# Patient Record
Sex: Male | Born: 1970
Health system: Southern US, Community
[De-identification: ages and names within clinical notes are randomized; demographics above are authoritative.]

## PROBLEM LIST (undated history)

## (undated) DIAGNOSIS — K5790 Diverticulosis of intestine, part unspecified, without perforation or abscess without bleeding: Secondary | ICD-10-CM

## (undated) DIAGNOSIS — G4733 Obstructive sleep apnea (adult) (pediatric): Secondary | ICD-10-CM

## (undated) DIAGNOSIS — I4891 Unspecified atrial fibrillation: Secondary | ICD-10-CM

## (undated) DIAGNOSIS — K449 Diaphragmatic hernia without obstruction or gangrene: Secondary | ICD-10-CM

## (undated) DIAGNOSIS — F419 Anxiety disorder, unspecified: Secondary | ICD-10-CM

## (undated) DIAGNOSIS — G8929 Other chronic pain: Secondary | ICD-10-CM

## (undated) DIAGNOSIS — D126 Benign neoplasm of colon, unspecified: Secondary | ICD-10-CM

## (undated) DIAGNOSIS — Z8719 Personal history of other diseases of the digestive system: Secondary | ICD-10-CM

## (undated) DIAGNOSIS — I2699 Other pulmonary embolism without acute cor pulmonale: Secondary | ICD-10-CM

## (undated) DIAGNOSIS — K219 Gastro-esophageal reflux disease without esophagitis: Secondary | ICD-10-CM

## (undated) DIAGNOSIS — G4730 Sleep apnea, unspecified: Secondary | ICD-10-CM

## (undated) DIAGNOSIS — K589 Irritable bowel syndrome without diarrhea: Secondary | ICD-10-CM

## (undated) DIAGNOSIS — M542 Cervicalgia: Secondary | ICD-10-CM

## (undated) DIAGNOSIS — T7840XA Allergy, unspecified, initial encounter: Secondary | ICD-10-CM

## (undated) DIAGNOSIS — M67912 Unspecified disorder of synovium and tendon, left shoulder: Secondary | ICD-10-CM

## (undated) DIAGNOSIS — M722 Plantar fascial fibromatosis: Secondary | ICD-10-CM

## (undated) DIAGNOSIS — M1990 Unspecified osteoarthritis, unspecified site: Secondary | ICD-10-CM

## (undated) DIAGNOSIS — M549 Dorsalgia, unspecified: Secondary | ICD-10-CM

## (undated) DIAGNOSIS — N200 Calculus of kidney: Secondary | ICD-10-CM

## (undated) HISTORY — DX: Calculus of kidney: N20.0

## (undated) HISTORY — DX: Diaphragmatic hernia without obstruction or gangrene: K44.9

## (undated) HISTORY — PX: SHOULDER ARTHROSCOPY: SHX128

## (undated) HISTORY — DX: Cervicalgia: M54.2

## (undated) HISTORY — DX: Plantar fascial fibromatosis: M72.2

## (undated) HISTORY — DX: Allergy, unspecified, initial encounter: T78.40XA

## (undated) HISTORY — DX: Gastro-esophageal reflux disease without esophagitis: K21.9

## (undated) HISTORY — DX: Benign neoplasm of colon, unspecified: D12.6

## (undated) HISTORY — DX: Other chronic pain: G89.29

## (undated) HISTORY — DX: Unspecified osteoarthritis, unspecified site: M19.90

## (undated) HISTORY — DX: Irritable bowel syndrome, unspecified: K58.9

## (undated) HISTORY — PX: UPPER GASTROINTESTINAL ENDOSCOPY: SHX188

## (undated) HISTORY — DX: Sleep apnea, unspecified: G47.30

## (undated) HISTORY — DX: Anxiety disorder, unspecified: F41.9

## (undated) HISTORY — DX: Personal history of other diseases of the digestive system: Z87.19

## (undated) HISTORY — PX: MANDIBLE SURGERY: SHX707

## (undated) HISTORY — PX: KIDNEY STONE SURGERY: SHX686

## (undated) HISTORY — DX: Diverticulosis of intestine, part unspecified, without perforation or abscess without bleeding: K57.90

## (undated) HISTORY — DX: Unspecified disorder of synovium and tendon, left shoulder: M67.912

## (undated) HISTORY — DX: Other pulmonary embolism without acute cor pulmonale: I26.99

## (undated) HISTORY — DX: Other chronic pain: M54.9

## (undated) HISTORY — DX: Obstructive sleep apnea (adult) (pediatric): G47.33

---

## 1997-12-08 ENCOUNTER — Emergency Department (HOSPITAL_COMMUNITY): Admission: EM | Admit: 1997-12-08 | Discharge: 1997-12-08 | Payer: Self-pay | Admitting: Emergency Medicine

## 1998-01-20 ENCOUNTER — Emergency Department (HOSPITAL_COMMUNITY): Admission: EM | Admit: 1998-01-20 | Discharge: 1998-01-20 | Payer: Self-pay | Admitting: Emergency Medicine

## 1998-03-18 ENCOUNTER — Encounter: Admission: RE | Admit: 1998-03-18 | Discharge: 1998-03-18 | Payer: Self-pay | Admitting: Family Medicine

## 1998-03-22 ENCOUNTER — Encounter: Admission: RE | Admit: 1998-03-22 | Discharge: 1998-03-22 | Payer: Self-pay | Admitting: Sports Medicine

## 1998-08-18 ENCOUNTER — Emergency Department (HOSPITAL_COMMUNITY): Admission: EM | Admit: 1998-08-18 | Discharge: 1998-08-18 | Payer: Self-pay | Admitting: Emergency Medicine

## 1998-09-26 ENCOUNTER — Encounter: Admission: RE | Admit: 1998-09-26 | Discharge: 1998-09-26 | Payer: Self-pay | Admitting: Family Medicine

## 1999-11-30 ENCOUNTER — Encounter: Payer: Self-pay | Admitting: Emergency Medicine

## 1999-11-30 ENCOUNTER — Emergency Department (HOSPITAL_COMMUNITY): Admission: EM | Admit: 1999-11-30 | Discharge: 1999-11-30 | Payer: Self-pay | Admitting: Emergency Medicine

## 2000-04-29 ENCOUNTER — Emergency Department (HOSPITAL_COMMUNITY): Admission: EM | Admit: 2000-04-29 | Discharge: 2000-04-29 | Payer: Self-pay | Admitting: Emergency Medicine

## 2000-04-29 ENCOUNTER — Encounter: Payer: Self-pay | Admitting: Emergency Medicine

## 2000-08-09 ENCOUNTER — Encounter: Admission: RE | Admit: 2000-08-09 | Discharge: 2000-08-09 | Payer: Self-pay | Admitting: Family Medicine

## 2000-12-09 ENCOUNTER — Encounter: Admission: RE | Admit: 2000-12-09 | Discharge: 2000-12-09 | Payer: Self-pay | Admitting: Family Medicine

## 2001-02-10 ENCOUNTER — Encounter: Admission: RE | Admit: 2001-02-10 | Discharge: 2001-02-10 | Payer: Self-pay | Admitting: Family Medicine

## 2001-02-25 ENCOUNTER — Encounter: Admission: RE | Admit: 2001-02-25 | Discharge: 2001-02-25 | Payer: Self-pay | Admitting: Family Medicine

## 2001-06-13 ENCOUNTER — Encounter: Admission: RE | Admit: 2001-06-13 | Discharge: 2001-06-13 | Payer: Self-pay | Admitting: Family Medicine

## 2001-06-19 ENCOUNTER — Emergency Department (HOSPITAL_COMMUNITY): Admission: EM | Admit: 2001-06-19 | Discharge: 2001-06-19 | Payer: Self-pay | Admitting: Emergency Medicine

## 2001-06-30 ENCOUNTER — Encounter: Admission: RE | Admit: 2001-06-30 | Discharge: 2001-06-30 | Payer: Self-pay | Admitting: Family Medicine

## 2001-07-03 ENCOUNTER — Encounter: Admission: RE | Admit: 2001-07-03 | Discharge: 2001-07-03 | Payer: Self-pay | Admitting: Family Medicine

## 2001-07-07 ENCOUNTER — Encounter: Admission: RE | Admit: 2001-07-07 | Discharge: 2001-07-07 | Payer: Self-pay | Admitting: Sports Medicine

## 2001-07-07 ENCOUNTER — Encounter: Payer: Self-pay | Admitting: Sports Medicine

## 2001-11-27 ENCOUNTER — Encounter: Admission: RE | Admit: 2001-11-27 | Discharge: 2001-11-27 | Payer: Self-pay | Admitting: Family Medicine

## 2001-12-25 ENCOUNTER — Encounter: Admission: RE | Admit: 2001-12-25 | Discharge: 2001-12-25 | Payer: Self-pay | Admitting: Family Medicine

## 2001-12-31 ENCOUNTER — Encounter: Admission: RE | Admit: 2001-12-31 | Discharge: 2001-12-31 | Payer: Self-pay | Admitting: Sports Medicine

## 2001-12-31 ENCOUNTER — Encounter: Payer: Self-pay | Admitting: Sports Medicine

## 2002-01-09 ENCOUNTER — Encounter: Admission: RE | Admit: 2002-01-09 | Discharge: 2002-01-09 | Payer: Self-pay | Admitting: Family Medicine

## 2002-03-16 ENCOUNTER — Encounter: Admission: RE | Admit: 2002-03-16 | Discharge: 2002-03-16 | Payer: Self-pay | Admitting: Podiatry

## 2002-08-21 ENCOUNTER — Encounter: Admission: RE | Admit: 2002-08-21 | Discharge: 2002-08-21 | Payer: Self-pay | Admitting: Family Medicine

## 2002-08-23 ENCOUNTER — Emergency Department (HOSPITAL_COMMUNITY): Admission: EM | Admit: 2002-08-23 | Discharge: 2002-08-23 | Payer: Self-pay | Admitting: Emergency Medicine

## 2002-10-08 ENCOUNTER — Emergency Department (HOSPITAL_COMMUNITY): Admission: EM | Admit: 2002-10-08 | Discharge: 2002-10-08 | Payer: Self-pay | Admitting: Emergency Medicine

## 2003-01-13 ENCOUNTER — Encounter: Admission: RE | Admit: 2003-01-13 | Discharge: 2003-01-13 | Payer: Self-pay | Admitting: Family Medicine

## 2003-05-09 ENCOUNTER — Emergency Department (HOSPITAL_COMMUNITY): Admission: EM | Admit: 2003-05-09 | Discharge: 2003-05-10 | Payer: Self-pay | Admitting: Emergency Medicine

## 2003-06-22 ENCOUNTER — Encounter: Admission: RE | Admit: 2003-06-22 | Discharge: 2003-06-22 | Payer: Self-pay | Admitting: Family Medicine

## 2003-07-30 ENCOUNTER — Encounter: Admission: RE | Admit: 2003-07-30 | Discharge: 2003-07-30 | Payer: Self-pay | Admitting: Family Medicine

## 2003-10-25 ENCOUNTER — Emergency Department (HOSPITAL_COMMUNITY): Admission: EM | Admit: 2003-10-25 | Discharge: 2003-10-26 | Payer: Self-pay | Admitting: Emergency Medicine

## 2003-10-27 ENCOUNTER — Emergency Department (HOSPITAL_COMMUNITY): Admission: EM | Admit: 2003-10-27 | Discharge: 2003-10-27 | Payer: Self-pay | Admitting: Emergency Medicine

## 2003-12-05 ENCOUNTER — Ambulatory Visit (HOSPITAL_COMMUNITY)
Admission: RE | Admit: 2003-12-05 | Discharge: 2003-12-05 | Payer: Self-pay | Admitting: Physical Medicine and Rehabilitation

## 2004-01-13 ENCOUNTER — Encounter: Admission: RE | Admit: 2004-01-13 | Discharge: 2004-01-13 | Payer: Self-pay | Admitting: Family Medicine

## 2004-12-04 ENCOUNTER — Ambulatory Visit: Payer: Self-pay | Admitting: Sports Medicine

## 2004-12-08 ENCOUNTER — Ambulatory Visit: Payer: Self-pay | Admitting: Family Medicine

## 2005-05-28 HISTORY — PX: COLONOSCOPY: SHX174

## 2005-05-29 ENCOUNTER — Emergency Department (HOSPITAL_COMMUNITY): Admission: EM | Admit: 2005-05-29 | Discharge: 2005-05-29 | Payer: Self-pay | Admitting: Emergency Medicine

## 2005-08-02 ENCOUNTER — Emergency Department (HOSPITAL_COMMUNITY): Admission: EM | Admit: 2005-08-02 | Discharge: 2005-08-02 | Payer: Self-pay | Admitting: Emergency Medicine

## 2005-08-23 ENCOUNTER — Ambulatory Visit: Payer: Self-pay | Admitting: Family Medicine

## 2005-09-13 ENCOUNTER — Emergency Department (HOSPITAL_COMMUNITY): Admission: EM | Admit: 2005-09-13 | Discharge: 2005-09-14 | Payer: Self-pay | Admitting: Emergency Medicine

## 2006-01-23 ENCOUNTER — Encounter: Admission: RE | Admit: 2006-01-23 | Discharge: 2006-01-23 | Payer: Self-pay | Admitting: Orthopaedic Surgery

## 2006-01-29 ENCOUNTER — Ambulatory Visit: Payer: Self-pay | Admitting: Gastroenterology

## 2006-02-10 ENCOUNTER — Emergency Department (HOSPITAL_COMMUNITY): Admission: EM | Admit: 2006-02-10 | Discharge: 2006-02-11 | Payer: Self-pay | Admitting: Emergency Medicine

## 2006-02-26 ENCOUNTER — Encounter (INDEPENDENT_AMBULATORY_CARE_PROVIDER_SITE_OTHER): Payer: Self-pay | Admitting: *Deleted

## 2006-02-26 ENCOUNTER — Ambulatory Visit: Payer: Self-pay | Admitting: Gastroenterology

## 2006-06-03 ENCOUNTER — Ambulatory Visit: Payer: Self-pay

## 2006-07-25 ENCOUNTER — Encounter: Admission: RE | Admit: 2006-07-25 | Discharge: 2006-07-25 | Payer: Self-pay | Admitting: Orthopaedic Surgery

## 2006-07-25 DIAGNOSIS — M1990 Unspecified osteoarthritis, unspecified site: Secondary | ICD-10-CM | POA: Insufficient documentation

## 2006-08-12 ENCOUNTER — Ambulatory Visit: Payer: Self-pay | Admitting: Sports Medicine

## 2006-08-12 ENCOUNTER — Telehealth: Payer: Self-pay | Admitting: *Deleted

## 2006-08-29 ENCOUNTER — Emergency Department (HOSPITAL_COMMUNITY): Admission: EM | Admit: 2006-08-29 | Discharge: 2006-08-29 | Payer: Self-pay | Admitting: Emergency Medicine

## 2006-08-30 ENCOUNTER — Telehealth: Payer: Self-pay | Admitting: *Deleted

## 2006-09-01 ENCOUNTER — Emergency Department (HOSPITAL_COMMUNITY): Admission: EM | Admit: 2006-09-01 | Discharge: 2006-09-02 | Payer: Self-pay | Admitting: Emergency Medicine

## 2006-09-03 ENCOUNTER — Encounter (INDEPENDENT_AMBULATORY_CARE_PROVIDER_SITE_OTHER): Payer: Self-pay | Admitting: Family Medicine

## 2006-09-03 ENCOUNTER — Ambulatory Visit: Payer: Self-pay | Admitting: Family Medicine

## 2006-09-04 ENCOUNTER — Emergency Department (HOSPITAL_COMMUNITY): Admission: EM | Admit: 2006-09-04 | Discharge: 2006-09-04 | Payer: Self-pay | Admitting: Emergency Medicine

## 2006-09-11 ENCOUNTER — Ambulatory Visit: Payer: Self-pay | Admitting: Vascular Surgery

## 2006-09-11 ENCOUNTER — Inpatient Hospital Stay (HOSPITAL_COMMUNITY): Admission: RE | Admit: 2006-09-11 | Discharge: 2006-09-11 | Payer: Self-pay | Admitting: Family Medicine

## 2006-09-11 ENCOUNTER — Encounter (INDEPENDENT_AMBULATORY_CARE_PROVIDER_SITE_OTHER): Payer: Self-pay | Admitting: Family Medicine

## 2006-09-11 ENCOUNTER — Telehealth (INDEPENDENT_AMBULATORY_CARE_PROVIDER_SITE_OTHER): Payer: Self-pay | Admitting: *Deleted

## 2006-09-18 ENCOUNTER — Telehealth: Payer: Self-pay | Admitting: *Deleted

## 2007-02-24 ENCOUNTER — Telehealth (INDEPENDENT_AMBULATORY_CARE_PROVIDER_SITE_OTHER): Payer: Self-pay | Admitting: *Deleted

## 2007-02-25 ENCOUNTER — Ambulatory Visit: Payer: Self-pay | Admitting: Internal Medicine

## 2007-02-25 LAB — CONVERTED CEMR LAB
ALT: 41 units/L (ref 0–53)
Albumin: 4.1 g/dL (ref 3.5–5.2)
Alkaline Phosphatase: 43 units/L (ref 39–117)
Basophils Absolute: 0 10*3/uL (ref 0.0–0.1)
Eosinophils Absolute: 0.1 10*3/uL (ref 0.0–0.6)
MCHC: 34 g/dL (ref 30.0–36.0)
MCV: 89 fL (ref 78.0–100.0)
Monocytes Relative: 11.3 % — ABNORMAL HIGH (ref 3.0–11.0)
Platelets: 326 10*3/uL (ref 150–400)
RBC: 4.84 M/uL (ref 4.22–5.81)
WBC: 5.7 10*3/uL (ref 4.5–10.5)

## 2007-02-27 ENCOUNTER — Ambulatory Visit (HOSPITAL_COMMUNITY): Admission: RE | Admit: 2007-02-27 | Discharge: 2007-02-27 | Payer: Self-pay | Admitting: Internal Medicine

## 2007-05-05 ENCOUNTER — Encounter: Payer: Self-pay | Admitting: *Deleted

## 2007-05-13 ENCOUNTER — Ambulatory Visit: Payer: Self-pay | Admitting: Sports Medicine

## 2007-06-16 ENCOUNTER — Ambulatory Visit: Payer: Self-pay | Admitting: Sports Medicine

## 2007-06-16 ENCOUNTER — Ambulatory Visit (HOSPITAL_COMMUNITY): Admission: RE | Admit: 2007-06-16 | Discharge: 2007-06-16 | Payer: Self-pay | Admitting: Sports Medicine

## 2007-06-16 ENCOUNTER — Telehealth (INDEPENDENT_AMBULATORY_CARE_PROVIDER_SITE_OTHER): Payer: Self-pay | Admitting: *Deleted

## 2007-06-25 ENCOUNTER — Encounter (INDEPENDENT_AMBULATORY_CARE_PROVIDER_SITE_OTHER): Payer: Self-pay | Admitting: Family Medicine

## 2007-07-11 ENCOUNTER — Encounter: Admission: RE | Admit: 2007-07-11 | Discharge: 2007-07-11 | Payer: Self-pay | Admitting: Orthopaedic Surgery

## 2007-07-13 ENCOUNTER — Encounter: Admission: RE | Admit: 2007-07-13 | Discharge: 2007-07-13 | Payer: Self-pay | Admitting: Orthopaedic Surgery

## 2007-08-05 DIAGNOSIS — K449 Diaphragmatic hernia without obstruction or gangrene: Secondary | ICD-10-CM | POA: Insufficient documentation

## 2007-08-05 DIAGNOSIS — K5730 Diverticulosis of large intestine without perforation or abscess without bleeding: Secondary | ICD-10-CM | POA: Insufficient documentation

## 2007-08-05 DIAGNOSIS — D126 Benign neoplasm of colon, unspecified: Secondary | ICD-10-CM

## 2007-08-05 HISTORY — DX: Benign neoplasm of colon, unspecified: D12.6

## 2007-08-26 ENCOUNTER — Ambulatory Visit: Payer: Self-pay | Admitting: Gastroenterology

## 2007-12-15 ENCOUNTER — Telehealth: Payer: Self-pay | Admitting: *Deleted

## 2007-12-15 ENCOUNTER — Ambulatory Visit: Payer: Self-pay | Admitting: Family Medicine

## 2007-12-15 DIAGNOSIS — Z8719 Personal history of other diseases of the digestive system: Secondary | ICD-10-CM

## 2007-12-15 HISTORY — DX: Personal history of other diseases of the digestive system: Z87.19

## 2007-12-18 ENCOUNTER — Telehealth: Payer: Self-pay | Admitting: Family Medicine

## 2007-12-19 ENCOUNTER — Ambulatory Visit: Payer: Self-pay | Admitting: Family Medicine

## 2008-03-03 ENCOUNTER — Encounter: Payer: Self-pay | Admitting: Family Medicine

## 2008-03-03 ENCOUNTER — Ambulatory Visit: Payer: Self-pay | Admitting: Family Medicine

## 2008-03-03 LAB — CONVERTED CEMR LAB
Bilirubin Urine: NEGATIVE
Glucose, Urine, Semiquant: NEGATIVE
Protein, U semiquant: NEGATIVE
Specific Gravity, Urine: 1.025
pH: 5.5

## 2008-03-10 LAB — CONVERTED CEMR LAB
AST: 22 units/L (ref 0–37)
BUN: 13 mg/dL (ref 6–23)
Calcium: 9.2 mg/dL (ref 8.4–10.5)
Chloride: 104 meq/L (ref 96–112)
Creatinine, Ser: 1.02 mg/dL (ref 0.40–1.50)
Glucose, Bld: 75 mg/dL (ref 70–99)

## 2008-05-27 ENCOUNTER — Telehealth (INDEPENDENT_AMBULATORY_CARE_PROVIDER_SITE_OTHER): Payer: Self-pay | Admitting: Family Medicine

## 2008-05-27 ENCOUNTER — Telehealth: Payer: Self-pay | Admitting: Gastroenterology

## 2008-05-27 ENCOUNTER — Emergency Department (HOSPITAL_COMMUNITY): Admission: EM | Admit: 2008-05-27 | Discharge: 2008-05-27 | Payer: Self-pay | Admitting: Emergency Medicine

## 2008-06-08 ENCOUNTER — Telehealth: Payer: Self-pay | Admitting: Gastroenterology

## 2008-06-08 ENCOUNTER — Ambulatory Visit: Payer: Self-pay | Admitting: Gastroenterology

## 2008-06-08 DIAGNOSIS — K219 Gastro-esophageal reflux disease without esophagitis: Secondary | ICD-10-CM

## 2008-06-09 ENCOUNTER — Ambulatory Visit: Payer: Self-pay | Admitting: Gastroenterology

## 2008-06-22 ENCOUNTER — Emergency Department (HOSPITAL_COMMUNITY): Admission: EM | Admit: 2008-06-22 | Discharge: 2008-06-22 | Payer: Self-pay | Admitting: Emergency Medicine

## 2008-06-22 ENCOUNTER — Telehealth: Payer: Self-pay | Admitting: Family Medicine

## 2008-07-13 ENCOUNTER — Ambulatory Visit: Payer: Self-pay | Admitting: Gastroenterology

## 2008-07-13 DIAGNOSIS — K589 Irritable bowel syndrome without diarrhea: Secondary | ICD-10-CM

## 2008-07-14 ENCOUNTER — Telehealth: Payer: Self-pay | Admitting: Gastroenterology

## 2008-07-19 ENCOUNTER — Telehealth: Payer: Self-pay | Admitting: Gastroenterology

## 2008-07-20 ENCOUNTER — Ambulatory Visit (HOSPITAL_COMMUNITY): Admission: RE | Admit: 2008-07-20 | Discharge: 2008-07-20 | Payer: Self-pay | Admitting: Gastroenterology

## 2008-09-14 ENCOUNTER — Telehealth: Payer: Self-pay | Admitting: Family Medicine

## 2008-09-17 ENCOUNTER — Telehealth (INDEPENDENT_AMBULATORY_CARE_PROVIDER_SITE_OTHER): Payer: Self-pay | Admitting: Family Medicine

## 2008-09-17 ENCOUNTER — Emergency Department (HOSPITAL_COMMUNITY): Admission: EM | Admit: 2008-09-17 | Discharge: 2008-09-18 | Payer: Self-pay | Admitting: Emergency Medicine

## 2008-09-27 ENCOUNTER — Telehealth: Payer: Self-pay | Admitting: Gastroenterology

## 2008-10-01 ENCOUNTER — Telehealth: Payer: Self-pay | Admitting: Gastroenterology

## 2008-10-06 ENCOUNTER — Encounter: Payer: Self-pay | Admitting: Family Medicine

## 2008-10-11 ENCOUNTER — Emergency Department (HOSPITAL_COMMUNITY): Admission: EM | Admit: 2008-10-11 | Discharge: 2008-10-11 | Payer: Self-pay | Admitting: Emergency Medicine

## 2008-10-26 ENCOUNTER — Encounter: Admission: RE | Admit: 2008-10-26 | Discharge: 2008-10-26 | Payer: Self-pay | Admitting: Orthopaedic Surgery

## 2008-10-29 ENCOUNTER — Encounter: Admission: RE | Admit: 2008-10-29 | Discharge: 2008-10-29 | Payer: Self-pay | Admitting: Orthopaedic Surgery

## 2008-11-01 ENCOUNTER — Ambulatory Visit: Payer: Self-pay | Admitting: Gastroenterology

## 2008-11-17 ENCOUNTER — Encounter: Admission: RE | Admit: 2008-11-17 | Discharge: 2009-01-03 | Payer: Self-pay | Admitting: Orthopaedic Surgery

## 2008-11-30 ENCOUNTER — Telehealth: Payer: Self-pay | Admitting: Family Medicine

## 2009-01-15 ENCOUNTER — Encounter: Payer: Self-pay | Admitting: Family Medicine

## 2009-01-15 ENCOUNTER — Inpatient Hospital Stay (HOSPITAL_COMMUNITY): Admission: EM | Admit: 2009-01-15 | Discharge: 2009-01-16 | Payer: Self-pay | Admitting: Emergency Medicine

## 2009-01-15 ENCOUNTER — Ambulatory Visit: Payer: Self-pay | Admitting: Family Medicine

## 2009-01-16 LAB — CONVERTED CEMR LAB
HCT: 41.7 %
Platelets: 261 10*3/uL

## 2009-01-19 ENCOUNTER — Ambulatory Visit: Payer: Self-pay | Admitting: Family Medicine

## 2009-01-19 DIAGNOSIS — M5412 Radiculopathy, cervical region: Secondary | ICD-10-CM | POA: Insufficient documentation

## 2009-01-19 LAB — CONVERTED CEMR LAB
Chloride: 109 meq/L
Creatinine, Ser: 1.09 mg/dL
Potassium: 3.9 meq/L
Sodium: 139 meq/L

## 2009-01-26 ENCOUNTER — Ambulatory Visit: Admission: RE | Admit: 2009-01-26 | Discharge: 2009-01-26 | Payer: Self-pay | Admitting: Sports Medicine

## 2009-01-26 ENCOUNTER — Ambulatory Visit: Payer: Self-pay | Admitting: Sports Medicine

## 2009-01-26 ENCOUNTER — Ambulatory Visit (HOSPITAL_COMMUNITY): Admission: RE | Admit: 2009-01-26 | Discharge: 2009-01-26 | Payer: Self-pay | Admitting: Sports Medicine

## 2009-02-03 ENCOUNTER — Ambulatory Visit: Payer: Self-pay | Admitting: Family Medicine

## 2009-02-03 DIAGNOSIS — F411 Generalized anxiety disorder: Secondary | ICD-10-CM | POA: Insufficient documentation

## 2009-02-08 ENCOUNTER — Ambulatory Visit: Payer: Self-pay | Admitting: Gastroenterology

## 2009-03-11 ENCOUNTER — Encounter: Payer: Self-pay | Admitting: Family Medicine

## 2009-03-11 ENCOUNTER — Ambulatory Visit: Payer: Self-pay | Admitting: Family Medicine

## 2009-03-11 LAB — CONVERTED CEMR LAB
Glucose, Urine, Semiquant: NEGATIVE
Nitrite: NEGATIVE
Protein, U semiquant: NEGATIVE
Specific Gravity, Urine: 1.025
Urobilinogen, UA: 0.2
WBC Urine, dipstick: NEGATIVE
pH: 5.5

## 2009-03-14 ENCOUNTER — Ambulatory Visit: Payer: Self-pay | Admitting: Family Medicine

## 2009-03-14 ENCOUNTER — Encounter: Admission: RE | Admit: 2009-03-14 | Discharge: 2009-03-14 | Payer: Self-pay | Admitting: Family Medicine

## 2009-03-14 ENCOUNTER — Encounter: Payer: Self-pay | Admitting: Family Medicine

## 2009-03-15 ENCOUNTER — Telehealth: Payer: Self-pay | Admitting: Family Medicine

## 2009-03-15 LAB — CONVERTED CEMR LAB
ALT: 40 units/L (ref 0–53)
AST: 20 units/L (ref 0–37)
BUN: 14 mg/dL (ref 6–23)
Calcium: 9.8 mg/dL (ref 8.4–10.5)
Chloride: 106 meq/L (ref 96–112)
Creatinine, Ser: 1.04 mg/dL (ref 0.40–1.50)
GGT: 30 units/L (ref 7–51)
HCT: 45.3 % (ref 39.0–52.0)
HDL: 35 mg/dL — ABNORMAL LOW (ref >39)
Hemoglobin: 14.6 g/dL (ref 13.0–17.0)
MCHC: 32.2 g/dL (ref 30.0–36.0)
MCV: 91.7 fL (ref 78.0–100.0)
Platelets: 316 10*3/uL (ref 150–400)
RDW: 13.2 % (ref 11.5–15.5)
Total Bilirubin: 0.5 mg/dL (ref 0.3–1.2)
Total CHOL/HDL Ratio: 4.9
VLDL: 25 mg/dL (ref 0–40)

## 2009-03-17 ENCOUNTER — Encounter: Payer: Self-pay | Admitting: Family Medicine

## 2009-04-05 ENCOUNTER — Encounter: Payer: Self-pay | Admitting: Family Medicine

## 2009-04-28 ENCOUNTER — Encounter: Payer: Self-pay | Admitting: Family Medicine

## 2009-04-29 ENCOUNTER — Encounter: Admission: RE | Admit: 2009-04-29 | Discharge: 2009-04-29 | Payer: Self-pay | Admitting: Family Medicine

## 2009-05-04 ENCOUNTER — Ambulatory Visit: Payer: Self-pay

## 2009-05-12 ENCOUNTER — Telehealth: Payer: Self-pay | Admitting: Family Medicine

## 2009-06-21 ENCOUNTER — Emergency Department (HOSPITAL_BASED_OUTPATIENT_CLINIC_OR_DEPARTMENT_OTHER): Admission: EM | Admit: 2009-06-21 | Discharge: 2009-06-21 | Payer: Self-pay | Admitting: Emergency Medicine

## 2009-06-21 ENCOUNTER — Encounter (INDEPENDENT_AMBULATORY_CARE_PROVIDER_SITE_OTHER): Payer: Self-pay | Admitting: *Deleted

## 2009-06-21 ENCOUNTER — Ambulatory Visit: Payer: Self-pay | Admitting: Family Medicine

## 2009-06-24 ENCOUNTER — Telehealth: Payer: Self-pay | Admitting: Family Medicine

## 2009-07-19 ENCOUNTER — Ambulatory Visit: Payer: Self-pay | Admitting: Gastroenterology

## 2009-08-11 ENCOUNTER — Encounter (INDEPENDENT_AMBULATORY_CARE_PROVIDER_SITE_OTHER): Payer: Self-pay | Admitting: *Deleted

## 2009-09-12 ENCOUNTER — Ambulatory Visit: Payer: Self-pay | Admitting: Gastroenterology

## 2009-09-13 ENCOUNTER — Telehealth: Payer: Self-pay | Admitting: Family Medicine

## 2009-09-15 ENCOUNTER — Ambulatory Visit: Payer: Self-pay | Admitting: Family Medicine

## 2009-09-15 DIAGNOSIS — R42 Dizziness and giddiness: Secondary | ICD-10-CM

## 2009-10-18 ENCOUNTER — Telehealth: Payer: Self-pay | Admitting: Family Medicine

## 2009-10-29 ENCOUNTER — Emergency Department (HOSPITAL_COMMUNITY): Admission: EM | Admit: 2009-10-29 | Discharge: 2009-10-29 | Payer: Self-pay | Admitting: Emergency Medicine

## 2009-11-13 ENCOUNTER — Encounter: Admission: RE | Admit: 2009-11-13 | Discharge: 2009-11-13 | Payer: Self-pay | Admitting: Orthopaedic Surgery

## 2009-12-14 ENCOUNTER — Telehealth: Payer: Self-pay | Admitting: Family Medicine

## 2010-01-02 ENCOUNTER — Encounter: Payer: Self-pay | Admitting: Sports Medicine

## 2010-01-02 ENCOUNTER — Telehealth: Payer: Self-pay | Admitting: Family Medicine

## 2010-01-02 ENCOUNTER — Ambulatory Visit: Payer: Self-pay | Admitting: Family Medicine

## 2010-01-02 LAB — CONVERTED CEMR LAB
ALT: 32 U/L (ref 0–53)
AST: 19 units/L (ref 0–37)
Albumin: 4.8 g/dL (ref 3.5–5.2)
Alkaline Phosphatase: 47 units/L (ref 39–117)
Bilirubin, Direct: 0.1 mg/dL (ref 0.0–0.3)
Hep B S Ab: NEGATIVE
Indirect Bilirubin: 0.3 mg/dL (ref 0.0–0.9)
Total Bilirubin: 0.4 mg/dL (ref 0.3–1.2)
Total Protein: 7.3 g/dL (ref 6.0–8.3)

## 2010-01-03 ENCOUNTER — Encounter: Payer: Self-pay | Admitting: Sports Medicine

## 2010-01-03 LAB — CONVERTED CEMR LAB
HCV Ab: NEGATIVE
Hep A IgM: NEGATIVE
Hep B C IgM: NEGATIVE
Hepatitis B Surface Ag: NEGATIVE

## 2010-01-04 ENCOUNTER — Telehealth: Payer: Self-pay | Admitting: Sports Medicine

## 2010-01-06 ENCOUNTER — Ambulatory Visit: Payer: Self-pay | Admitting: Family Medicine

## 2010-01-06 DIAGNOSIS — R079 Chest pain, unspecified: Secondary | ICD-10-CM

## 2010-01-06 DIAGNOSIS — B360 Pityriasis versicolor: Secondary | ICD-10-CM | POA: Insufficient documentation

## 2010-01-26 ENCOUNTER — Ambulatory Visit: Payer: Self-pay | Admitting: Family Medicine

## 2010-01-26 DIAGNOSIS — R109 Unspecified abdominal pain: Secondary | ICD-10-CM | POA: Insufficient documentation

## 2010-01-31 ENCOUNTER — Telehealth: Payer: Self-pay | Admitting: Family Medicine

## 2010-02-01 ENCOUNTER — Telehealth: Payer: Self-pay | Admitting: Family Medicine

## 2010-02-13 ENCOUNTER — Encounter: Payer: Self-pay | Admitting: Sports Medicine

## 2010-02-13 ENCOUNTER — Ambulatory Visit: Payer: Self-pay | Admitting: Family Medicine

## 2010-02-13 ENCOUNTER — Encounter: Payer: Self-pay | Admitting: Family Medicine

## 2010-02-15 LAB — CONVERTED CEMR LAB
HCV Ab: NEGATIVE
Hep A IgM: NEGATIVE
Hep B C IgM: NEGATIVE
Hep B S Ab: NEGATIVE
Hepatitis B Surface Ag: NEGATIVE

## 2010-03-06 ENCOUNTER — Ambulatory Visit: Payer: Self-pay | Admitting: Gastroenterology

## 2010-04-24 ENCOUNTER — Ambulatory Visit: Payer: Self-pay | Admitting: Gastroenterology

## 2010-05-09 ENCOUNTER — Encounter
Admission: RE | Admit: 2010-05-09 | Discharge: 2010-05-09 | Payer: Self-pay | Source: Home / Self Care | Attending: Orthopaedic Surgery | Admitting: Orthopaedic Surgery

## 2010-05-26 ENCOUNTER — Ambulatory Visit: Admission: RE | Admit: 2010-05-26 | Discharge: 2010-05-26 | Payer: Self-pay | Source: Home / Self Care

## 2010-05-26 LAB — CONVERTED CEMR LAB
Nitrite: NEGATIVE
Specific Gravity, Urine: 1.025
Urobilinogen, UA: 0.2
WBC Urine, dipstick: NEGATIVE

## 2010-06-18 ENCOUNTER — Encounter: Payer: Self-pay | Admitting: Family Medicine

## 2010-06-23 ENCOUNTER — Telehealth: Payer: Self-pay | Admitting: *Deleted

## 2010-06-26 ENCOUNTER — Encounter: Payer: Self-pay | Admitting: Family Medicine

## 2010-06-26 ENCOUNTER — Ambulatory Visit: Admission: RE | Admit: 2010-06-26 | Discharge: 2010-06-26 | Payer: Self-pay | Source: Home / Self Care

## 2010-06-26 LAB — CONVERTED CEMR LAB
BUN: 16 mg/dL (ref 6–23)
CO2: 25 meq/L (ref 19–32)
CO2: 25 meq/L (ref 19–32)
Calcium: 9.9 mg/dL (ref 8.4–10.5)
Chloride: 105 meq/L (ref 96–112)
Chloride: 105 meq/L (ref 96–112)
Creatinine, Ser: 1.11 mg/dL (ref 0.40–1.50)
Glucose, Bld: 98 mg/dL (ref 70–99)
Glucose, Bld: 98 mg/dL (ref 70–99)
Hemoglobin: 15.5 g/dL
Potassium: 4.1 meq/L (ref 3.5–5.3)
Sodium: 141 meq/L (ref 135–145)

## 2010-06-28 ENCOUNTER — Telehealth: Payer: Self-pay | Admitting: Family Medicine

## 2010-06-29 NOTE — Progress Notes (Signed)
Summary: Tessalon perles 200mg  tid prn #30 x0  Medications Added TESSALON PERLES 100 MG CAPS (BENZONATATE) 2 tabs by mouth three times a day as needed cough       Phone Note Call from Patient Call back at [phone]   Caller: Spouse-Kim Summary of Call: been taking Musinex and it is not doing anything - coughing up green mucus - wants something called to [facility] [place] Initial call taken by: De Nurse,  June 24, 2009 8:47 AM  Follow-up for Phone Call        cough X2 weeks, saw Dr L on Tues and does not what to come back if possible, now coughing up green mucus started on Wed, throat sore still from drainage, not sleeping, using mucinex, cough drops, warm tea.  Suggested warm salt gargle, honey.  Thinks he needs an antibiotic and would like it sent to  [facility] [place] if you could.  Explained would send message to PCP and let him know what the PCP suggests Follow-up by: Gladstone Pih,  June 24, 2009 9:28 AM  Additional Follow-up for Phone Call Additional follow up Details #1::        Called pt back and left message that I don't think an antibiotic would be very helpful at this time.  Instead, I'll send him a cough suppressant to the pharmacy. Additional Follow-up by: Marisue Ivan  MD,  June 24, 2009 11:29 AM    New/Updated Medications: TESSALON PERLES 100 MG CAPS (BENZONATATE) 2 tabs by mouth three times a day as needed cough Prescriptions: TESSALON PERLES 100 MG CAPS (BENZONATATE) 2 tabs by mouth three times a day as needed cough  #30 x 0   Entered and Authorized by:   Marisue Ivan  MD   Signed by:   Marisue Ivan  MD on 06/24/2009   Method used:   Electronically to        [facility] [address] [phone]* (retail)       [address] [place], [place]  [address]       Ph: [phone]       Fax: [phone]   RxID:   [id number]

## 2010-06-29 NOTE — Assessment & Plan Note (Signed)
Summary: needlestick/Noank/walden   Vital Signs:  Patient profile:   40 year old male Weight:      217.9 pounds Temp:     98.7 degrees F oral Pulse rate:   67 / minute BP sitting:   116 / 79  (right arm) Cuff size:   large  Vitals Entered By: Loralee Pacas CMA (January 02, 2010 4:25 PM)  Primary Care Provider:  Renold Don MD   History of Present Illness: 40yo male, was helpnig grandfather with insulin yesterday and accidentally stuck self with needle after administering insulin.   Stuck in R index finger.  No blood seen on syringe.  Syringe was laying out for approx 2 mins.  Event occurred 01/01/10 at 11am.  Grandfather has unknown HIV/HepB/HepC status, no hx IVDU, no known risky sexual behaviors, no symptoms of liver or immune disease.  Patient does not wish to have grandfather brought in for testing (feels grandfather will feel as though pt does not trust him).  Pt does not know his hepB immunization status.    Current Medications (verified): 1)  Econazole Nitrate 1 %  Crea (Econazole Nitrate) .... Apply To Affected Area Two Times A Day. Dispense 60 Grams 2)  Omeprazole 20 Mg Cpdr (Omeprazole) .... One Tablet By Mouth One To Two Times A Day As Needed For Reflux Symptoms 3)  Celexa 20 Mg Tabs (Citalopram Hydrobromide) .[name].. 1 Tab By Mouth Daily For Anxiety and Depression 4)  Tylenol Extra Strength 500 Mg Tabs (Acetaminophen) .... 2 Tabs By Mouth Three Times A Day Scheduled For Chronic Back Pain 5)  Oxycodone Hcl 5 Mg Tabs (Oxycodone Hcl) .[name].. 1 Tab By Mouth Q4h As Needed For Breakthrough Pain 6)  Flexeril 10 Mg Tabs (Cyclobenzaprine Hcl) .[name].. 1 Tab By Mouth At Bedtime As Needed For Muscle Spasms  Allergies (verified): 1)  Clarithromycin (Clarithromycin) 2)  * Gabapentin  Past History:  Past Medical History: Needle stick injury 01/01/10, currently checking HepB/HepC/HIV at 6wk, 60mo, 2mo post-exposure.  PPx not indicated.  Grade A erosive esophagitis Hiatal  hernia GERD IBS Diverticulosis Kidney Stones-2005 Chronic neck and back pain  Review of Systems       See HPI  Physical Exam  General:  Well-developed,well-nourished,in no acute distress; alert,appropriate and cooperative throughout examination Eyes:  No corneal or conjunctival inflammation noted. EOMI. Perrla Lungs:  Normal respiratory effort, chest expands symmetrically. Lungs are clear to auscultation, no crackles or wheezes. Heart:  Normal rate and regular rhythm. S1 and S2 normal without gallop, murmur, click, rub or other extra sounds. Abdomen:  Bowel sounds positive,abdomen soft and non-tender without masses, organomegaly or hernias noted. Skin:  Warm, dry.  No jaundice. Cervical Nodes:  No lymphadenopathy noted Axillary Nodes:  No palpable lymphadenopathy Inguinal Nodes:  No significant adenopathy Psych:  Nervous appearing.   Impression & Recommendations:  Problem # 1:  CONTACT OR EXPOSURE TO OTHER VIRAL DISEASES (ICD-V01.79) Assessment New Post-exposure prophylaxis not indicated in this low-risk esposure.  Will check Acute hep panel, HIV, HepBsAb.  Will repeat at 6 wks, 3 months, 6 months.  WIll call pt with results.  Handout given.  he is also aware of acute viremia symptoms of HepB, HepC, and HIV and will present if develops any of these symptoms.  Future orders entered for 6wk, 60mo, and 2mo repeat labs.  Pt to make appt at front for lab appts.  Would prefer grandfather not know about this.  Orders: HIV-[facility] ([id number]) Hep Bs Ab-[facility] [id number]) Ac Hepatitis-[facility] [id number]) [facility]- Est  Level  3 (11914)  Future Orders: Ac Hepatitis-[facility] [id number]) ... 01/03/2011 Hep Bs Ab-[facility] ([id number]) ... 01/03/2011 HIV-[facility] ([id number]) ... 01/03/2011  Complete Medication List: 1)  Econazole Nitrate 1 % Crea (Econazole nitrate) .... Apply to affected area two times a day. dispense 60 grams 2)  Omeprazole 20 Mg Cpdr (Omeprazole) .... One tablet by mouth one to two times  a day as needed for reflux symptoms 3)  Celexa 20 Mg Tabs (Citalopram hydrobromide) .[name].. 1 tab by mouth daily for anxiety and depression 4)  Tylenol Extra Strength 500 Mg Tabs (Acetaminophen) .... 2 tabs by mouth three times a day scheduled for chronic back pain 5)  Oxycodone Hcl 5 Mg Tabs (Oxycodone hcl) .[name].. 1 tab by mouth q4h as needed for breakthrough pain 6)  Flexeril 10 Mg Tabs (Cyclobenzaprine hcl) .[name].. 1 tab by mouth at bedtime as needed for muscle spasms

## 2010-06-29 NOTE — Assessment & Plan Note (Signed)
Summary: f/u anxiety, radicular pain, and new URI   Vital Signs:  Patient profile:   40 year old male Height:      68.25 inches Weight:      223.4 pounds BMI:     33.84 Temp:     98.4 degrees F oral Pulse rate:   108 / minute BP sitting:   116 / 76  (left arm) Cuff size:   regular  Vitals Entered By: Garen Grams LPN (June 21, 2009 3:43 PM) CC: f/u on anxiety; cold symptoms Is Patient Diabetic? No Pain Assessment Patient in pain? yes     Location: back   Primary Care Provider:  Marisue Ivan  MD  CC:  f/u on anxiety; cold symptoms.  History of Present Illness: 40yo M here for f/u after starting celexa and new cold symptoms  Anxiety: Started Celexa on 05/04/2009.  States that he was unsure if he could tell a difference but did state that he was less anxious the last time he was in a stressful situation.  He stopped taking the medication b/c he had difficulty ejaculating during sex.  No other side effects.  New cold symptoms: Cough (productive, nonbloody) and congestion/rhinorrhea.  x 2 weeks.  Currently taking Nyquil and Dayquil.  No fevers associated.    Back pain: Improving on the tylenol and oxycodone.  Less neuropathic symptoms.  Could not tolerate the gabapentin.  Caused him to feel dizzy and nauseous.    Habits & Providers  Alcohol-Tobacco-Diet     Tobacco Status: never  Current Medications (verified): 1)  Econazole Nitrate 1 %  Crea (Econazole Nitrate) .... Apply To Affected Area Two Times A Day. Dispense 60 Grams 2)  Omeprazole 20 Mg Cpdr (Omeprazole) .... One Tablet By Mouth Two Times A Day 3)  Desonide 0.05 % Crea (Desonide) .... Apply To Affected Area Two Times A Day (Avoid Use On Face For Longer Than 2 Weeks) Disp: Large 4)  Celexa 20 Mg Tabs (Citalopram Hydrobromide) .[name].. 1 Tab By Mouth Daily For Anxiety and Depression 5)  Tylenol Extra Strength 500 Mg Tabs (Acetaminophen) .... 2 Tabs By Mouth Three Times A Day Scheduled For Chronic Back Pain 6)   Oxycodone Hcl 5 Mg Tabs (Oxycodone Hcl) .[name].. 1 Tab By Mouth Q4h As Needed For Breakthrough Pain 7)  Nystatin 100000 Unit/gm Oint (Nystatin) .... Apply To Affected Area Two Times A Day  Disp: 1 Month Supply  Allergies: 1)  Clarithromycin (Clarithromycin) 2)  * Gabapentin  Review of Systems      See HPI  Physical Exam  General:  VS Reviewed. Well appearing, NAD.  Eyes:  no injected conjuntiva Lungs:  no respiratory distress Skin:  nl color and turgor Psych:  no SI/HI Anxiety improved in stressful situations does not appear anxious   Impression & Recommendations:  Problem # 1:  UPPER RESPIRATORY INFECTION, VIRAL (ICD-465.9) Assessment New  Hx and symptoms c/w URI.  Supportive care.  Nasal saline spray with Mucinex DM.  His updated medication list for this problem includes:    Tylenol Extra Strength 500 Mg Tabs (Acetaminophen) .[name]... 2 tabs by mouth three times a day scheduled for chronic back pain  Orders: [facility]- Est  Level 4 ([address]98)  Problem # 2:  ANXIETY (ICD-300.00) Assessment: Improved  Seems to be overall improved.  He did having complaints of difficulting ejaculating.  I offered to stop the medication but he wanted to try a higher dose to see if it would improve his anxiety since he can  tell a small difference on the current dose.  Will have him return in 2-4 wks to reassess.  His updated medication list for this problem includes:    Celexa 20 Mg Tabs (Citalopram hydrobromide) .[name]... 1 tab by mouth daily for anxiety and depression  Orders: [facility]- Est  Level 4 ([address])  Problem # 3:  DJD, UNSPECIFIED (ICD-715.90) Assessment: Improved  Radiating back pain improved.  Continue with the scheduled tylenol and oxycodone for breakthrough pain.  Gabapentin discontinued.  Asked him to look up Lyrica and read about it.  It could potentially help his neuropathic pain.  Will discuss this at the next visit.  His updated medication list for this problem includes:    Tylenol Extra  Strength 500 Mg Tabs (Acetaminophen) .[name]... 2 tabs by mouth three times a day scheduled for chronic back pain    Oxycodone Hcl 5 Mg Tabs (Oxycodone hcl) .[name]... 1 tab by mouth q4h as needed for breakthrough pain  Orders: [facility]- Est  Level 4 ([address])  Complete Medication List: 1)  Econazole Nitrate 1 % Crea (Econazole nitrate) .... Apply to affected area two times a day. dispense 60 grams 2)  Omeprazole 20 Mg Cpdr (Omeprazole) .... One tablet by mouth two times a day 3)  Desonide 0.05 % Crea (Desonide) .... Apply to affected area two times a day (avoid use on face for longer than 2 weeks) disp: large 4)  Celexa 20 Mg Tabs (Citalopram hydrobromide) .[name].. 1 tab by mouth daily for anxiety and depression 5)  Tylenol Extra Strength 500 Mg Tabs (Acetaminophen) .... 2 tabs by mouth three times a day scheduled for chronic back pain 6)  Oxycodone Hcl 5 Mg Tabs (Oxycodone hcl) .[name].. 1 tab by mouth q4h as needed for breakthrough pain 7)  Nystatin 100000 Unit/gm Oint (Nystatin) .... Apply to affected area two times a day  disp: 1 month supply  Patient Instructions: 1)  Follow up in 2-4 weeks to reassess the anxiety on the celexa. 2)  We inc the dose to 20mg  daily on the Celexa (finish what you have- take 2 tablets each day) 3)  Go read about Lyrica. 4)  For the cough and cold...use the nasal saline spray two times a day and pick up the Mucinex DM. Prescriptions: NYSTATIN 100000 UNIT/GM OINT (NYSTATIN) apply to affected area two times a day  disp: 1 month supply  #1 x 2   Entered and Authorized by:   Marisue Ivan  MD   Signed by:   Marisue Ivan  MD on 06/21/2009   Method used:   Electronically to        [facility] [address] [phone]* (retail)       [address] [place], [place]  [address]       Ph: [phone]       Fax: [phone]   RxID:   [id number] CELEXA 20 MG TABS (CITALOPRAM HYDROBROMIDE) 1 tab by mouth daily for anxiety and depression  #30 x 1   Entered and Authorized by:   Marisue Ivan  MD   Signed by:   Marisue Ivan  MD on 06/21/2009   Method used:   Electronically to        [facility] [address] [phone]* (retail)       [address] [place], [place]  [address]       Ph: [phone]       Fax: [phone]  RxID:   [id number]

## 2010-06-29 NOTE — Progress Notes (Signed)
Summary: Oxycodone 5mg  q4h prn #50 x0   Phone Note Refill Request Call back at Home Phone [phone] Message from:  Patient  Refills Requested: Medication #1:  OXYCODONE HCL 5 MG TABS 1 tab by mouth q4h as needed for breakthrough pain Initial call taken by: Clydell Hakim,  Oct 18, 2009 12:18 PM  Follow-up for Phone Call        Pt has not been known to abuse the medication.  #180 has lasted him over 5 months.  Will provide #50 which I expect will last him approx 2 months.  Will leave rx at front office to be picked up. Follow-up by: Marisue Ivan  MD,  Oct 18, 2009 2:23 PM    Prescriptions: OXYCODONE HCL 5 MG TABS (OXYCODONE HCL) 1 tab by mouth q4h as needed for breakthrough pain  #50 x 0   Entered and Authorized by:   Marisue Ivan  MD   Signed by:   Marisue Ivan  MD on 10/18/2009   Method used:   Print then Give to Patient   RxID:   [id number]

## 2010-06-29 NOTE — Progress Notes (Signed)
Summary: phn msg   Phone Note Call from Patient Call back at [facility] Phone [phone]   Caller: Patient Summary of Call: is on Celexa- weaned himself off and now getting dizzy spells again- started taking again and getting better, but doesn't feel he needs to be taking this.  Initial call taken by: De Nurse,  January 31, 2010 2:06 PM  Follow-up for Phone Call        Talked to pt seemeed to be doing better with the celxa, told him to continue to take the medicine if it is helping if the dizziness though becomes too bad then please come and be seen.  Told pt he should definately come in in the next 2 weeks to try to talk to dr. Lear Ng and attempt to wean medication slower this next time. Pt verbalized understanding.  Follow-up by: Antoine Primas DO,  February 01, 2010 10:58 AM

## 2010-06-29 NOTE — Miscellaneous (Signed)
Summary: wants pain med refilled   Clinical Lists Changes he is moving & wants pain meds refilled asap. uses [facility] [address] RN  February 13, 2010 2:27 PM  Medications: Rx of OXYCODONE HCL 5 MG TABS (OXYCODONE HCL) 1 tab by mouth q4h as needed for breakthrough pain;  #50 x 0;  Signed;  Entered by: Renold Don MD;  Authorized by: Renold Don MD;  Method used: Print then Give to Patient Rx of OXYCODONE HCL 5 MG TABS (OXYCODONE HCL) 1 tab by mouth q4h as needed for breakthrough pain;  #50 x 0;  Signed;  Entered by: Renold Don MD;  Authorized by: Renold Don MD;  Method used: Print then Give to Patient    Prescriptions: OXYCODONE HCL 5 MG TABS (OXYCODONE HCL) 1 tab by mouth q4h as needed for breakthrough pain  #50 x 0   Entered and Authorized by:   Renold Don MD   Signed by:   Renold Don MD on 02/14/2010   Method used:   Print then Give to Patient   RxID:   [id number] OXYCODONE HCL 5 MG TABS (OXYCODONE HCL) 1 tab by mouth q4h as needed for breakthrough pain  #50 x 0   Entered and Authorized by:   Renold Don MD   Signed by:   Renold Don MD on 02/14/2010   Method used:   Print then Give to Patient   RxID:   [id number]  Above prescriptionn did not print.  Will try again on a different printer.

## 2010-06-29 NOTE — Progress Notes (Signed)
Summary: Follow up call.   Phone Note Outgoing Call Call back at [facility] Phone [phone]   Call placed by: Rodney Langton MD,  January 04, 2010 5:24 PM Summary of Call: Notified pt that HIV, HepA, Hep B, Hep C was negative.  Reminded to come back 6 wks, 3 months, 6 months for repeat testing.  Pt agreeable and thankful. Initial call taken by: Rodney Langton MD,  January 04, 2010 5:24 PM

## 2010-06-29 NOTE — Assessment & Plan Note (Signed)
Summary: acid reflux//med refill--ch.   History of Present Illness Visit Type: Follow-up Visit Primary GI MD: Elie Goody MD [facility] Primary Provider: Renold Don MD Chief Complaint: acid reflux and med refill wants to switch to Nexium History of Present Illness:   This is a 40 year old man with GERD and IBS. He states his reflux symptoms are under good but not excellent control on omeprazole 20 mg daily. He occasionally takes a second dose. His reflux symptoms were better controlled with AcipHex, Nexium and Dexilant.  He has had ongoing problems with intermittent abdominal bloating and loose stools on a daily basis. A colonoscopy done for the same reasons was negative in 2007.  He complains of frequent pain in his right lower chest and right upper quadrant that is not related to meals or bowel movements. He describes a soreness or tenderness and he localizes the symptoms to his right lower ribs.   GI Review of Systems    Reports abdominal pain, acid reflux, and  chest pain.     Location of  Abdominal pain: RUQ.    Denies belching, bloating, dysphagia with liquids, dysphagia with solids, heartburn, loss of appetite, nausea, vomiting, vomiting blood, weight loss, and  weight gain.        Denies anal fissure, black tarry stools, change in bowel habit, constipation, diarrhea, diverticulosis, fecal incontinence, heme positive stool, hemorrhoids, irritable bowel syndrome, jaundice, light color stool, liver problems, rectal bleeding, and  rectal pain.   Current Medications (verified): 1)  Econazole Nitrate 1 %  Crea (Econazole Nitrate) .... Apply To Affected Area Two Times A Day. Dispense 60 Grams 2)  Omeprazole 20 Mg Cpdr (Omeprazole) .... One Tablet By Mouth One To Two Times A Day As Needed For Reflux Symptoms 3)  Celexa 20 Mg Tabs (Citalopram Hydrobromide) .[name].. 1 Tab By Mouth Daily For Anxiety and Depression 4)  Tylenol Extra Strength 500 Mg Tabs (Acetaminophen) .... 2 Tabs By Mouth Three  Times A Day Scheduled For Chronic Back Pain 5)  Oxycodone Hcl 5 Mg Tabs (Oxycodone Hcl) .[name].. 1 Tab By Mouth Q4h As Needed For Breakthrough Pain 6)  Flexeril 10 Mg Tabs (Cyclobenzaprine Hcl) .[name].. 1 Tab By Mouth At Bedtime As Needed For Muscle Spasms 7)  Ketoconazole 200 Mg Tabs (Ketoconazole) .... Take 1 Tab By Mouth Daily For Next 5 Days ( Hasn;t Taken Yet) 8)  Lidoderm 5 % Ptch (Lidocaine) .... Apply One Patch To Right Upper Quadrant Every 8 Hours As Needed Pain  Allergies (verified): 1)  Clarithromycin (Clarithromycin) 2)  * Gabapentin  Past History:  Past Medical History: Needle stick injury 01/01/10, currently checking HepB/HepC/HIV at 6wk, 63mo, 42mo post-exposure.  PPx not indicated. Grade A erosive esophagitis Hiatal hernia GERD IBS Diverticulosis Kidney Stones-2005 Chronic neck and back pain Hiatal Hernia  Past Surgical History: Cyst removal from Jaw- 1996 Kidney Stone Removal  Family History: Family History Diabetes 1st degree relative mother GF- IDDM No premature MIs No FH of Colon Cancer:  Social History: Reviewed history from 01/19/2009 and no changes required. Lives w/ wife Cala Bradford) in [place] disabled due to low back pain Alcohol Use - no Daily Caffeine Use -1 Illicit Drug Use - no Best Contact# [phone] Patient is a former smoker.  Smoking Status:  quit  Review of Systems       The pertinent positives and negatives are noted as above and in the HPI. All other ROS were reviewed and were negative.   Vital Signs:  Patient profile:   39  year old male Height:      68.25 inches Weight:      223 pounds BMI:     33.78 Pulse rate:   72 / minute Pulse rhythm:   regular BP sitting:   112 / 76  (left arm)  Vitals Entered By: Milford Cage NCMA (April 24, 2010 2:48 PM)  Physical Exam  General:  Well developed, well nourished, no acute distress. Head:  Normocephalic and atraumatic. Eyes:  PERRLA, no icterus. Mouth:  No deformity or lesions,  dentition normal. Chest Wall:  Moderate tenderness on R lower ribs at the costal margin Lungs:  Clear throughout to auscultation. Heart:  Regular rate and rhythm; no murmurs, rubs,  or bruits. Abdomen:  Soft, nontender and nondistended. No masses, hepatosplenomegaly or hernias noted. Normal bowel sounds. Psych:  anxious.    Impression & Recommendations:  Problem # 1:  GERD (ICD-530.81) GERD with a history of erosive esophagitis. Incomplete symptom control on the current regimen. Change to omeprazole 40 mg daily and intensify antireflux measures.  Problem # 2:  RIB PAIN, RIGHT SIDED (ICD-786.50) Musculoskeletal right rib pain. Further management per primary physician. No further gastrointestinal evaluation needed.  Problem # 3:  IRRITABLE BOWEL SYNDROME (ICD-564.1) Diarrhea predominant irritable bowel syndrome. Trial of Imodium daily as needed.  Patient Instructions: 1)  Pick up your prescription from your pharmacy. 2)  Avoid foods high in acid content ( tomatoes, citrus juices, spicy foods) . Avoid eating within 3 to 4 hours of lying down or before exercising. Do not over eat; try smaller more frequent meals. Elevate head of bed four inches when sleeping.  3)  Use Imodium once daily as needed for diarrhea.  4)  Copy sent to : Renold Don, MD 5)  The medication list was reviewed and reconciled.  All changed / newly prescribed medications were explained.  A complete medication list was provided to the patient / caregiver.  Prescriptions: OMEPRAZOLE 40 MG CPDR (OMEPRAZOLE) one capsule by mouth every morning  #34 x 11   Entered by:   Christie Nottingham CMA (AAMA)   Authorized by:   Meryl Dare MD [facility]   Signed by:   Christie Nottingham CMA (AAMA) on 04/24/2010   Method used:   Electronically to        [facility] [address] [phone]* (retail)       [address]       [place], [place]  [address]       Ph: [phone]       Fax: [phone]   RxID:   [id number]

## 2010-06-29 NOTE — Progress Notes (Signed)
Summary: triage   Phone Note Call from Patient Call back at Home Phone [phone]   Caller: Patient Summary of Call: grandfather gave himself insulin shot yesterday and didn't put cap on needle.  pt picked it up and got stuck and is now concerned. Initial call taken by: De Nurse,  January 02, 2010 11:15 AM  Follow-up for Phone Call        LM Follow-up by: Golden Circle RN,  January 02, 2010 11:17 AM  Additional Follow-up for Phone Call Additional follow up Details #1::        pt returned call Additional Follow-up by: De Nurse,  January 02, 2010 11:37 AM    Additional Follow-up for Phone Call Additional follow up Details #2::    he is worried about what he may have gotten from the needlestick. appt made for today. told him he will get labs for HIV, Hep etc. and then can get after a period of time to make sure they are still negative says his grandfather "ran around" on his grandmom, had 2 recent surgeries and he is afraid to ask because he "will get angry" Follow-up by: Golden Circle RN,  January 02, 2010 11:38 AM

## 2010-06-29 NOTE — Progress Notes (Signed)
Summary: Rx Req   Phone Note Refill Request Call back at Home Phone [phone] Message from:  Patient  Refills Requested: Medication #1:  OXYCODONE HCL 5 MG TABS 1 tab by mouth q4h as needed for breakthrough pain Initial call taken by: Clydell Hakim,  December 14, 2009 10:14 AM    I am at [facility] today and tomorrow.  Will be unable to come by office during normal business hours to fulfill his prescription.  However I will be in clinic on Wed and will refill 1 month's worth at that time.  Patient needs follow-up with me before any further refills after this month.  Renold Don MD  December 19, 2009 9:50 AM

## 2010-06-29 NOTE — Assessment & Plan Note (Signed)
Summary: f/u chronic issues   Vital Signs:  Patient profile:   40 year old male Weight:      224.5 pounds BMI:     34.01 Temp:     98.5 degrees F oral Pulse rate:   75 / minute Pulse rhythm:   regular BP sitting:   107 / 75  (left arm) Cuff size:   large  Vitals Entered By: Loralee Pacas CMA (September 15, 2009 2:00 PM)  Primary Care Provider:  Marisue Ivan  MD  CC:  dizziness.  History of Present Illness: 40yo M here to f/u on chronic issues and new dizziness  Anxiety: States that symptoms better controlled on current dose of Celexa.  No further adverse effects.  Chronic back/neck pain: Controlled on current regimen.  Not having to take much pain medication.  No new symptoms or worsening of symptoms.  Dizziness: New symptom over the past 3-4 days.  Started since being out of the Celexa and wearing contact lenses.  Denies vertigo or syncopal episodes.  Occurs with different head positions lasting for a few seconds.  No head trauma or event to explain cause.  Very brief vision changes associated.  NO N/V.  GERD: Well controlled on current regimen.  Taking the omeprazole on as needed basis.  Current Medications (verified): 1)  Econazole Nitrate 1 %  Crea (Econazole Nitrate) .... Apply To Affected Area Two Times A Day. Dispense 60 Grams 2)  Omeprazole 20 Mg Cpdr (Omeprazole) .... One Tablet By Mouth One To Two Times A Day As Needed For Reflux Symptoms 3)  Celexa 20 Mg Tabs (Citalopram Hydrobromide) .[name].. 1 Tab By Mouth Daily For Anxiety and Depression 4)  Tylenol Extra Strength 500 Mg Tabs (Acetaminophen) .... 2 Tabs By Mouth Three Times A Day Scheduled For Chronic Back Pain 5)  Oxycodone Hcl 5 Mg Tabs (Oxycodone Hcl) .[name].. 1 Tab By Mouth Q4h As Needed For Breakthrough Pain 6)  Flexeril 10 Mg Tabs (Cyclobenzaprine Hcl) .[name].. 1 Tab By Mouth At Bedtime As Needed For Muscle Spasms  Allergies (verified): 1)  Clarithromycin (Clarithromycin) 2)  * Gabapentin  Past History:  Past  Medical History: Grade A erosive esophagitis Hiatal hernia GERD IBS Diverticulosis Kidney Stones-2005 Chronic neck and back pain  Past Surgical History: Reviewed history from 01/19/2009 and no changes required. Cyst removal from Jaw- 1996  Review of Systems      See HPI  Physical Exam  General:  VS Reviewed. Well appearing, NAD. Pt getting around without any difficulty. Head:  atraumatic.   Eyes:  EOMI PERRLA vision grossly intact Neck:  supple, full ROM, no goiter or mass  Lungs:  no respiratory distress clear to auscultation Heart:  Normal rate and regular rhythm. S1 and S2 normal without gallop, murmur, click, rub or other extra sounds. Extremities:  no swelling Neurologic:  no focal deficits CN 2-12 grossly intact Psych:  good eye contact polite and cooperative not anxious appearing   Impression & Recommendations:  Problem # 1:  ANXIETY (ICD-300.00) Assessment Improved  Symptoms under better control on current regimen. 6 month supply provided. no changes to regimen. goal is to titrate back in August.  His updated medication list for this problem includes:    Celexa 20 Mg Tabs (Citalopram hydrobromide) .[name]... 1 tab by mouth daily for anxiety and depression  Orders: [facility]- Est  Level 4 (48546)  Problem # 2:  CERVICAL RADICULOPATHY, LEFT (ICD-723.4) Assessment: Improved  Pain well controlled. Not having to use much of his pain  meds. No further intervention at this time.  Orders: [facility]- Est  Level 4 ([address]109)  Problem # 3:  DIZZINESS (ICD-780.4) Assessment: New  Not actively symptomatic. No indication of vertigo.  No neurological deficits. Advised patient to get back on original regimen of Celexa and see if it improves. If symptoms don't improve, plan to return for further evaluation and treatment.  Orders: [facility]- Est  Level 4 (99214)  Problem # 4:  IRRITABLE BOWEL SYNDROME (ICD-564.1) Assessment: Unchanged Well controlled on current regimen. No  changes. Advised to avoid NSAIDs.  Complete Medication List: 1)  Econazole Nitrate 1 % Crea (Econazole nitrate) .... Apply to affected area two times a day. dispense 60 grams 2)  Omeprazole 20 Mg Cpdr (Omeprazole) .... One tablet by mouth one to two times a day as needed for reflux symptoms 3)  Celexa 20 Mg Tabs (Citalopram hydrobromide) .[name].. 1 tab by mouth daily for anxiety and depression 4)  Tylenol Extra Strength 500 Mg Tabs (Acetaminophen) .... 2 tabs by mouth three times a day scheduled for chronic back pain 5)  Oxycodone Hcl 5 Mg Tabs (Oxycodone hcl) .[name].. 1 tab by mouth q4h as needed for breakthrough pain 6)  Flexeril 10 Mg Tabs (Cyclobenzaprine hcl) .[name].. 1 tab by mouth at bedtime as needed for muscle spasms  Patient Instructions: 1)  Please schedule a follow-up appointment in 6 months unless something comes up and you need to be seen sooner. 2)  Call me if your dizziness does not improve. Prescriptions: CELEXA 20 MG TABS (CITALOPRAM HYDROBROMIDE) 1 tab by mouth daily for anxiety and depression  #90 x 1   Entered and Authorized by:   Marisue Ivan  MD   Signed by:   Marisue Ivan  MD on 09/15/2009   Method used:   Electronically to        [facility] [address] [phone]* (retail)       [address] [place], [place]  [address]       Ph: [phone]       Fax: [phone]   RxID:   [id number] OMEPRAZOLE 20 MG CPDR (OMEPRAZOLE) one tablet by mouth one to two times a day as needed for reflux symptoms  #90 x 1   Entered and Authorized by:   Marisue Ivan  MD   Signed by:   Marisue Ivan  MD on 09/15/2009   Method used:   Electronically to        [facility] [address] [phone]* (retail)       [address] [place], [place]  [address]       Ph: [phone]       Fax: [phone]   RxID:   [id number]

## 2010-06-29 NOTE — Progress Notes (Signed)
Summary: Amoxicillin 500mg  bid x 10 days  Medications Added AMOXICILLIN 500 MG CAPS (AMOXICILLIN) 1 tab by mouth two times a day x 10 days       Phone Note Call from Patient Call back at [facility] Phone [phone]   Caller: Patient Summary of Call: doesn NOT have a cough, but thinks it is sinus inf - would like something called for this. Initial call taken by: De Nurse,  June 24, 2009 11:33 AM  Follow-up for Phone Call        Only coughing in Am, not chest related, just coughing to clear throat, coughing up green mucus.  Wants anitbiotic, all in head not in chest.  Ears popping.  lightheaded.  explained why you called in tessalon perles and not antibiotic and that he may need to be seen again for antibiotic Tx if needed.  Wanted me to send message to PCP.  To PCP Follow-up by: Gladstone Pih,  June 24, 2009 11:51 AM  Additional Follow-up for Phone Call Additional follow up Details #1::        Amox sent in. Additional Follow-up by: Marisue Ivan  MD,  June 24, 2009 12:08 PM    New/Updated Medications: AMOXICILLIN 500 MG CAPS (AMOXICILLIN) 1 tab by mouth two times a day x 10 days Prescriptions: AMOXICILLIN 500 MG CAPS (AMOXICILLIN) 1 tab by mouth two times a day x 10 days  #20 x 0   Entered and Authorized by:   Marisue Ivan  MD   Signed by:   Marisue Ivan  MD on 06/24/2009   Method used:   Electronically to        [facility] [address] [phone]* (retail)       [address] [place], [place]  [address]       Ph: [phone]       Fax: [phone]   RxID:   [id number]   Appended Document: Amoxicillin 500mg  bid x 10 days Notified Pt that Rx was at phar for antibiotic.

## 2010-06-29 NOTE — Assessment & Plan Note (Signed)
Summary: f/up,tcb   Vital Signs:  Patient profile:   40 year old male Weight:      220.8 pounds Temp:     97.9 degrees F oral Pulse rate:   52 / minute Pulse rhythm:   regular BP sitting:   116 / 77  (left arm) Cuff size:   large  Vitals Entered By: Loralee Pacas CMA (January 06, 2010 8:44 AM) CC: follow-up visit   Primary Care Provider:  Renold Don MD  CC:  follow-up visit.  History of Present Illness: 1.  Side pain - Patient complaining of chronic rib pain.  Has had this for several months.  Describes as sharp, stabbing pain in side, worsened with certain position.  Tender to palpation.  Has had workup with abdominal US which was WNL previously.  Some relief with OTC Tylenol and when he takes a Percocet which he is prescribed for back pain.    2.  Stop Celexa - Patient states he would like to stop Celexa.  Has been on this for almost a year.  Was put on the medication when his son was sent to prison.  Feels like it's helped, but also feels like he no longer needs it.  Doesn't like to take many meds.    3.  Refill on cream: Patient has rash on his trunk which has been there for several months.  Was prescribed -azole cream with some relief.  Describes as large red rash on his waist and back.  Sometimes itches.  Skin in area is lighter in summertime.    ROS:  no headaches, pre-syncopal or syncopal episodes, chest pain, palpitations, shortness of breath or dyspnea, abdominal pain, diarrhea or constipation, melena, hematochezia, lower extremity swelling.  No recent illnesses    Current Problems (verified): 1)  Rib Pain, Right Sided  (ICD-786.50) 2)  Tinea Versicolor  (ICD-111.0) 3)  Contact or Exposure To Other Viral Diseases  (ICD-V01.79) 4)  Dizziness  (ICD-780.4) 5)  Cervical Radiculopathy, Left  (ICD-723.4) 6)  Health Maintenance Exam  (ICD-V70.0) 7)  Irritable Bowel Syndrome  (ICD-564.1) 8)  Gerd  (ICD-530.81) 9)  Esophagitis, Hx of  (ICD-V12.79) 10)  Colonic Polyps,  Hyperplastic  (ICD-211.3) 11)  Hiatal Hernia  (ICD-553.3) 12)  Diverticular Disease  (ICD-562.10) 13)  Family History Diabetes 1st Degree Relative  (ICD-V18.0) 14)  Djd, Unspecified  (ICD-715.90)  Current Medications (verified): 1)  Econazole Nitrate 1 %  Crea (Econazole Nitrate) .... Apply To Affected Area Two Times A Day. Dispense 60 Grams 2)  Omeprazole 20 Mg Cpdr (Omeprazole) .... One Tablet By Mouth One To Two Times A Day As Needed For Reflux Symptoms 3)  Celexa 20 Mg Tabs (Citalopram Hydrobromide) .[name].. 1 Tab By Mouth Daily For Anxiety and Depression 4)  Tylenol Extra Strength 500 Mg Tabs (Acetaminophen) .... 2 Tabs By Mouth Three Times A Day Scheduled For Chronic Back Pain 5)  Oxycodone Hcl 5 Mg Tabs (Oxycodone Hcl) .[name].. 1 Tab By Mouth Q4h As Needed For Breakthrough Pain 6)  Flexeril 10 Mg Tabs (Cyclobenzaprine Hcl) .[name].. 1 Tab By Mouth At Bedtime As Needed For Muscle Spasms 7)  Ketoconazole 200 Mg Tabs (Ketoconazole) .... Take 1 Tab By Mouth Daily For Next 5 Days  Allergies (verified): 1)  Clarithromycin (Clarithromycin) 2)  * Gabapentin  Past History:  Past medical, surgical, family and social histories (including risk factors) reviewed, and no changes noted (except as noted below).  Past Medical History: Reviewed history from 01/02/2010 and no changes required. Needle  stick injury 01/01/10, currently checking HepB/HepC/HIV at 6wk, 29mo, 35mo post-exposure.  PPx not indicated.  Grade A erosive esophagitis Hiatal hernia GERD IBS Diverticulosis Kidney Stones-2005 Chronic neck and back pain  Past Surgical History: Reviewed history from 01/19/2009 and no changes required. Cyst removal from Jaw- 1996  Family History: Reviewed history from 01/19/2009 and no changes required. Family History Diabetes 1st degree relative mother GF- IDDM No premature MIs  Social History: Reviewed history from 01/19/2009 and no changes required. Lives w/ wife Cala Bradford) in [place] disabled  due to low back pain Alcohol Use - no Daily Caffeine Use -1 Illicit Drug Use - no Best Contact# [phone]  Physical Exam  General:  Vital signs reviewed. Well-developed, well-nourished patient in NAD.  Awake and cooperative  Lungs:  clear to auscultation bilaterally without wheezing, rales, or rhonchi.  Normal work of breathing  Heart:  Regular rate and rhythm without murmur, rub, or gallop.  Normal S1/S2  Abdomen:  soft/nondistended/nontender.  No organomegaly, bowel sounds present.  Murphy's sign absent  Msk:  TTP along 11 and 12th rib of Right thorax.   Skin:  10 x 12 cm erythematous plaque on Left flank.  No other lesions or rashes noted.   Psych:  Oriented X3, memory intact for recent and remote, normally interactive, good eye contact, not anxious appearing, and not depressed appearing.  No SI/HI present   Impression & Recommendations:  Problem # 1:  RIB PAIN, RIGHT SIDED (ICD-786.50) Assessment Unchanged Perhaps broken rib due to tenderness, could also be bruised.  Treatment for either would be the same.  Tenderness along bony prominence of rib.  No real LUQ tenderness.  Previous US of abd negative, less likely any hepatic or gallbladder disease.  Recent hepatic function tests are within normal limits.  Treat with NSAIDs for pain relief.  Less likely to be PE or cardiac chest pain due to location, duration of pain, no shortness of breath.  Will follow Orders: [facility]- Est  Level 4 (99214)  Problem # 2:  TINEA VERSICOLOR (ICD-111.0) Assessment: Deteriorated Most likely diagnosis due to size, location, quality of rash.  Plan to prescribe oral ketoconazole to help clear rash, also may use cream.  Warned patient that rash may recur.  He is to call if it does not improve or worsens with medication.   His updated medication list for this problem includes:    Econazole Nitrate 1 % Crea (Econazole nitrate) .[name]... Apply to affected area two times a day. dispense 60 grams    Ketoconazole  200 Mg Tabs (Ketoconazole) .[name]... Take 1 tab by mouth daily for next 5 days  Orders: [facility]- Est  Level 4 (56213)  Problem # 3:  ANXIETY (ICD-300.00) Assessment: Improved Patient would like to be taken off Celexa.  Feels he has improved, does not want to be on any chronic meds that he does not have to be on.  Will start taper today, explicit instructions not to stop taking suddenly.  Plan to decrease by half over next 2 weeks.  Will follow-up with patient to make sure he is still doing well in 2-3 weeks.   His updated medication list for this problem includes:    Celexa 20 Mg Tabs (Citalopram hydrobromide) .[name]... 1 tab by mouth daily for anxiety and depression  Complete Medication List: 1)  Econazole Nitrate 1 % Crea (Econazole nitrate) .... Apply to affected area two times a day. dispense 60 grams 2)  Omeprazole 20 Mg Cpdr (Omeprazole) .... One tablet by mouth  one to two times a day as needed for reflux symptoms 3)  Celexa 20 Mg Tabs (Citalopram hydrobromide) .[name].. 1 tab by mouth daily for anxiety and depression 4)  Tylenol Extra Strength 500 Mg Tabs (Acetaminophen) .... 2 tabs by mouth three times a day scheduled for chronic back pain 5)  Oxycodone Hcl 5 Mg Tabs (Oxycodone hcl) .[name].. 1 tab by mouth q4h as needed for breakthrough pain 6)  Flexeril 10 Mg Tabs (Cyclobenzaprine hcl) .[name].. 1 tab by mouth at bedtime as needed for muscle spasms 7)  Ketoconazole 200 Mg Tabs (Ketoconazole) .... Take 1 tab by mouth daily for next 5 days  Patient Instructions: 1)  For the Celexa take 1/2 pill (10 mg) for 2 weeks. 2)  Then take 10 mg M,W,F of last week.  Then stop. 3)  You might have a few symptoms stopping the medicine. 4)  For the fungus called Tinea versicolor, take the Ketoconazole pill for 8 days then stop.  You can keep taking the cream.   5)  Otherwise make an appointment to follow-up in 6 months.   6)  It was good to meet you.  Prescriptions: ECONAZOLE NITRATE 1 %  CREA (ECONAZOLE NITRATE) apply to  affected area two times a day. dispense 60 grams  #60.0 Gram x 0   Entered and Authorized by:   Renold Don MD   Signed by:   Renold Don MD on 01/06/2010   Method used:   Electronically to        [facility] [address] [phone]* (retail)       [address] [place], [place]  [address]       Ph: [phone]       Fax: [phone]   RxID:   [id number] KETOCONAZOLE 200 MG TABS (KETOCONAZOLE) Take 1 tab by mouth daily for next 5 days  #5 x 1   Entered and Authorized by:   Renold Don MD   Signed by:   Renold Don MD on 01/06/2010   Method used:   Electronically to        [facility] [address] [phone]* (retail)       [address] [place], [place]  [address]       Ph: [phone]       Fax: [phone]   RxID:   [id number]    Prevention & Chronic Care Immunizations   Influenza vaccine: Not documented    Tetanus booster: 07/26/2004: Done.   Tetanus booster due: 07/27/2014    Pneumococcal vaccine: Not documented  Other Screening   Smoking status: never  (06/21/2009)  Lipids   Total Cholesterol: 172  (03/11/2009)   LDL: 112  (03/11/2009)   LDL Direct: Not documented   HDL: 35  (03/11/2009)   Triglycerides: 126  (03/11/2009)

## 2010-06-29 NOTE — Progress Notes (Signed)
Summary: Rx Req   Phone Note Refill Request Call back at Home Phone [phone] Message from:  Patient  Refills Requested: Medication #1:  CELEXA 20 MG TABS 1 tab by mouth daily for anxiety and depression [facility]  Initial call taken by: Clydell Hakim,  September 13, 2009 11:20 AM  Follow-up for Phone Call        Pt has failed to f/u to reassess the change in celexa dose.  Will need f/u prior to next refill. Follow-up by: Marisue Ivan  MD,  September 13, 2009 11:55 AM  Additional Follow-up for Phone Call Additional follow up Details #1::        To Tammy, can you please sched apt per Dr Elbert Ewings note Additional Follow-up by: Gladstone Pih,  September 13, 2009 12:01 PM    Additional Follow-up for Phone Call Additional follow up Details #2::    LVM to schedule f/up appt with Dr. Burnadette Pop. Follow-up by: Clydell Hakim,  September 14, 2009 9:19 AM

## 2010-06-29 NOTE — Progress Notes (Signed)
Summary: phn msg   Phone Note Call from Patient Call back at Home Phone [phone]   Caller: Patient Summary of Call: is having dizzy spells and wants to know what he can do to for it. Initial call taken by: De Nurse,  June 23, 2010 3:03 PM  Follow-up for Phone Call        Pt states that he was taken off Celexa and has been having these symptoms since.  Mostly happens when he moves his head side to side or moves eyes side to side.  Pt denies recent ear or sinus infections.  Said the same thing happened that last time he was taken off the Celexa.  Spoke with Dr. Gwendolyn Grant who is concerned that it is not related to the medicaition.  Appt made with Dr. Gwendolyn Grant for Monday. Follow-up by: Dennison Nancy RN,  June 23, 2010 4:52 PM

## 2010-06-29 NOTE — Letter (Signed)
Summary: Appointment - Reschedule  [facility]  [address] [place], [place] [address]   Phone: [phone]  Fax: [phone]     August 11, 2009 MRN: [id number]   ATA [facility] [address] [place], [place]  [address]   Dear Mr. Clodfelter,   Due to a change in our office schedule, your appointment on  08-16-09 at 2:15pm                must be changed.  It is very important that we reach you to reschedule this appointment. We look forward to participating in your health care needs. Please contact us at the number listed above at your earliest convenience to reschedule this appointment.     Sincerely,  Social research officer, government

## 2010-06-29 NOTE — Assessment & Plan Note (Signed)
Summary: check rx and ?sinus infection/bmc   Vital Signs:  Patient profile:   40 year old male Weight:      226 pounds Temp:     98.4 degrees F oral Pulse rate:   82 / minute Pulse rhythm:   regular BP sitting:   125 / 81  (left arm) Cuff size:   regular  Vitals Entered By: Loralee Pacas CMA (May 26, 2010 10:23 AM) Comments pt stated that he had some itching and stinging at the tip of his penis more on the inside he has pt stated that he went to [facility] for sinus inf and was given abx, did not work    Primary Care Provider:  Renold Don MD   History of Present Illness: 1.  Sinus infection:  Previously diagnosed and treated for sinus infection about 2 weeks ago.  Given Z-pac with relief for several days but then began to worsen again.  Daily headaches, sinus drainage, sinus congestion. Headaches mostly around eyes.  Pain is daily.  Minimal cough, feels it is not in his chest.    2.  Rash:  Intermittent.  Has returned.  Had not taken Ketoconoazole previously described.  Took creams with relief, but has now returned.  Just started taking Ketoconazole.    3.  Celexa taper:  Would like taper from Celexa today.   4.  Back pain:  Started seeing Spine and Scoliosis Orthopedist physician again, Dr. Leanne Chang.  Needs 1 more refill on Oxycodone, then will start having then filled only by back physician.  Scheduled for injections in next month or so to see if this helps.  Also having conversations about vertebral fusion.    5.  Penile irritation:  In [place] about 1 week ago, had incident where penis "bumped" into toilet bowl.  Has had Increased soda intake for past week as well.  Describes irritation and mild burning at end of urethra at head of glans.  Has found kidney stones by milking urethra in past without any pain.  Has history of kidney stones with perc drainage in past, too many kidney stones to remember passed.  No irritation for past 2 days.  Wonders if he passed a kidney stone   Current  Problems (verified): 1)  Sinusitis  (ICD-473.9) 2)  Dysuria  (ICD-788.1) 3)  Abdominal Pain, Chronic  (ICD-789.00) 4)  Rib Pain, Right Sided  (ICD-786.50) 5)  Tinea Versicolor  (ICD-111.0) 6)  Contact or Exposure To Other Viral Diseases  (ICD-V01.79) 7)  Dizziness  (ICD-780.4) 8)  Anxiety  (ICD-300.00) 9)  Cervical Radiculopathy, Left  (ICD-723.4) 10)  Health Maintenance Exam  (ICD-V70.0) 11)  Irritable Bowel Syndrome  (ICD-564.1) 12)  Gerd  (ICD-530.81) 13)  Esophagitis, Hx of  (ICD-V12.79) 14)  Colonic Polyps, Hyperplastic  (ICD-211.3) 15)  Hiatal Hernia  (ICD-553.3) 16)  Diverticular Disease  (ICD-562.10) 17)  Family History Diabetes 1st Degree Relative  (ICD-V18.0) 18)  Djd, Unspecified  (ICD-715.90)  Current Medications (verified): 1)  Econazole Nitrate 1 %  Crea (Econazole Nitrate) .... Apply To Affected Area Two Times A Day. Dispense 60 Grams 2)  Omeprazole 40 Mg Cpdr (Omeprazole) .... One Capsule By Mouth Every Morning 3)  Celexa 20 Mg Tabs (Citalopram Hydrobromide) .... Take 1/2 Tab By Mouth Daily For Anxiety and Depression 4)  Tylenol Extra Strength 500 Mg Tabs (Acetaminophen) .... 2 Tabs By Mouth Three Times A Day Scheduled For Chronic Back Pain 5)  Oxycodone Hcl 5 Mg Tabs (Oxycodone Hcl) .[name].. 1 Tab By  Mouth Q4h As Needed For Breakthrough Pain 6)  Flexeril 10 Mg Tabs (Cyclobenzaprine Hcl) .[name].. 1 Tab By Mouth At Bedtime As Needed For Muscle Spasms 7)  Ketoconazole 200 Mg Tabs (Ketoconazole) .... Take 1 Tab By Mouth Daily For Next 5 Days ( Hasn;t Taken Yet) 8)  Lidoderm 5 % Ptch (Lidocaine) .... Apply One Patch To Right Upper Quadrant Every 8 Hours As Needed Pain 9)  Doxycycline Hyclate 100 Mg Caps (Doxycycline Hyclate) .... Take 1 Tab By Mouth Two Times A Day X 7 Days  Allergies (verified): 1)  Clarithromycin (Clarithromycin) 2)  * Gabapentin  Past History:  Past medical, surgical, family and social histories (including risk factors) reviewed, and no changes noted  (except as noted below).  Past Medical History: Reviewed history from 04/24/2010 and no changes required. Needle stick injury 01/01/10, currently checking HepB/HepC/HIV at 6wk, 43mo, 42mo post-exposure.  PPx not indicated. Grade A erosive esophagitis Hiatal hernia GERD IBS Diverticulosis Kidney Stones-2005 Chronic neck and back pain Hiatal Hernia  Past Surgical History: Reviewed history from 04/24/2010 and no changes required. Cyst removal from Jaw- 1996 Kidney Stone Removal  Family History: Reviewed history from 04/24/2010 and no changes required. Family History Diabetes 1st degree relative mother GF- IDDM No premature MIs No FH of Colon Cancer:  Social History: Reviewed history from 04/24/2010 and no changes required. Lives w/ wife Cala Bradford) in [place] disabled due to low back pain Alcohol Use - no Daily Caffeine Use -1 Illicit Drug Use - no Best Contact# [phone] Patient is a former smoker.    Impression & Recommendations:  Problem # 1:  SINUSITIS (ICD-473.9) Assessment Deteriorated Treat with 7 day course His updated medication list for this problem includes:    Doxycycline Hyclate 100 Mg Caps (Doxycycline hyclate) .[name]... Take 1 tab by mouth two times a day x 7 days  Orders: [facility]- Est  Level 4 ([address])  Problem # 2:  TINEA VERSICOLOR (ICD-111.0) Assessment: Deteriorated Returned.  Recommended patient wait until finishing antibiotics until taking Ketoconazole.   His updated medication list for this problem includes:    Econazole Nitrate 1 % Crea (Econazole nitrate) .[name]... Apply to affected area two times a day. dispense 60 grams    Ketoconazole 200 Mg Tabs (Ketoconazole) .[name]... Take 1 tab by mouth daily for next 5 days ( hasn;t taken yet)  Problem # 3:  ANXIETY (ICD-300.00) Assessment: Improved Resolved.  Occurred when son was put into jail.  Has tried tapering Celexa before but had SSRI withdrawal.  See instruction. His updated medication list for this  problem includes:    Celexa 20 Mg Tabs (Citalopram hydrobromide) .[name]... Take 1/2 tab by mouth daily for anxiety and depression  Problem # 4:  DJD, UNSPECIFIED (ICD-715.90) Assessment: Deteriorated Last refill for Oxycodone.  Will be provided by orthopedist in future.  History of low back pain, to be treated with surgery at near future date.   His updated medication list for this problem includes:    Tylenol Extra Strength 500 Mg Tabs (Acetaminophen) .[name]... 2 tabs by mouth three times a day scheduled for chronic back pain    Oxycodone Hcl 5 Mg Tabs (Oxycodone hcl) .[name]... 1 tab by mouth q4h as needed for breakthrough pain  Orders: [facility]- Est  Level 4 ([address]78)  Problem # 5:  DYSURIA (ICD-788.1) Unclear etiology.  UA trace blood.  Possibility he has passed kidney stone.  Patient not concerned for STD, denies new sexual partners, defers testing.  Will continue to follow.   His  updated medication list for this problem includes:    Doxycycline Hyclate 100 Mg Caps (Doxycycline hyclate) .[name]... Take 1 tab by mouth two times a day x 7 days  Orders: [facility] (00000) [facility]- Est  Level 4 (40102)  Complete Medication List: 1)  Econazole Nitrate 1 % Crea (Econazole nitrate) .... Apply to affected area two times a day. dispense 60 grams 2)  Omeprazole 40 Mg Cpdr (Omeprazole) .... One capsule by mouth every morning 3)  Celexa 20 Mg Tabs (Citalopram hydrobromide) .... Take 1/2 tab by mouth daily for anxiety and depression 4)  Tylenol Extra Strength 500 Mg Tabs (Acetaminophen) .... 2 tabs by mouth three times a day scheduled for chronic back pain 5)  Oxycodone Hcl 5 Mg Tabs (Oxycodone hcl) .[name].. 1 tab by mouth q4h as needed for breakthrough pain 6)  Flexeril 10 Mg Tabs (Cyclobenzaprine hcl) .[name].. 1 tab by mouth at bedtime as needed for muscle spasms 7)  Ketoconazole 200 Mg Tabs (Ketoconazole) .... Take 1 tab by mouth daily for next 5 days ( hasn;t taken yet) 8)  Lidoderm 5 % Ptch (Lidocaine) .... Apply one  patch to right upper quadrant every 8 hours as needed pain 9)  Doxycycline Hyclate 100 Mg Caps (Doxycycline hyclate) .... Take 1 tab by mouth two times a day x 7 days  Patient Instructions: 1)  Take 10 mg of Celexa for next 2 weeks.  After that, take M-W-F for 1 week, then M-Thurs for 1 week, then stop.   2)  We'll send in new antibiotic.  Stop the Ketoconazole while taking the antibiotic.   3)  We'll refill this last Oxycodone for you.   4)  It was good to see you again have a good New Year.  Prescriptions: CELEXA 20 MG TABS (CITALOPRAM HYDROBROMIDE) Take 1/2 tab by mouth daily for anxiety and depression  #30 x 0   Entered and Authorized by:   Renold Don MD   Signed by:   Renold Don MD on 05/26/2010   Method used:   Electronically to        [facility] [address] [phone]* (retail)       [address] [place], [place]  [address]       Ph: [phone]       Fax: [phone]   RxID:   [id number] CELEXA 20 MG TABS (CITALOPRAM HYDROBROMIDE) Take 1/2 tab by mouth daily for anxiety and depression  #30 x 0   Entered and Authorized by:   Renold Don MD   Signed by:   Renold Don MD on 05/26/2010   Method used:   Print then Give to Patient   RxID:   [id number] OXYCODONE HCL 5 MG TABS (OXYCODONE HCL) 1 tab by mouth q4h as needed for breakthrough pain  #50 x 0   Entered and Authorized by:   Renold Don MD   Signed by:   Renold Don MD on 05/26/2010   Method used:   Print then Give to Patient   RxID:   [id number] DOXYCYCLINE HYCLATE 100 MG CAPS (DOXYCYCLINE HYCLATE) Take 1 tab by mouth two times a day x 7 days  #14 x 0   Entered and Authorized by:   Renold Don MD   Signed by:   Renold Don MD on 05/26/2010   Method used:   Electronically to        [facility] [address] [phone]* (retail)       [phone] [address] [place], [place]  [address]       Ph: [phone]       Fax: [phone]   RxID:   [id number]    Orders Added: 1)  [facility]ysis-[facility] [00000] 2)  [facility]- Est  Level 4  [[address]]    Laboratory Results   Urine Tests  Date/Time Received: May 26, 2010 10:35 AM  Date/Time Reported: May 26, 2010 11:23 AM   Routine Urinalysis   Color: yellow Appearance: Clear Glucose: negative   (Normal Range: Negative) Bilirubin: negative   (Normal Range: Negative) Ketone: negative   (Normal Range: Negative) Spec. Gravity: 1.025   (Normal Range: 1.003-1.035) Blood: trace-intact   (Normal Range: Negative) pH: 5.5   (Normal Range: 5.0-8.0) Protein: negative   (Normal Range: Negative) Urobilinogen: 0.2   (Normal Range: 0-1) Nitrite: negative   (Normal Range: Negative) Leukocyte Esterace: negative   (Normal Range: Negative)  Urine Microscopic WBC/HPF: 0-5 RBC/HPF: 0-4 Bacteria/HPF: rare Epithelial/HPF: rare    Comments: ...............test performed by......Marland KitchenBonnie A. [place], MLS (ASCP)cm     Appended Document: Note prematurely signed - Physical Exam     Allergies: 1)  Clarithromycin (Clarithromycin) 2)  * Gabapentin  Physical Exam  General:  Vital signs reviewed. Well-developed, well-nourished patient in NAD.  Awake and cooperative  Eyes:  PERRL, no conjunctival injection.   Ears:  TMs clear BL Nose:  erythematous and edematous nasal turbinates BL Mouth:  MMM No tonsillar exudate or erythema Lungs:  clear to auscultation bilaterally without wheezing, rales, or rhonchi.  Normal work of breathing  Heart:  Regular rate and rhythm without murmur, rub, or gallop.  Normal S1/S2  Abdomen:  Bowel sounds positive,abdomen soft and non-tender without masses, organomegaly or hernias noted.soft, no masses, no guarding, no rigidity, no rebound tenderness, abdominal scars.  Obese   Complete Medication List: 1)  Econazole Nitrate 1 % Crea (Econazole nitrate) .... Apply to affected area two times a day. dispense 60 grams 2)  Omeprazole 40 Mg Cpdr (Omeprazole) .... One capsule by mouth every morning 3)  Celexa 20 Mg Tabs (Citalopram hydrobromide)  .... Take 1/2 tab by mouth daily for anxiety and depression 4)  Tylenol Extra Strength 500 Mg Tabs (Acetaminophen) .... 2 tabs by mouth three times a day scheduled for chronic back pain 5)  Oxycodone Hcl 5 Mg Tabs (Oxycodone hcl) .[name].. 1 tab by mouth q4h as needed for breakthrough pain 6)  Flexeril 10 Mg Tabs (Cyclobenzaprine hcl) .[name].. 1 tab by mouth at bedtime as needed for muscle spasms 7)  Ketoconazole 200 Mg Tabs (Ketoconazole) .... Take 1 tab by mouth daily for next 5 days ( hasn;t taken yet) 8)  Lidoderm 5 % Ptch (Lidocaine) .... Apply one patch to right upper quadrant every 8 hours as needed pain 9)  Doxycycline Hyclate 100 Mg Caps (Doxycycline hyclate) .... Take 1 tab by mouth two times a day x 7 days

## 2010-06-29 NOTE — Assessment & Plan Note (Signed)
Summary: side pain/bmc   Vital Signs:  Patient profile:   40 year old male Height:      68.25 inches Weight:      217 pounds BMI:     32.87 BSA:     2.12 Temp:     98.5 degrees F Pulse rate:   58 / minute BP sitting:   121 / 81  Vitals Entered By: Jone Baseman CMA (January 26, 2010 2:43 PM) CC: side pain x 12 monthes, Abdominal Pain Is Patient Diabetic? No Pain Assessment Patient in pain? yes     Location: side Intensity: 4   Primary Provider:  Renold Don MD  CC:  side pain x 12 monthes and Abdominal Pain.  History of Present Illness: Pt. is a 40 y/o male with chronic RUQ pain. The patient says it is worse with food. The pain is achy and is extremely tender on palpation of the RUQ. The lower cartilaginous ribs are also tender on deep palpation making the symptoms confusing. the patient has had normal laboratory profiles for gallbladder disease. he has two CT scans and two U/S in the last two years showing a normal appearing gallbladder.   Dyspepsia History:      There is a prior history of GERD.  A prior EGD has been done.    Habits & Providers  Alcohol-Tobacco-Diet     Alcohol drinks/day: 0     Tobacco Status: never     Tobacco Counseling: not indicated; no tobacco use  Problems Prior to Update: 1)  Rib Pain, Right Sided  (ICD-786.50) 2)  Tinea Versicolor  (ICD-111.0) 3)  Contact or Exposure To Other Viral Diseases  (ICD-V01.79) 4)  Dizziness  (ICD-780.4) 5)  Anxiety  (ICD-300.00) 6)  Cervical Radiculopathy, Left  (ICD-723.4) 7)  Health Maintenance Exam  (ICD-V70.0) 8)  Irritable Bowel Syndrome  (ICD-564.1) 9)  Gerd  (ICD-530.81) 10)  Esophagitis, Hx of  (ICD-V12.79) 11)  Colonic Polyps, Hyperplastic  (ICD-211.3) 12)  Hiatal Hernia  (ICD-553.3) 13)  Diverticular Disease  (ICD-562.10) 14)  Family History Diabetes 1st Degree Relative  (ICD-V18.0) 15)  Djd, Unspecified  (ICD-715.90)  Medications Prior to Update: 1)  Econazole Nitrate 1 %  Crea  (Econazole Nitrate) .... Apply To Affected Area Two Times A Day. Dispense 60 Grams 2)  Omeprazole 20 Mg Cpdr (Omeprazole) .... One Tablet By Mouth One To Two Times A Day As Needed For Reflux Symptoms 3)  Celexa 20 Mg Tabs (Citalopram Hydrobromide) .[name].. 1 Tab By Mouth Daily For Anxiety and Depression 4)  Tylenol Extra Strength 500 Mg Tabs (Acetaminophen) .... 2 Tabs By Mouth Three Times A Day Scheduled For Chronic Back Pain 5)  Oxycodone Hcl 5 Mg Tabs (Oxycodone Hcl) .[name].. 1 Tab By Mouth Q4h As Needed For Breakthrough Pain 6)  Flexeril 10 Mg Tabs (Cyclobenzaprine Hcl) .[name].. 1 Tab By Mouth At Bedtime As Needed For Muscle Spasms 7)  Ketoconazole 200 Mg Tabs (Ketoconazole) .... Take 1 Tab By Mouth Daily For Next 5 Days  Current Medications (verified): 1)  Econazole Nitrate 1 %  Crea (Econazole Nitrate) .... Apply To Affected Area Two Times A Day. Dispense 60 Grams 2)  Omeprazole 20 Mg Cpdr (Omeprazole) .... One Tablet By Mouth One To Two Times A Day As Needed For Reflux Symptoms 3)  Celexa 20 Mg Tabs (Citalopram Hydrobromide) .[name].. 1 Tab By Mouth Daily For Anxiety and Depression 4)  Tylenol Extra Strength 500 Mg Tabs (Acetaminophen) .... 2 Tabs By Mouth Three Times A  Day Scheduled For Chronic Back Pain 5)  Oxycodone Hcl 5 Mg Tabs (Oxycodone Hcl) .[name].. 1 Tab By Mouth Q4h As Needed For Breakthrough Pain 6)  Flexeril 10 Mg Tabs (Cyclobenzaprine Hcl) .[name].. 1 Tab By Mouth At Bedtime As Needed For Muscle Spasms 7)  Ketoconazole 200 Mg Tabs (Ketoconazole) .... Take 1 Tab By Mouth Daily For Next 5 Days 8)  Lidoderm 5 % Ptch (Lidocaine) .... Apply One Patch To Right Upper Quadrant Every 8 Hours As Needed Pain  Allergies (verified): 1)  Clarithromycin (Clarithromycin) 2)  * Gabapentin  Past History:  Past Medical History: Last updated: 01/02/2010 Needle stick injury 01/01/10, currently checking HepB/HepC/HIV at 6wk, 69mo, 27mo post-exposure.  PPx not indicated.  Grade A erosive esophagitis Hiatal  hernia GERD IBS Diverticulosis Kidney Stones-2005 Chronic neck and back pain  Past Surgical History: Last updated: 01/19/2009 Cyst removal from Jaw- 1996  Family History: Last updated: 01/19/2009 Family History Diabetes 1st degree relative mother GF- IDDM No premature MIs  Social History: Last updated: 01/19/2009 Lives w/ wife Cala Bradford) in [place] disabled due to low back pain Alcohol Use - no Daily Caffeine Use -1 Illicit Drug Use - no Best Contact# [phone]  Risk Factors: Alcohol Use: 0 (01/26/2010)  Risk Factors: Smoking Status: never (01/26/2010)  Family History: Reviewed history from 01/19/2009 and no changes required. Family History Diabetes 1st degree relative mother GF- IDDM No premature MIs  Social History: Reviewed history from 01/19/2009 and no changes required. Lives w/ wife Cala Bradford) in [place] disabled due to low back pain Alcohol Use - no Daily Caffeine Use -1 Illicit Drug Use - no Best Contact# [phone]  Review of Systems       The patient complains of weight gain and abdominal pain.  The patient denies fever, weight loss, chest pain, hemoptysis, melena, hematochezia, and hematuria.    Physical Exam  General:  Well-developed,well-nourished,in no acute distress; alert,appropriate and cooperative throughout examination Head:  Normocephalic and atraumatic without obvious abnormalities. No apparent alopecia or balding. Eyes:  No corneal or conjunctival inflammation noted. EOMI. Perrla. Funduscopic exam benign, without hemorrhages, exudates or papilledema. Vision grossly normal. Ears:  External ear exam shows no significant lesions or deformities.  Otoscopic examination reveals clear canals, tympanic membranes are intact bilaterally without bulging, retraction, inflammation or discharge. Hearing is grossly normal bilaterally. Nose:  External nasal examination shows no deformity or inflammation. Nasal mucosa are pink and moist  without lesions or exudates. Mouth:  Oral mucosa and oropharynx without lesions or exudates.  Teeth in good repair. Neck:  No deformities, masses, or tenderness noted. Chest Wall:  costochondrial tenderness.   Lungs:  Normal respiratory effort, chest expands symmetrically. Lungs are clear to auscultation, no crackles or wheezes. Heart:  Normal rate and regular rhythm. S1 and S2 normal without gallop, murmur, click, rub or other extra sounds. Abdomen:  Bowel sounds positive,abdomen soft and non-tender without masses, organomegaly or hernias noted.soft, no masses, no guarding, no rigidity, no rebound tenderness, abdominal scar(s), and RUQ tenderness.     Impression & Recommendations:  Problem # 1:  ABDOMINAL PAIN, CHRONIC (ICD-789.00) Assessment Deteriorated Will get HIDA scan to rule out Billiary Dyskinesia  Problem # 2:  RIB PAIN, RIGHT SIDED (ICD-786.50) Assessment: Deteriorated  Orders: Nuclear Medicine (Nuclear Medicine) [facility]- Est Level  3 ([address])  Added Lidocaine patches to see if this alleviates his pain.  Problem # 3:  GERD (ICD-530.81) Assessment: Unchanged  His updated medication list for this problem includes:    Omeprazole 20 Mg Cpdr (  Omeprazole) ..... One tablet by mouth one to two times a day as needed for reflux symptoms  Orders: [facility]- Est Level  3 ([address])  Complete Medication List: 1)  Econazole Nitrate 1 % Crea (Econazole nitrate) .... Apply to affected area two times a day. dispense 60 grams 2)  Omeprazole 20 Mg Cpdr (Omeprazole) .... One tablet by mouth one to two times a day as needed for reflux symptoms 3)  Celexa 20 Mg Tabs (Citalopram hydrobromide) .[name].. 1 tab by mouth daily for anxiety and depression 4)  Tylenol Extra Strength 500 Mg Tabs (Acetaminophen) .... 2 tabs by mouth three times a day scheduled for chronic back pain 5)  Oxycodone Hcl 5 Mg Tabs (Oxycodone hcl) .[name].. 1 tab by mouth q4h as needed for breakthrough pain 6)  Flexeril 10 Mg Tabs  (Cyclobenzaprine hcl) .[name].. 1 tab by mouth at bedtime as needed for muscle spasms 7)  Ketoconazole 200 Mg Tabs (Ketoconazole) .... Take 1 tab by mouth daily for next 5 days 8)  Lidoderm 5 % Ptch (Lidocaine) .... Apply one patch to right upper quadrant every 8 hours as needed pain   Patient Instructions: 1)  It was great meeting you. 2)  I hope your right sided pain gets better soon.  3)  try using the lidocaine patch, if that doesnt relieve your symptoms get the HIDA scan to evaluate your gallbladder. 4)  Please schedule a follow-up appointment in 1 month. Prescriptions: OMEPRAZOLE 20 MG CPDR (OMEPRAZOLE) one tablet by mouth one to two times a day as needed for reflux symptoms  #90 x 2   Entered and Authorized by:   Edd Arbour   Signed by:   Edd Arbour on 01/26/2010   Method used:   Faxed to ...       [facility] [address] # [phone]* (retail)       [address]       [place], [place]  [address]       Ph: [phone]       Fax: [phone]   RxID:   [id number] LIDODERM 5 % PTCH (LIDOCAINE) apply one patch to right upper quadrant every 8 hours as needed pain  #2 x 0   Entered and Authorized by:   Edd Arbour   Signed by:   Edd Arbour on 01/26/2010   Method used:   Faxed to ...       [facility] [address] # [phone]* (retail)       [address]       [place], [place]  [address]       Ph: [phone]       Fax: [phone]   RxID:   [id number]

## 2010-06-29 NOTE — Progress Notes (Signed)
Summary: refill   Phone Note Refill Request Call back at Home Phone [phone] Message from:  Patient  Refills Requested: Medication #1:  CELEXA 20 MG TABS 1 tab by mouth daily for anxiety and depression [facility] [place]  Initial call taken by: De Nurse,  February 01, 2010 10:26 AM  Follow-up for Phone Call        states he needs it because every time he tries to wean himself off it, he has "real bad" dizzy spells. he had been breaking them in half & weaning that way  told him I will fill the med & send this message to pcp. he wants to stop them but wants to avoid the dizziness Follow-up by: Golden Circle RN,  February 01, 2010 10:32 AM    Prescriptions: CELEXA 20 MG TABS (CITALOPRAM HYDROBROMIDE) 1 tab by mouth daily for anxiety and depression  #90 x 0   Entered by:   Golden Circle RN   Authorized by:   Renold Don MD   Signed by:   Golden Circle RN on 02/01/2010   Method used:   Electronically to        [facility] [address][id number]* (retail)       [address] [place], [place]  [address]       Ph: [phone]       Fax: [phone]   RxID:   [id number]

## 2010-07-05 NOTE — Assessment & Plan Note (Signed)
Summary: dizziness/ls   Vital Signs:  Patient profile:   40 year old male Height:      68.25 inches Weight:      221.5 pounds BMI:     33.55 Temp:     98.1 degrees F oral Pulse rate:   100 / minute BP sitting:   117 / 81  (left arm) Cuff size:   large  Vitals Entered By: Jimmy Footman, CMA (June 26, 2010 2:29 PM) CC: dizziness since coming off of celexa Is Patient Diabetic? No Pain Assessment Patient in pain? no        Primary Care Provider:  Renold Don MD  CC:  dizziness since coming off of celexa.  History of Present Illness: 1.  Dizziness:  Patient has been having what he describes as "eye twitching" since stopping Citalopram.  Had this previously when he stopped drug but it was worse then b/c he stopped immediately.  Now it is much less evident, but still noticeable.  States when he looks left or right he experiences a "shock" in his eyes and around his face.  Denies any symptoms of vertigo or light-headedness.  Worse when lying down at night or relaxing on the couch at the end of the day.  Has not tried anything to relieve this.  Only lasts about 30 seconds to a minute at the most.    2.  Anxiety:  Still having problems with anxiety, though only once or twice weekly.  Thinks its triggered by his dizzy spells, see above.  Describes palpitations, racing thoughts.  Denies chest pain, shortness of breath, syncopal or pre-syncopal episodes.    Habits & Providers  Alcohol-Tobacco-Diet     Tobacco Status: never  Current Problems (verified): 1)  Abdominal Pain, Chronic  (ICD-789.00) 2)  Rib Pain, Right Sided  (ICD-786.50) 3)  Tinea Versicolor  (ICD-111.0) 4)  Contact or Exposure To Other Viral Diseases  (ICD-V01.79) 5)  Dizziness  (ICD-780.4) 6)  Anxiety  (ICD-300.00) 7)  Cervical Radiculopathy, Left  (ICD-723.4) 8)  Health Maintenance Exam  (ICD-V70.0) 9)  Irritable Bowel Syndrome  (ICD-564.1) 10)  Gerd  (ICD-530.81) 11)  Esophagitis, Hx of  (ICD-V12.79) 12)  Colonic  Polyps, Hyperplastic  (ICD-211.3) 13)  Hiatal Hernia  (ICD-553.3) 14)  Diverticular Disease  (ICD-562.10) 15)  Family History Diabetes 1st Degree Relative  (ICD-V18.0) 16)  Djd, Unspecified  (ICD-715.90)  Current Medications (verified): 1)  Econazole Nitrate 1 %  Crea (Econazole Nitrate) .... Apply To Affected Area Two Times A Day. Dispense 60 Grams 2)  Omeprazole 40 Mg Cpdr (Omeprazole) .... One Capsule By Mouth Every Morning 3)  Tylenol Extra Strength 500 Mg Tabs (Acetaminophen) .... 2 Tabs By Mouth Three Times A Day Scheduled For Chronic Back Pain 4)  Oxycodone Hcl 5 Mg Tabs (Oxycodone Hcl) .[name].. 1 Tab By Mouth Q4h As Needed For Breakthrough Pain 5)  Flexeril 10 Mg Tabs (Cyclobenzaprine Hcl) .[name].. 1 Tab By Mouth At Bedtime As Needed For Muscle Spasms 6)  Lidoderm 5 % Ptch (Lidocaine) .... Apply One Patch To Right Upper Quadrant Every 8 Hours As Needed Pain 7)  Lorazepam 0.5 Mg Tabs (Lorazepam) .... Take 1 Pill Daily For Panic Attacks  Allergies (verified): 1)  Clarithromycin (Clarithromycin) 2)  * Gabapentin  Social History: Smoking Status:  never  Review of Systems       see HPI, otherwise negative  Physical Exam  General:  Vital signs reviewed. Well-developed, well-nourished patient in NAD.  Awake and cooperative  Head:  Normocephalic and atraumatic without obvious abnormalities. No apparent alopecia or balding. Eyes:  No corneal or conjunctival inflammation noted. EOMI. Perrla. Vision grossly normal. Ears:  External ear exam shows no significant lesions or deformities.  Otoscopic examination reveals clear canals, tympanic membranes are intact bilaterally without bulging, retraction, inflammation or discharge. Hearing is grossly normal bilaterally. Nose:  External nasal examination shows no deformity or inflammation. Nasal mucosa are pink and moist without lesions or exudates. Mouth:  Oral mucosa and oropharynx without lesions or exudates.  Teeth in good repair. Neck:  no  adenopathy  Lungs:  clear to auscultation bilaterally without wheezing, rales, or rhonchi.  Normal work of breathing  Heart:  Regular rate and rhythm without murmur, rub, or gallop.  Normal S1/S2  Neurologic:  No cranial nerve deficits noted. Station and gait are normal. Plantar reflexes are down-going bilaterally. DTRs are symmetrical throughout. Sensory, motor and coordinative functions appear intact.  No nystagmus noted   Impression & Recommendations:  Problem # 1:  DIZZINESS (ICD-780.4) Assessment New Unclear etiology. No vertigo or lightheaded symptoms.   Plan to check basic labs.   No red flags by history or exam.  Hopefully will resolve as the longer he is off Celexa Orders: Basic Met-[facility] ([address]109-60454) Hemoglobin-[facility] (09811) [facility]- Est  Level 4 (99214)Future Orders: A1C-[facility] (91478) ... 06/14/2011  Problem # 2:  ANXIETY (ICD-300.00) Assessment: Deteriorated short-term taper of lorazepam as he finishes his celexa The following medications were removed from the medication list:    Celexa 20 Mg Tabs (Citalopram hydrobromide) .[name]... Take 1/2 tab by mouth daily for anxiety and depression His updated medication list for this problem includes:    Lorazepam 0.5 Mg Tabs (Lorazepam) .[name]... Take 1 pill daily for panic attacks  Orders: [facility]- Est  Level 4 (29562)  Complete Medication List: 1)  Econazole Nitrate 1 % Crea (Econazole nitrate) .... Apply to affected area two times a day. dispense 60 grams 2)  Omeprazole 40 Mg Cpdr (Omeprazole) .... One capsule by mouth every morning 3)  Tylenol Extra Strength 500 Mg Tabs (Acetaminophen) .... 2 tabs by mouth three times a day scheduled for chronic back pain 4)  Oxycodone Hcl 5 Mg Tabs (Oxycodone hcl) .[name].. 1 tab by mouth q4h as needed for breakthrough pain 5)  Flexeril 10 Mg Tabs (Cyclobenzaprine hcl) .[name].. 1 tab by mouth at bedtime as needed for muscle spasms 6)  Lidoderm 5 % Ptch (Lidocaine) .... Apply one patch to right upper quadrant every 8  hours as needed pain 7)  Lorazepam 0.5 Mg Tabs (Lorazepam) .... Take 1 pill daily for panic attacks  Patient Instructions: 1)  Make an appt to meet with Dr. Pascal Lux for anxiety treatment.  2)  Take the Ativan one pill a day only if you are having anxiety issues at that time.   3)  We will check some blood tests today, I think they'll all be fine.  If they are abnormal, we'll give you a call.   4)  Good to see you today.  Prescriptions: LORAZEPAM 0.5 MG TABS (LORAZEPAM) Take 1 pill daily for panic attacks  #21 x 0   Entered and Authorized by:   Renold Don MD   Signed by:   Renold Don MD on 06/26/2010   Method used:   Print then Give to Patient   RxID:   [id number]    Orders Added: 1)  Basic Met-[facility] [[id number]] 2)  Hemoglobin-[facility] [85018] 3)  [facility]- Est  Level 4 [27253] 4)  A1C-[facility] [66440]  Laboratory Results   Blood Tests   Date/Time Received: June 26, 2010 4:24 PM  Date/Time Reported: June 26, 2010 4:34 PM     CBC   HGB:  15.5 g/dL   (Normal Range: 16.1-09.6 in Males, 12.0-15.0 in Females) Comments: venous sample ...............test performed by......Marland KitchenBonnie A. [place], MLS (ASCP)cm

## 2010-07-13 NOTE — Progress Notes (Signed)
Summary: phn msg   Phone Note Call from Patient Call back at Home Phone [phone]   Caller: Patient Summary of Call: meds are not working - thinks there is something behind his eyes that is causing problems Initial call taken by: De Nurse,  June 28, 2010 1:37 PM  Follow-up for Phone Call        pt calling again, told pt MD will not be here until Fri, pt expressed understanding & is asking that MD call him sometime tomorrow, pt vomitted today but thinks its b/c he stopped the medication.  Offered pt to be triaged, pt declined & said he could wait for MD to call him.  Follow-up by: Knox Royalty,  June 29, 2010 3:14 PM  Additional Follow-up for Phone Call Additional follow up Details #1::        Called this AM.  Rang twice then it sounded like phone was taken off hook.  Called back and left message for patient to call clinic.  Need more information regarding how he's feeling today, if continuing to vomit and have symptoms, needs to be seen.   Additional Follow-up by: Renold Don MD,  July 03, 2010 8:45 AM

## 2010-07-24 ENCOUNTER — Ambulatory Visit: Payer: Self-pay | Admitting: Family Medicine

## 2010-07-26 ENCOUNTER — Other Ambulatory Visit: Payer: Self-pay | Admitting: Family Medicine

## 2010-07-26 DIAGNOSIS — B360 Pityriasis versicolor: Secondary | ICD-10-CM

## 2010-07-26 NOTE — Telephone Encounter (Signed)
Please review and refill

## 2010-08-03 ENCOUNTER — Ambulatory Visit: Payer: Self-pay | Admitting: Family Medicine

## 2010-08-14 LAB — BASIC METABOLIC PANEL
BUN: 13 mg/dL (ref 6–23)
Creatinine, Ser: 1 mg/dL (ref 0.4–1.5)
GFR calc non Af Amer: >60 mL/min (ref >60)
Glucose, Bld: 112 mg/dL — ABNORMAL HIGH (ref 70–99)
Potassium: 4.4 mEq/L (ref 3.5–5.1)

## 2010-09-02 LAB — CBC
HCT: 41.7 % (ref 39.0–52.0)
HCT: 43 % (ref 39.0–52.0)
Hemoglobin: 14.4 g/dL (ref 13.0–17.0)
Hemoglobin: 14.9 g/dL (ref 13.0–17.0)
MCHC: 34.4 g/dL (ref 30.0–36.0)
MCHC: 34.7 g/dL (ref 30.0–36.0)
MCV: 89.4 fL (ref 78.0–100.0)
MCV: 89.7 fL (ref 78.0–100.0)
RBC: 4.65 MIL/uL (ref 4.22–5.81)
RBC: 4.82 MIL/uL (ref 4.22–5.81)
RDW: 13.1 % (ref 11.5–15.5)
WBC: 4.9 10*3/uL (ref 4.0–10.5)

## 2010-09-02 LAB — POCT CARDIAC MARKERS
CKMB, poc: <1 ng/mL — ABNORMAL LOW (ref 1.0–8.0)
Myoglobin, poc: 32 ng/mL (ref 12–200)

## 2010-09-02 LAB — URINE DRUGS OF ABUSE SCREEN W ALC, ROUTINE (REF LAB)
Amphetamine Screen, Ur: NEGATIVE
Cocaine Metabolites: NEGATIVE
Creatinine,U: 77.2 mg/dL
Methadone: NEGATIVE
Opiate Screen, Urine: NEGATIVE
Phencyclidine (PCP): NEGATIVE
Propoxyphene: NEGATIVE

## 2010-09-02 LAB — BASIC METABOLIC PANEL
BUN: 17 mg/dL (ref 6–23)
CO2: 25 mEq/L (ref 19–32)
Calcium: 9.2 mg/dL (ref 8.4–10.5)
Creatinine, Ser: 1.07 mg/dL (ref 0.4–1.5)
Creatinine, Ser: 1.09 mg/dL (ref 0.4–1.5)
GFR calc non Af Amer: >60 mL/min (ref >60)
GFR calc non Af Amer: >60 mL/min (ref >60)
Glucose, Bld: 113 mg/dL — ABNORMAL HIGH (ref 70–99)
Glucose, Bld: 78 mg/dL (ref 70–99)
Potassium: 4.1 mEq/L (ref 3.5–5.1)
Sodium: 139 mEq/L (ref 135–145)

## 2010-09-02 LAB — DIFFERENTIAL
Basophils Relative: 0 % (ref 0–1)
Eosinophils Absolute: 0 10*3/uL (ref 0.0–0.7)
Lymphs Abs: 2.3 10*3/uL (ref 0.7–4.0)
Monocytes Absolute: 0.6 10*3/uL (ref 0.1–1.0)
Monocytes Relative: 12 % (ref 3–12)

## 2010-09-02 LAB — PROTIME-INR: Prothrombin Time: 12.4 seconds (ref 11.6–15.2)

## 2010-09-06 LAB — POCT I-STAT, CHEM 8
BUN: 12 mg/dL (ref 6–23)
Chloride: 104 mEq/L (ref 96–112)
Creatinine, Ser: 1.3 mg/dL (ref 0.4–1.5)
Glucose, Bld: 90 mg/dL (ref 70–99)
Hemoglobin: 14.6 g/dL (ref 13.0–17.0)
Potassium: 3.9 mEq/L (ref 3.5–5.1)
Sodium: 140 mEq/L (ref 135–145)

## 2010-09-18 ENCOUNTER — Telehealth: Payer: Self-pay | Admitting: Gastroenterology

## 2010-09-18 NOTE — Telephone Encounter (Signed)
Pt c/o abdominal burning and cramping for 1 week.  Pain that is in his abdominal area.  Stools are hard and dark.  Has been taking Kao-pectate for the discomfort.  He will come in and see Amy Esterwood PA 09/19/10 9:00

## 2010-09-19 ENCOUNTER — Encounter: Payer: Self-pay | Admitting: Physician Assistant

## 2010-09-19 ENCOUNTER — Telehealth: Payer: Self-pay | Admitting: *Deleted

## 2010-09-19 ENCOUNTER — Ambulatory Visit (INDEPENDENT_AMBULATORY_CARE_PROVIDER_SITE_OTHER): Payer: Medicare Other | Admitting: Physician Assistant

## 2010-09-19 ENCOUNTER — Other Ambulatory Visit (INDEPENDENT_AMBULATORY_CARE_PROVIDER_SITE_OTHER): Payer: 59

## 2010-09-19 VITALS — BP 110/60 | HR 64 | Ht 69.0 in | Wt 226.0 lb

## 2010-09-19 DIAGNOSIS — K219 Gastro-esophageal reflux disease without esophagitis: Secondary | ICD-10-CM

## 2010-09-19 DIAGNOSIS — R1013 Epigastric pain: Secondary | ICD-10-CM

## 2010-09-19 DIAGNOSIS — K589 Irritable bowel syndrome without diarrhea: Secondary | ICD-10-CM

## 2010-09-19 LAB — CBC WITH DIFFERENTIAL/PLATELET
Basophils Relative: 0.4 % (ref 0.0–3.0)
Eosinophils Absolute: 0 10*3/uL (ref 0.0–0.7)
HCT: 43.6 % (ref 39.0–52.0)
Hemoglobin: 14.9 g/dL (ref 13.0–17.0)
Lymphocytes Relative: 46 % (ref 12.0–46.0)
Lymphs Abs: 2.4 10*3/uL (ref 0.7–4.0)
MCHC: 34.3 g/dL (ref 30.0–36.0)
MCV: 89.7 fl (ref 78.0–100.0)
Neutro Abs: 2.3 10*3/uL (ref 1.4–7.7)
RBC: 4.86 Mil/uL (ref 4.22–5.81)

## 2010-09-19 MED ORDER — PANTOPRAZOLE SODIUM 40 MG PO TBEC: 40.0000 mg | DELAYED_RELEASE_TABLET | Freq: Every day | ORAL | Status: DC

## 2010-09-19 NOTE — Progress Notes (Signed)
Addended byAudie Pinto, Loneta Tamplin on: 09/19/2010 04:07 PM   Modules accepted: Orders

## 2010-09-19 NOTE — Telephone Encounter (Signed)
Message copied by Graciella Freer on Tue Sep 19, 2010  3:59 PM ------      Message from: Monica Becton, [place]      Created: Tue Sep 19, 2010  1:17 PM       PLEASE LET Behr KNOW HIS LABS ARE NORMAL

## 2010-09-19 NOTE — Progress Notes (Signed)
  Subjective:    Patient ID: [name], male    DOB: February 28, 1971, 40 y.o.   MRN: [id number]  HPI [name] is a 40 year old white male known to Dr. Russella Dar who has history of chronic GERD and IBS, as well as history of colon polyps. He comes in today with complaints of one and a half to 2 weeks of fairly constant epigastric and right upper quadrant abdominal pain which he describes as burning and aching in nature. He says sometimes it is worse after eating sometimes not. He has had some associated nausea but no vomiting, no fever or chills. He has had an increase in gas and some recent constipation. He says his stools were somewhat dark last week but he had taken Kaopectate and his stools have been light this week. He had been taking chronic omeprazole but had stopped this a few months back as he was doing well and was trying to save some money. He does take ibuprofen 800 mg as needed and says he generally takes one about every other day. He is also been taking some BC powders recently for headaches perhaps 2 or 3 a day but not necessarily everyday. His appetite has been fine but his weight has been stable.    Review of Systems  Constitutional: Negative.   HENT: Negative.   Eyes: Negative.   Respiratory: Negative.   Cardiovascular: Negative.   Gastrointestinal: Positive for nausea and abdominal pain.  Genitourinary: Negative.   Musculoskeletal: Positive for back pain and arthralgias.  Skin: Negative.   Neurological: Negative.   Hematological: Negative.   Psychiatric/Behavioral: Negative.        Objective:   Physical Exam Well-developed white male in no acute distress, pleasant HEENT nontraumatic normocephalic EOMI PERRLA sclera anicteric  Neck; Supple no JVD  Cardiovascular; regular rate and rhythm with S1-S2 no murmur rub or gallop  Pulmonary; clear bilaterally  Abdomen; soft tender in the epigastrium no guarding no rebound no palpable mass or hepatosplenomegaly, bowel sounds active  Rectal  exam; not done Skin; benign  Psych; mood and affect normal an appropriate        Assessment & Plan:  #[age] year old male with 2 week history of epigastric and right-sided abdominal pain, intermittent nausea in the setting of history of chronic GERD and IBS. Given NSAID use need to rule out NSAID-induced peptic ulcer disease.  Plan; we'll check CBC today Schedule upper endoscopy with Dr. Russella Dar, procedure was discussed in detail with the patient and he is agreeable. Samples of Nexium 40 mg were given to the patient today, and he is asked to take Nexium twice daily until gone. Prescription for pantoprazole 40 mg once daily will be sent for chronic use, as he says his insurance will cover this. Discuss need to discontinue all NSAIDs at this time and aspirin products  #2 History of hyperplastic colon polyps Last colonoscopy October 2007, plan is for followup in 2017  #3 Chronic GERD He is to resume daily use of pantoprazole 40 mg by mouth daily as above.

## 2010-09-19 NOTE — Patient Instructions (Signed)
Endoscopy scheduled for Wed 09-20-2010. We sent a prescription for Pantoprazole Sodium, take 1 tab 30 min before breakfast.  We have given you samples of Nexium .

## 2010-09-19 NOTE — Telephone Encounter (Signed)
Notified pt per Dr Juanda Chance, his labs are normal. Pt stated understanding and reports he's feeling better today; he cancelled his ENDO, but he may call back soon to r/s. Instructed pt to call for further problems or questions.

## 2010-09-20 ENCOUNTER — Other Ambulatory Visit: Payer: 59 | Admitting: Gastroenterology

## 2010-09-20 NOTE — Progress Notes (Signed)
Reviewed and agree.

## 2010-10-10 ENCOUNTER — Telehealth: Payer: Self-pay | Admitting: Gastroenterology

## 2010-10-10 NOTE — Assessment & Plan Note (Signed)
[name]                         GASTROENTEROLOGY OFFICE NOTE   NAME:[name], [name]                       MRN:          [id number]  DATE:02/25/2007                            DOB:          [birth year]    PROBLEM:  Right upper quadrant pain.   HISTORY:  [name] is a pleasant 40 year old white male known to Dr.  Judie Petit T. Russella Dar, who had undergone workup about one year ago for  abdominal pain and some diarrhea.  He had a colonoscopy which was  negative.  He did have a small polyp and evidence of diverticular  disease.  Random biopsies were negative.  He also had endoscopy for  complaints of chronic gastroesophageal reflux disease and was found to  have a moderate reflux esophagitis and a 3 cm hiatal hernia.   The patient is disabled because of chronic back problems and says that  he generally has a lot of back pain, but had an episode about one month  ago with fairly severe back pain between his shoulder blades, which  lasted for about one week.  He said this felt different than his usual  back pain.  He also has over the past couple of months been noticing  intermittent sharp right upper quadrant pain.  He says he has had this  in the past, but it has been occurring more frequently.  He said he did  some reading and became concerned about possible gallbladder disease.  He has not had any nausea or vomiting.  He has had some occasional vague  nausea.  His appetite has been fine.  He does not definitely correlate  his right upper quadrant pain with p.o. intake.  He says it does feel  somewhat worse if he gets bloated.   He generally has problems with mild constipation, which is managed with  Dulcolax p.r.n.  The patient also relates increase in reflux symptoms  recently.  He said he had been doing well and has stopped taking the  Prilosec, but has had increased symptoms the past couple of weeks with  heartburn and occasional nocturnal throat  discomfort.  He has no  dysphagia or odynophagia.   CURRENT MEDICATIONS:  1. Vicodin 5/500 mg p.r.n. pain.  2. Ibuprofen occasionally.  Denies daily.  3. Flexeril p.r.n.  4. Dulcolax p.r.n.   ALLERGIES:  No known drug allergies.   PHYSICAL EXAMINATION:  GENERAL:  A well-developed white male, in no  acute distress.  VITAL SIGNS:  Weight 215 pounds, blood pressure 110/82, pulse 88.  HEENT:  Normocephalic and atraumatic.  EOMI.  Pupils equal, round,  reactive to light and accommodation.  Sclerae anicteric.  CARDIOVASCULAR:  A regular rate and rhythm with S1 and S2.  No murmur,  rub or gallop.  PULMONARY:  Clear to A&P.  ABDOMEN:  Soft, mildly tender in the right upper quadrant.  He is also  tender along the right costal margin.  There is no palpable  hepatosplenomegaly.  No guarding or rebound.  Bowel sounds are active.  RECTAL:  Not done today.   IMPRESSION:  57. A 40 year old male with intermittent  right upper quadrant pain, one      episode with radiation to the sub-scapular area.  Rule out possible      biliary colic/gallbladder disease.  Rule out musculoskeletal      etiology.  2. Gastroesophageal reflux disease.  3. Chronic back pain.   PLAN:  1. Resume PPI, which he had discontinued.  He was given samples of      Aciphex 20 mg p.o. daily and also a prescription today.  2. Check CBC and hepatic panel.  3. Checkup with an abdominal ultrasound.  4. Follow up with Dr. Judie Petit T. Russella Dar in the office in approximately      three weeks.      Mike Gip, PA-C  Electronically Signed      Hedwig Morton. Juanda Chance, MD  Electronically Signed   AE/MedQ  DD: 02/25/2007  DT: 02/26/2007  Job #: [id number]

## 2010-10-10 NOTE — Discharge Summary (Signed)
[name], TEICHERT NO.:  [id number]   MEDICAL RECORD NO.:  [id number]          PATIENT TYPE:  INP   LOCATION:  2004                         FACILITY:  [facility]   PHYSICIAN:  Pearlean Brownie, M.D.DATE OF BIRTH:  [birth year]   DATE OF ADMISSION:  01/15/2009  DATE OF DISCHARGE:  01/16/2009                               DISCHARGE SUMMARY   DISCHARGE DIAGNOSES:  1. Presyncope.  2. Chronic back pain.  3. Gastroesophageal reflux disease.   CONSULTS:  None.   PROCEDURES:  An EKG showed normal sinus rhythm with an apparent Mobitz  II type block, but this actually seemed to be a error.  Repeat EKG  showed no heart block.  Also, telemetry overnight showed no arrhythmias  or dropped complexes.  Chest x-ray 2-view showed no acute  cardiopulmonary process.  CT of the head without contrast showed no  significant abnormality.   LABORATORIES:  Point-of-care cardiac enzymes are negative x1 and cardiac  enzymes are negative x2.  Coags are within normal limits.  D-dimer is  negative.  CBC is within normal limits and a BMET is within normal  limits with a creatinine of 1.07.   BRIEF HOSPITAL COURSE:  This is 39 year old male who had an isolated  episode of presyncope.  He was admitted to the hospital because the  initial EKG in the emergency department showed a possible Mobitz type 2  heart block.  Of note, the EKG that showed this was suspect from the  beginning as it did show a T-wave after the dropped QRS complex.  No  further arrhythmias were seen.  During telemetry overnight, he  maintained sinus rhythm.  Cardiac workup is negative at this point, and  his risk factors are low.  He has no family history, no diabetes, or no  hypertension.  His lipid status is unknown at this time.  He was  provided with Mobic, Flexeril, and Tylenol for his back pain and is  discharged on the same.  He is given pantoprazole for GERD and heparin  for prophylaxis.  He is discharged to home  in stable medical condition.   DISCHARGE INSTRUCTIONS:  He is instructed to call his primary care  physician, Dr. Marisue Ivan at the [facility] to arrange for followup appointment within 1-2 weeks and to  arrange an exercise stress test at that time.      [name], MD  Electronically Signed      Pearlean Brownie, M.D.  Electronically Signed    MO/MEDQ  D:  01/16/2009  T:  01/17/2009  Job:  [id number]

## 2010-10-10 NOTE — H&P (Signed)
[name], [name] NO.:  [id number]   MEDICAL RECORD NO.:  [id number]          PATIENT TYPE:  INP   LOCATION:  2004                         FACILITY:  [facility]   PHYSICIAN:  [name], M.D.DATE OF BIRTH:  April 22, 1971   DATE OF ADMISSION:  01/15/2009  DATE OF DISCHARGE:                              HISTORY & PHYSICAL   PRIMARY CARE PHYSICIAN:  [name] Ivan, MD at the [facility].   CHIEF COMPLAINT:  Presyncope.   HISTORY OF PRESENT ILLNESS:  This is a 40 year old male presenting with  presyncope during dinner this evening.  He describes an emotionally  stressful day, after which he had one episode of lightheadedness without  vertigo while eating.  This was not associated with shortness of breath,  chest pain, or palpitations.  It resolved spontaneously without loss of  consciousness, and afterwards he felt nauseated, flushing, and had a  racing heart.  His wife reports that he turned white, did not lose  consciousness, and resolved spontaneously.   REVIEW OF SYSTEMS:  Per HPI and positive for back pain and neck pain,  and otherwise negative.   ALLERGIES:  No known drug allergies.   MEDICATIONS:  1. Mobic 7.5 mg p.o. daily p.r.n. back pain.  2. Flexeril 5 mg p.o. q.8 h. p.r.n. back pain.  3. Percocet one to two per month p.r.n. back pain.   PAST MEDICAL HISTORY:  1. GERD and esophagitis.  2. Nephrolithiasis.  3. Back pain and neck pain.   PAST SURGICAL HISTORY:  Removal of a jaw cyst.   FAMILY HISTORY:  Negative for sudden cardiac death and positive for CVA  in the grandparents.   SOCIAL HISTORY:  [place]  He is disabled for 5 years secondary to back pain.  He has  occasional alcohol use and denies tobacco and illicit drug use.   PHYSICAL EXAMINATION:  VITAL SIGNS:  Temperature 98.4, heart rate 92,  respiration rate 18, blood pressure 126/78, saturation 100%  on room air.  GENERAL:  Alert and oriented.  In no acute distress.  CARDIOVASCULAR:  Regular rate and rhythm with no murmurs, rubs, or  gallops, 2+ bilateral radial pulses and no carotid bruits.  RESPIRATIONS:  Clear to auscultation.  Normal work of breathing.  No  retractions or accessory muscle use.  ABDOMEN:  Normoactive bowel sounds, nontender, soft, no masses or  hepatosplenomegaly.  EXTREMITIES:  Nontender without edema.  NEUROLOGIC:  5/5 strength times all four extremities.   LABORATORIES AND RADIOLOGY:  Basic metabolic panel:  Sodium 140,  potassium 4.1, chloride 109, bicarb 23, BUN 17, creatinine 1.07, glucose  78, calcium 9.6.  CBC:  White blood count 4.9, hemoglobin 14.9,  hematocrit 43.0, platelets 259.  D-dimer less than 0.22.  PT, INR, and  PTT are all within normal limits.  Point-of-care cardiac enzymes are  negative x1.  Chest x-ray 2-view shows no acute cardiopulmonary process.  CT of the head without contrast, there is no significant abnormality.  EKG, normal sinus rhythm with apparent Mobitz type II block, but  with T-  wave after a dropped QRS complex.   ASSESSMENT AND PLAN:  This is a 40 year old male with presyncope.  1. Presyncope.  Likely benign given no evidence of Mobitz II on      telemetry in the ED; however, we will admit and monitor overnight      on telemetry.  Cycle cardiac enzymes.  Check urine drug screen.      Recheck EKG in the morning.  2. Back pain.  We will treat with p.r.n. doses of meloxicam, Flexeril,      and Tylenol.  3. Gastroesophageal reflux disease.  Proton pump inhibitor.  4. Fluids, electrolytes, nutrition/gastrointestinal.  Electrolytes are      within normal limits.  Hep-Lock IV fluids.  Regular diet.  Recheck      electrolytes in the morning.  5. Prophylaxis.  Heparin.   DISPOSITION:  If no telemetry events, we will discharge to home in the  morning.      [name] Belling, MD  Electronically Signed      [name],  M.D.  Electronically Signed    MO/MEDQ  D:  01/15/2009  T:  01/16/2009  Job:  [id number]

## 2010-10-10 NOTE — Telephone Encounter (Signed)
On call note @ 2245. Pt has had recurrent burning and cramping epigastric pain with gas. Saw AE on 09/19/10 and I reviewed that note. He felt better on Protonix and off ASA/NSAIDs after 3-4 days and so he cancelled the EGD. Has now rescheduled the EGD for Tuesday as the pain has returned for past 1-2 weeks. Epigastric pain has worsened over past few days. He remains on Protonix daily. Advised to increase Protonix to bid and use Mylanta or TUMS prn. If symptoms fail to improve consider an antispasmotic. Keep appt for EGD as planned.

## 2010-10-10 NOTE — Assessment & Plan Note (Signed)
[facility]                         GASTROENTEROLOGY OFFICE NOTE   NAME:[name], [name]                       MRN:          [id number]  DATE:08/26/2007                            DOB:          [birth year]    Mr. Mach complains of frequent substernal burning pain.  He stopped  taking Aciphex as previously recommended.  He relates no dysphagia,  odynophagia, melena, hematochezia, or weight loss.  Endoscopy in October  2007, revealed LA classification grade A erosive esophagitis and a small  hiatal hernia.   CURRENT MEDICATIONS:  1. Flexeril 2 daily.  2. Aspirin 81 mg daily.   MEDICATION ALLERGIES:  None known.   PHYSICAL EXAMINATION:  GENERAL:  No acute distress.  VITAL SIGNS:  Weight 218.4 pounds, blood pressure is 118/76, pulse 76  and regular.  HEENT:  Anicteric sclerae.  Oropharynx clear.  CHEST:  Clear to auscultation bilaterally.  CARDIAC:  Regular rate and rhythm without murmurs appreciated.  ABDOMEN:  Soft and nontender with normoactive bowel sounds.   ASSESSMENT/PLAN:  Gastroesophageal reflux disease with a history of  erosive esophagitis.  I discussed with him the chronic and relapsing  nature of gastroesophageal reflux disease and the need to remain on acid  medications and antireflux measures for adequate management give his  symptoms and erosive esophagitis.  Renew Aciphex 20 mg by mouth every  morning taken 30 minutes before breakfast.  Return office visit in 1  year.     Venita Lick. Russella Dar, MD, [facility]  Electronically Signed    MTS/MedQ  DD: 08/28/2007  DT: 08/28/2007  Job #: [id number]

## 2010-10-11 ENCOUNTER — Other Ambulatory Visit: Payer: Self-pay | Admitting: Gastroenterology

## 2010-10-11 ENCOUNTER — Encounter: Payer: Self-pay | Admitting: Gastroenterology

## 2010-10-11 ENCOUNTER — Ambulatory Visit (AMBULATORY_SURGERY_CENTER): Payer: 59 | Admitting: *Deleted

## 2010-10-11 VITALS — Ht 69.0 in | Wt 224.0 lb

## 2010-10-11 DIAGNOSIS — R1013 Epigastric pain: Secondary | ICD-10-CM

## 2010-10-11 MED ORDER — GLYCOPYRROLATE 2 MG PO TABS: 2.0000 mg | ORAL_TABLET | Freq: Two times a day (BID) | ORAL | Status: DC

## 2010-10-11 NOTE — Progress Notes (Signed)
Patient was here for a pre-visit he reported he spoke with Dr Russella Dar last night on the phone.  He is still having burning epigastric pain.  Patient is instructed to remain on Protonix BID, a bland diet and to start on Robinul bid per Mike Gip PA .  He is also asked to keep his EGD appt for next week.

## 2010-10-13 ENCOUNTER — Emergency Department (HOSPITAL_BASED_OUTPATIENT_CLINIC_OR_DEPARTMENT_OTHER)
Admission: EM | Admit: 2010-10-13 | Discharge: 2010-10-13 | Disposition: A | Discharge status: Home / Self Care | Payer: 59 | Attending: Emergency Medicine | Admitting: Emergency Medicine

## 2010-10-13 ENCOUNTER — Telehealth: Payer: Self-pay | Admitting: Gastroenterology

## 2010-10-13 DIAGNOSIS — K2990 Gastroduodenitis, unspecified, without bleeding: Secondary | ICD-10-CM | POA: Insufficient documentation

## 2010-10-13 DIAGNOSIS — M549 Dorsalgia, unspecified: Secondary | ICD-10-CM | POA: Insufficient documentation

## 2010-10-13 DIAGNOSIS — K2970 Gastritis, unspecified, without bleeding: Secondary | ICD-10-CM | POA: Insufficient documentation

## 2010-10-13 DIAGNOSIS — R1013 Epigastric pain: Secondary | ICD-10-CM | POA: Insufficient documentation

## 2010-10-13 DIAGNOSIS — G8929 Other chronic pain: Secondary | ICD-10-CM | POA: Insufficient documentation

## 2010-10-13 MED ORDER — HYOSCYAMINE SULFATE 0.125 MG SL SUBL: SUBLINGUAL_TABLET | SUBLINGUAL | Status: DC

## 2010-10-13 NOTE — Telephone Encounter (Signed)
Patient reports worsening pain in the epigastric area.  He states that it feels like "icy hot on my stomach".  Robinul and Protonix BID not helping.  I have discussed with Dr Russella Dar.  Per Dr Russella Dar since patient is not responding to tx he is not sure if this is GI related, but will call in Levsin 1-2 SL q 4 hours an needed for pain.   He is asked to keep the appt for EGD on Tuesday

## 2010-10-13 NOTE — Discharge Summary (Signed)
NAME:  [name]              ACCOUNT NO.:  [id number]   MEDICAL RECORD NO.:  [id number]           PATIENT TYPE:   LOCATION:                                 FACILITY:   PHYSICIAN:  Santiago Bumpers. Hensel, M.D.DATE OF BIRTH:  12/29/1970   DATE OF ADMISSION:  09/11/2006  DATE OF DISCHARGE:  09/11/2006                               DISCHARGE SUMMARY   DISCHARGE DIAGNOSES:  1. Rule out carotid dissection.  2. Cervical disk disease.   BRIEF HOSPITAL COURSE:  Please see H&P and [facility] for additional  detail.  In short, the patient was in ultrasound today for a carotid  Doppler in followup to a clinic visit in which he complained of headache  and dizziness.  Carotid Doppler was suggestive of carotid dissection.  The patient was admitted and prepared for MRI.  MRI was performed with  the subsequent impression that the patient had no acute stroke, bleed or  intracranial mass.  There was no visible dissection or plaque.  Radiologist did note that a small flap as detailed on ultrasound could  possibly go undetected on an MRA.  However, if the patient did indeed  have something like this it was of little likely consequence.  No  intracranial stenosis or occlusion noted.  MRI and radiologist's  impression was reviewed with [facility].  The  patient was deemed an inappropriate candidate for anticoagulation based  on these findings and recommendation was made to discharge the patient  home with close followup in clinic for continued evaluation of his  headaches and dizziness.   STUDIES AND SCANS:  1. Carotid Doppler as noted above.  2. MRI/MRA of the head and neck as noted above.   DISCHARGE MEDICATIONS:  Aspirin 81 mg daily.   FOLLOWUP:  The patient instructed to call the [facility] at  [phone] for followup appointment.  The patient was discharged at 10  p.m. in the evening and we were unable to make a followup appointment  for him prior to  discharge.   FOLLOWUP ISSUES:  The patient still has complaints of dizziness and  headache.  The patient was presumptively treated for acute otitis media  with antibiotics.  He has since finished this course and is still  complaining of dizziness.  The patient is due for a followup appointment  with his primary MD, Dr. Iven Finn, at the [facility].  The  patient is also to call [facility] to follow up with  them.  Barring relief from these two consultants, it may be appropriate  for the patient also see ENT for an additional recommendation.     [name], M.D.    ______________________________  Santiago Bumpers. Leveda Anna, M.D.   JP/MEDQ  D:  09/11/2006  T:  09/12/2006  Job:  [id number]

## 2010-10-13 NOTE — H&P (Signed)
[name], WEYER NO.:  [id number]   MEDICAL RECORD NO.:  [id number]           PATIENT TYPE:   LOCATION:                                 FACILITY:   PHYSICIAN:  [name], M.D.       DATE OF BIRTH:  27-Feb-1971   DATE OF ADMISSION:  09/11/2006  DATE OF DISCHARGE:                              HISTORY & PHYSICAL   DATE OF ADMISSION:  September 11, 2006   CHIEF COMPLAINT AND HISTORY OF PRESENT ILLNESS:  The patient was  recently seen for subacute neck pain and dizziness complaint in the  family practice center.  Just prior to that visit he was seen in the ED  for a similar complaint with a negative CT  of head and a normal EKG.  Subsequently the patient was sent for carotid Doppler ultrasound today  with initial findings suggestive of carotid dissection.  The patient was  admitted for ongoing management and diagnosis.  The patient says that  since he saw Dr. Remi Deter he has had intermittent dizziness with a numb  feeling from the top of his forehead to his nose.  This comes and goes  and is exacerbated by eating food and moving his jaw.  Notably, he says  that this symptom is unchanged since his visit with Dr. Remi Deter and he  notes no acute complaints outside of his ongoing dizziness.  Please see  [facility]R for complete details.   PAST MEDICAL HISTORY:  The patient is being seen for cervical disk  disease.  He is scheduled for vertebral fusion.  He notes a remote  myelogram of his spine.   PAST SURGICAL HISTORY:  1. MRI of the back April 2003.  2. Right great toe ingrown toenail removal August 2004.  No      significant surgical history otherwise.   FAMILY HISTORY:  Father deceased at age 47 from an MVA.  Mother still  living with diabetes, hypertension.  Maternal grandmother with MI in her  73's.   SOCIAL HISTORY:  The patient is married with two children.  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  He  denies  smoking.  Occasional ETOH two to three times per month.   REVIEW OF SYSTEMS:  Review of systems per HPI.  The patient denies  headache, visual changes, nausea, vomiting, diaphoresis, chest pain,  palpitations neuromotor focality, shortness of breath, changes in bowel  and bladder, leg pain or leg swelling.   PHYSICAL EXAMINATION:  GENERAL:  In general, the patient is well-  developed, well-nourished, in no acute distress.  He is alert,  appropriate and cooperative throughout the examination.  VITAL SIGNS:  Afebrile and vital signs stable.  HEENT:  Normocephalic, atraumatic without obvious abnormality.  Extraocular muscles intact.  Pupils equally round and reactive to light.  Funduscopic exam benign.  Vision grossly normal.  Ear exam without  significant lesions or deformities.  Clear canals.  Tympanic membranes  intact bilaterally.  Hearing grossly normal bilaterally.  NECK:  No deformities, masses, tenderness or bruits.  Normal range  of  motion.  LUNGS:  Normal respiratory effort.  Clear to auscultation bilaterally.  CARDIAC:  Normal rate and rhythm.  S1-S2 heard without gallop, murmur or  rub.  ABDOMEN:  Bowel sounds positive, soft, nontender, nondistended.  No  palpable organomegaly.  Pulse:  The left and right carotid full and  equal bilaterally.  EXTREMITIES:  No clubbing, cyanosis or edema.  NEUROLOGIC:  No cranial nerve deficits noted.  Normal station and gait.  Deep tendon reflexes symmetrical throughout.  No sensory motor focality.  PSYCHIATRIC:  The patient with intact cognition and judgment.  No  apparent delusions, illusions or hallucinations.   LABORATORY:  Carotid ultrasound impression:  Suspicious area in the bulb  with what appears to be a small flap suggestive of a carotid dissection.  The color flow patterns are normal as is the Doppler waveform at this  site.  Otherwise normal.   IMPRESSION AND RECOMMENDATIONS:  1. Rule out dissection of the carotid artery.  The  patient is      scheduled for MRI, MRA now.  [facility] have      been consulted as they saw the patient yesterday.  They will see      him in the hospital as a consult.  In the meantime, will start the      patient on p.o. aspirin.  In discussion with neurology, no      immediate indication for emergent anticoagulation.  2. The patient otherwise is nonacute at this time.  No ongoing medical      issues for which he is taking medicines.  Will follow up MRI and      reevaluate admission at that time.  3. Back pain:  The patient denies acute back pain at this time.  He is      able to ambulate without neuromotor focality.  Nonacute issue at      this time.      [name], M.D.     JP/MEDQ  D:  09/11/2006  T:  09/12/2006  Job:  [id number]

## 2010-10-13 NOTE — Assessment & Plan Note (Signed)
[name]                           GASTROENTEROLOGY OFFICE NOTE   NAME:[name], [name]                       MRN:          [id number]  DATE:01/29/2006                            DOB:          1971/05/23    HISTORY OF PRESENT ILLNESS:  This is a 40 year old male with a one-year  history of diarrhea.  He has a past history of constipation and notes  frequent lower abdominal pain that has worsened over the past three to four  months that has been associated with gas and bloating.  He has had problems  with heartburn and intermittent chest pain after meals for several years,  and he has taken Prilosec with some improvement in symptoms.  He presented  to the emergency room for evaluation on 09/13/2005 with nausea, vomiting and  diarrhea.  He states that he had an abdominal ultrasound performed in  [place] in 2005 that was unremarkable and a barium enema performed in  [place] in 2002 that was unremarkable.  In further discussing his  symptoms, it does appear that he has had intermittent lower abdominal pain  and intermittent right upper quadrant pain on and off for years, but the  symptoms have clearly worsened over the past several months.   PAST MEDICAL HISTORY:  Kidney stones in 2005.   PAST SURGICAL HISTORY:  Negative.   CURRENT MEDICATIONS:  Robaxin 500 mg p.r.n.   MEDICATIONS ALLERGIES:  No known drug allergies.   SOCIAL HISTORY:  Per the handwritten evaluation form.   REVIEW OF SYSTEMS:  Per the handwritten evaluation form.   PHYSICAL EXAMINATION:  GENERAL:  No acute distress.  VITAL SIGNS:  Height 5 feet 9 inches, weight 213.4 pounds, blood pressure  120/72, pulse 54 and regular.  HEENT:  Anicteric sclerae.  Oropharynx clear.  CHEST:  Clear to auscultation bilaterally.  CARDIAC:  Regular rate and rhythm without murmurs appreciated.  ABDOMEN:  Soft with mild lower abdominal tenderness to deep palpation.  No  rebound or  guarding.  No palpable organomegaly, masses or hernias.  Normoactive bowel sounds.  RECTAL:  Examination deferred to time of colonoscopy.   LABORATORY DATA:  Lab data from April 2007 revealed BMP, urinalysis and CBC  were all unremarkable.   ASSESSMENT AND PLAN:  Abdominal pain, reflux symptoms and diarrhea, rule out  irritable bowel syndrome, inflammatory bowel disease, gastroesophageal  reflux disease, ulcer disease and other disorders.  Continue Prilosec 20 mg  p.o. q. a.m. and begin all standard antireflux measures.  Risks, benefits  and alternatives to colonoscopy with possible biopsy and possible  polypectomy and upper endoscopy with possible biopsy discussed with the  patient.  He consents to proceed.  This will be scheduled electively.  If  there are no significant findings, consider proceeding with abdominal  ultrasound or CT scan of the abdomen and pelvis.                                   Venita Lick. Russella Dar, MD, [facility]   MTS/MedQ  DD:  02/13/2006  DT:  02/15/2006  Job #:  [id number]

## 2010-10-13 NOTE — Consult Note (Signed)
[name], [name]              ACCOUNT NO.:  [id number]   MEDICAL RECORD NO.:  [id number]          PATIENT TYPE:  INP   LOCATION:  5502                         FACILITY:  [facility]   PHYSICIAN:  Gustavus Messing. Orlin Hilding, M.D.DATE OF BIRTH:  [birth year]   DATE OF CONSULTATION:  09/11/2006  DATE OF DISCHARGE:  09/11/2006                                 CONSULTATION   NEUROLOGY CONSULTATION:   CHIEF COMPLAINT:  Headache and dizziness, rule out carotid dissection on  the right.   HISTORY OF PRESENT ILLNESS:  [name] is a 40 year old left-handed  white male with no significant past medical history except for back pain  who was just seen yesterday by Dr. Pearlean Brownie in the office.  He has a one-  month history of head pain on the top of his head and episodes of vague  dizziness and neck pain.  He has been seen numerous times in the  emergency room.  He was eventually seen by family medicine.  This  Saturday, he also felt like he had some numbness on the right side of  his face.  He was seen yesterday by Dr. Pearlean Brownie in the office, the notes  are not yet available.  He had a normal exam and an outpatient carotid  Doppler study was ordered, that was done this afternoon and a possible  intimal flap on the right ICA was reported.  The patient was admitted  for MR evaluation of that.  I do not actually have the report of the  carotid Doppler study.   REVIEW OF SYSTEMS:  Positive for the intermittent headaches, dizziness,  neck pain, back pain.  No paralysis.   PAST MEDICAL HISTORY:  Significant for::  1. Chronic back pain.  2. Jaw surgery for some kind of a cyst.  3. Kidney stones.   MEDICATIONS:  Methocarbamol, Darvocet and Ultram.  He has been taking a  baby aspirin twice a day since he was first instructed to in the  emergency room.   ALLERGIES:  No known drug allergies.   SOCIAL HISTORY:  No cigarette use, no IV drug abuse or other drug abuse,  occasional alcohol.   FAMILY HISTORY:   Positive for stroke and diabetes.   PHYSICAL EXAMINATION:  MENTAL STATUS:  He is awake and alert, fully  appropriate with normal language and cognition.  HEENT:  Cranial nerves:  Pupils are equal and reactive.  Visual fields  are full to confrontation.  Extraocular movements are intact.  Facial  sensation is normal at this time.  Facial motor activity is normal  without droop.  Hearing is intact, palate symmetric and tongue is  midline.  MOTOR EXAM:  He has normal station and gait.  He has no drift or  satellite and normal rapid fine movements.  Normal bulk, tone and  strength, 3/5 strength and good grips.  No tremor.  REFLEXES:  Deep tendon reflexes are 2+.  Downgoing toes.  Coordination:  Finger-to-nose, heel-to-shin and tandem gait are normal.  SENSORY EXAM:  Normal.   By report, the carotid Doppler shows an intimal flap which may be small  in the right carotid artery; I do not have that information.   IMPRESSION:  Possible right carotid dissection by history with headache,  neck pain and abnormal carotid Doppler, although I do not have that,  suggesting right carotid intimal flap.  Patient has a normal neurologic  exam.   RECOMMENDATIONS:  Agree with MRI scan of the brain, MRA of the head and  neck.  Continue aspirin for now unless a large mobile flap is present;  then likely he will need heparin to Coumadin.      Catherine A. Orlin Hilding, M.D.  Electronically Signed     CAW/MEDQ  D:  09/11/2006  T:  09/11/2006  Job:  [id number]

## 2010-10-16 ENCOUNTER — Encounter: Payer: Self-pay | Admitting: Gastroenterology

## 2010-10-17 ENCOUNTER — Encounter: Payer: Self-pay | Admitting: Gastroenterology

## 2010-10-17 ENCOUNTER — Ambulatory Visit (AMBULATORY_SURGERY_CENTER): Payer: 59 | Admitting: Gastroenterology

## 2010-10-17 VITALS — BP 127/62 | HR 69 | Temp 98.0°F | Resp 13 | Ht 69.0 in | Wt 223.0 lb

## 2010-10-17 DIAGNOSIS — K219 Gastro-esophageal reflux disease without esophagitis: Secondary | ICD-10-CM

## 2010-10-17 DIAGNOSIS — K2940 Chronic atrophic gastritis without bleeding: Secondary | ICD-10-CM

## 2010-10-17 DIAGNOSIS — R1013 Epigastric pain: Secondary | ICD-10-CM

## 2010-10-17 MED ORDER — SODIUM CHLORIDE 0.9 % IV SOLN: 500.0000 mL | INTRAVENOUS | Status: DC

## 2010-10-17 NOTE — Patient Instructions (Signed)
Continue PPI ?

## 2010-10-18 ENCOUNTER — Telehealth: Payer: Self-pay

## 2010-10-18 NOTE — Telephone Encounter (Signed)

## 2010-10-20 ENCOUNTER — Telehealth: Payer: Self-pay | Admitting: Gastroenterology

## 2010-10-20 NOTE — Telephone Encounter (Signed)
Pt wanted biopsy results. Notified patient that the results are not back yet. Pt verbalized understanding.

## 2010-10-25 ENCOUNTER — Encounter: Payer: Self-pay | Admitting: Gastroenterology

## 2010-10-26 ENCOUNTER — Telehealth: Payer: Self-pay | Admitting: Gastroenterology

## 2010-10-26 NOTE — Telephone Encounter (Signed)
I reviewed bx results with the patient's wife.  She is advised that they should be getting a letter in the mail also.

## 2010-11-16 ENCOUNTER — Ambulatory Visit: Payer: 59 | Admitting: Family Medicine

## 2010-11-20 ENCOUNTER — Ambulatory Visit: Payer: 59 | Admitting: Family Medicine

## 2010-12-15 ENCOUNTER — Encounter: Payer: Self-pay | Admitting: Family Medicine

## 2010-12-15 ENCOUNTER — Ambulatory Visit (INDEPENDENT_AMBULATORY_CARE_PROVIDER_SITE_OTHER): Payer: 59 | Admitting: Family Medicine

## 2010-12-15 VITALS — BP 118/81 | HR 65 | Temp 98.1°F | Ht 69.0 in | Wt 225.0 lb

## 2010-12-15 DIAGNOSIS — F411 Generalized anxiety disorder: Secondary | ICD-10-CM

## 2010-12-15 DIAGNOSIS — W461XXA Contact with contaminated hypodermic needle, initial encounter: Secondary | ICD-10-CM

## 2010-12-15 DIAGNOSIS — Z7721 Contact with and (suspected) exposure to potentially hazardous body fluids: Secondary | ICD-10-CM

## 2010-12-15 DIAGNOSIS — M5412 Radiculopathy, cervical region: Secondary | ICD-10-CM

## 2010-12-15 DIAGNOSIS — W460XXA Contact with hypodermic needle, initial encounter: Secondary | ICD-10-CM

## 2010-12-15 DIAGNOSIS — Z8719 Personal history of other diseases of the digestive system: Secondary | ICD-10-CM

## 2010-12-15 LAB — COMPREHENSIVE METABOLIC PANEL
ALT: 42 U/L (ref 0–53)
AST: 25 U/L (ref 0–37)
Albumin: 4.8 g/dL (ref 3.5–5.2)
BUN: 11 mg/dL (ref 6–23)
CO2: 28 mEq/L (ref 19–32)
Calcium: 10 mg/dL (ref 8.4–10.5)
Chloride: 103 mEq/L (ref 96–112)
Potassium: 4 mEq/L (ref 3.5–5.3)

## 2010-12-15 MED ORDER — LORAZEPAM 0.5 MG PO TABS: ORAL_TABLET | ORAL | Status: DC

## 2010-12-15 NOTE — Assessment & Plan Note (Signed)
Limit Ibuprofen. Continue PPI
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
## 2010-12-15 NOTE — Assessment & Plan Note (Signed)
Persists. Nothing noted on exam today. Reassured patient

## 2010-12-15 NOTE — Progress Notes (Signed)
Addended by: Tetonia Cellar on: 12/15/2010 01:57 PM   Modules accepted: Orders

## 2010-12-15 NOTE — Assessment & Plan Note (Signed)
Worsening.   Long discussion with patient regarding treatment options.  Brought in pill bottle of Lorazepam prescribed back in January, still half full.  Celexa worked for him in past, however he doesn't like taking long term meds. Will treat with 3 week course of Lorazepam, then see him back.  May need long term SSRI, patient unsure if amenable to this, will discuss next visit in 4 weeks.   Gave strict warning signs, reasons to return to clinic/ED regarding anxiety and chest pain

## 2010-12-15 NOTE — Patient Instructions (Signed)
Take 1 tab po bid x 14 days, then 1 tab po daily for 1 week. Come back and see me in 4 weeks

## 2010-12-15 NOTE — Assessment & Plan Note (Signed)
Repeat tests, if negative, no further testing needed

## 2010-12-16 ENCOUNTER — Encounter: Payer: Self-pay | Admitting: Family Medicine

## 2010-12-18 ENCOUNTER — Ambulatory Visit: Payer: 59 | Admitting: Family Medicine

## 2010-12-18 LAB — HEPATITIS PANEL, ACUTE: Hep A IgM: NEGATIVE

## 2010-12-21 ENCOUNTER — Telehealth: Payer: Self-pay | Admitting: Family Medicine

## 2010-12-21 NOTE — Telephone Encounter (Signed)
States that the Ativan is not working and wants to know what to do since Dr Gwendolyn Grant is not here.  Would like to talk to nurse

## 2010-12-21 NOTE — Telephone Encounter (Addendum)
Patient states he is continuing to have anxiety attacks, feels dizzy, vision blurred, headache. All these symptoms were occurring at last visit.  He is still undecided about SSRI but feels he can't continue with the anxiety he is feeling . He is taking the Ativan as directed but not really helping.  Denies chest pain.  Feels he can't wait to see Dr. Gwendolyn Grant at his first available appointment next week.   Appointment scheduled tomorrow  to talk with MD about anxiety and possibly starting back on celexa or a different medication.

## 2010-12-22 ENCOUNTER — Ambulatory Visit (INDEPENDENT_AMBULATORY_CARE_PROVIDER_SITE_OTHER): Payer: 59 | Admitting: Family Medicine

## 2010-12-22 ENCOUNTER — Encounter: Payer: Self-pay | Admitting: Family Medicine

## 2010-12-22 VITALS — BP 108/78 | HR 80 | Temp 98.7°F | Ht 69.0 in | Wt 225.0 lb

## 2010-12-22 DIAGNOSIS — F411 Generalized anxiety disorder: Secondary | ICD-10-CM

## 2010-12-22 DIAGNOSIS — R51 Headache: Secondary | ICD-10-CM

## 2010-12-22 MED ORDER — CITALOPRAM HYDROBROMIDE 20 MG PO TABS: 10.0000 mg | ORAL_TABLET | ORAL | Status: DC

## 2010-12-22 NOTE — Progress Notes (Signed)
S: Pt comes in today for worsening anxiety and continued head pressure/neck pain.  Continuing to have panic attacks where he gets short of breath, shakey, sweaty; sometimes has dizziness and pressure in his head which is sometimes associated with these panic attacks and sometimes come without the other anxiety symptoms.  Occasionally has vision changes but is going back to his eye doctor to have this looked into.  Knows that he cannot continue like this; is impairing his quality of life.  Does not like to go out or drive.  Frequency has been increasing over the past 1-2 months and knows that he needs to do something for this.  Does not feel like Ativan is helping-- tried taking one BID but did not have any effect; needs to take 3-4 for an anxiety relief.  Unsure what else to do. Is leery about restarting celexa b/c he is worried about health risks and interactions with his other medications.  Tolerated it well when he was on it previously for ~6 months with only side effect being decreased libido/sexual dysfunction, but this does not seem to concern him at this point.    ROS: Per HPI  History  Smoking status  . Former Smoker  Smokeless tobacco  . Never Used    O:  Filed Vitals:   12/22/10 1003  BP: 108/78  Pulse: 80  Temp: 98.7 F (37.1 C)    Gen: NAD HEENT: bilateral fundi WNL, full neck ROM CV: RRR, no murmur, no irregularity Pulm: CTA bilat, no wheezes or crackles    A/P: 40 y.o. male p/w worsening anxiety and continued head pressure -See problem list -f/u in 1-2 weeks with PCP

## 2010-12-22 NOTE — Assessment & Plan Note (Signed)
Sounds c/w anxiety.  No obvious increased ICP, fundi normal.  Neuro exam grossly intact.  Pt mentating well.  Will continue to follow and see if this improves with decreased anxiety.

## 2010-12-22 NOTE — Patient Instructions (Signed)
Let's restart the Celexa at 10mg  for the next 10 days then we will increase it to 20mg . You can continue taking the ativan twice per day while we are getting the celexa into your system. Make an appt to see Dr. Gwendolyn Grant in the next week or so to see how you are doing.

## 2010-12-22 NOTE — Assessment & Plan Note (Signed)
Pt continuing to have frequent panic attacks/ anxiety episodes.  Have been worsening.  Pt does not think ativan is helping-- needing to take 3-4 to feel any calming affect.  Amenable to restart Celexa at this time.  Will start on 10mg  x10 days then increase back to previous 20mg .  Given warning signs and reasons to go to ED or come back to clinic.  Pt states understanding.  No SI/HI.  F/u with PCP in 1-2 weeks to check in and see how anixety is doing.  Advised pt that he could continue taking Ativan while waiting for celexa to start working/become therapeutic.

## 2010-12-25 ENCOUNTER — Ambulatory Visit (INDEPENDENT_AMBULATORY_CARE_PROVIDER_SITE_OTHER): Payer: 59 | Admitting: General Surgery

## 2010-12-29 ENCOUNTER — Telehealth: Payer: Self-pay | Admitting: Family Medicine

## 2010-12-29 ENCOUNTER — Other Ambulatory Visit: Payer: Self-pay | Admitting: Family Medicine

## 2010-12-29 DIAGNOSIS — F411 Generalized anxiety disorder: Secondary | ICD-10-CM

## 2010-12-29 MED ORDER — CITALOPRAM HYDROBROMIDE 20 MG PO TABS: 10.0000 mg | ORAL_TABLET | ORAL | Status: DC

## 2010-12-29 NOTE — Telephone Encounter (Signed)
Sent in order and alerted clinical staff to call patient.

## 2010-12-29 NOTE — Telephone Encounter (Signed)
[facility] in [place] did not receive the [facility] Rx for Celexa, can it be sent again or faxed and please call the patient when this is done.

## 2011-01-01 ENCOUNTER — Telehealth: Payer: Self-pay | Admitting: Family Medicine

## 2011-01-01 NOTE — Telephone Encounter (Signed)
RX called to pharmacy.

## 2011-01-01 NOTE — Telephone Encounter (Signed)
states that the pharm did not rec fax for his celexa

## 2011-01-02 ENCOUNTER — Emergency Department (HOSPITAL_COMMUNITY)
Admission: EM | Admit: 2011-01-02 | Discharge: 2011-01-03 | Disposition: A | Discharge status: Home / Self Care | Payer: 59 | Attending: Emergency Medicine | Admitting: Emergency Medicine

## 2011-01-02 DIAGNOSIS — Z203 Contact with and (suspected) exposure to rabies: Secondary | ICD-10-CM | POA: Insufficient documentation

## 2011-01-02 DIAGNOSIS — Z23 Encounter for immunization: Secondary | ICD-10-CM | POA: Insufficient documentation

## 2011-01-02 DIAGNOSIS — Z79899 Other long term (current) drug therapy: Secondary | ICD-10-CM | POA: Insufficient documentation

## 2011-01-03 ENCOUNTER — Ambulatory Visit (INDEPENDENT_AMBULATORY_CARE_PROVIDER_SITE_OTHER): Payer: 59 | Admitting: General Surgery

## 2011-01-03 ENCOUNTER — Ambulatory Visit: Payer: 59

## 2011-01-05 ENCOUNTER — Inpatient Hospital Stay (INDEPENDENT_AMBULATORY_CARE_PROVIDER_SITE_OTHER)
Admission: RE | Admit: 2011-01-05 | Discharge: 2011-01-05 | Disposition: A | Discharge status: Home / Self Care | Payer: 59 | Source: Ambulatory Visit | Attending: Family Medicine | Admitting: Family Medicine

## 2011-01-05 DIAGNOSIS — Z23 Encounter for immunization: Secondary | ICD-10-CM

## 2011-01-09 ENCOUNTER — Inpatient Hospital Stay (INDEPENDENT_AMBULATORY_CARE_PROVIDER_SITE_OTHER)
Admission: RE | Admit: 2011-01-09 | Discharge: 2011-01-09 | Disposition: A | Discharge status: Home / Self Care | Payer: 59 | Source: Ambulatory Visit | Attending: Emergency Medicine | Admitting: Emergency Medicine

## 2011-01-09 DIAGNOSIS — Z23 Encounter for immunization: Secondary | ICD-10-CM

## 2011-01-11 ENCOUNTER — Ambulatory Visit: Payer: 59 | Admitting: Family Medicine

## 2011-01-16 ENCOUNTER — Ambulatory Visit (INDEPENDENT_AMBULATORY_CARE_PROVIDER_SITE_OTHER): Payer: 59 | Admitting: General Surgery

## 2011-01-16 ENCOUNTER — Inpatient Hospital Stay (INDEPENDENT_AMBULATORY_CARE_PROVIDER_SITE_OTHER)
Admission: RE | Admit: 2011-01-16 | Discharge: 2011-01-16 | Disposition: A | Discharge status: Home / Self Care | Payer: 59 | Source: Ambulatory Visit | Attending: Family Medicine | Admitting: Family Medicine

## 2011-01-16 DIAGNOSIS — Z23 Encounter for immunization: Secondary | ICD-10-CM

## 2011-01-17 ENCOUNTER — Ambulatory Visit: Payer: 59

## 2011-01-24 ENCOUNTER — Emergency Department (HOSPITAL_COMMUNITY)
Admission: EM | Admit: 2011-01-24 | Discharge: 2011-01-25 | Disposition: A | Discharge status: Home / Self Care | Payer: 59 | Attending: Emergency Medicine | Admitting: Emergency Medicine

## 2011-01-24 ENCOUNTER — Emergency Department (HOSPITAL_COMMUNITY): Payer: 59

## 2011-01-24 DIAGNOSIS — R071 Chest pain on breathing: Secondary | ICD-10-CM | POA: Insufficient documentation

## 2011-01-24 DIAGNOSIS — M549 Dorsalgia, unspecified: Secondary | ICD-10-CM | POA: Insufficient documentation

## 2011-01-24 DIAGNOSIS — G8929 Other chronic pain: Secondary | ICD-10-CM | POA: Insufficient documentation

## 2011-01-24 DIAGNOSIS — M79609 Pain in unspecified limb: Secondary | ICD-10-CM | POA: Insufficient documentation

## 2011-01-24 DIAGNOSIS — Z79899 Other long term (current) drug therapy: Secondary | ICD-10-CM | POA: Insufficient documentation

## 2011-01-24 LAB — DIFFERENTIAL
Basophils Relative: 0 % (ref 0–1)
Eosinophils Absolute: 0 10*3/uL (ref 0.0–0.7)
Eosinophils Relative: 0 % (ref 0–5)
Monocytes Relative: 6 % (ref 3–12)
Neutrophils Relative %: 41 % — ABNORMAL LOW (ref 43–77)

## 2011-01-24 LAB — CBC
Platelets: 271 10*3/uL (ref 150–400)
RBC: 4.76 MIL/uL (ref 4.22–5.81)
WBC: 6.4 10*3/uL (ref 4.0–10.5)

## 2011-01-24 LAB — BASIC METABOLIC PANEL
CO2: 28 mEq/L (ref 19–32)
Chloride: 104 mEq/L (ref 96–112)
Sodium: 140 mEq/L (ref 135–145)

## 2011-01-25 ENCOUNTER — Ambulatory Visit (INDEPENDENT_AMBULATORY_CARE_PROVIDER_SITE_OTHER): Payer: 59 | Admitting: Family Medicine

## 2011-01-25 ENCOUNTER — Encounter: Payer: Self-pay | Admitting: Family Medicine

## 2011-01-25 DIAGNOSIS — J309 Allergic rhinitis, unspecified: Secondary | ICD-10-CM | POA: Insufficient documentation

## 2011-01-25 DIAGNOSIS — G8929 Other chronic pain: Secondary | ICD-10-CM

## 2011-01-25 DIAGNOSIS — M5412 Radiculopathy, cervical region: Secondary | ICD-10-CM

## 2011-01-25 LAB — POCT I-STAT TROPONIN I: Troponin i, poc: 0 ng/mL (ref 0.00–0.08)

## 2011-01-25 MED ORDER — FLUTICASONE PROPIONATE 50 MCG/ACT NA SUSP: 2.0000 | Freq: Every day | NASAL | Status: DC

## 2011-01-25 MED ORDER — PREGABALIN 50 MG PO CAPS: 50.0000 mg | ORAL_CAPSULE | Freq: Three times a day (TID) | ORAL | Status: DC

## 2011-01-25 NOTE — Assessment & Plan Note (Signed)
Exam and history consistent with allergic rhinitis. Will start on flonase. Advised to use alternate antihistamine with medication (claritin, allegra).

## 2011-01-25 NOTE — Assessment & Plan Note (Signed)
Will start pt on lyrica for treatment of neuropathic pain. Would like to avoid TCAs as to avoid any interactions with celexa (Pt is noted to have multiple self reported interactions with multiple medications). If pt cannot afford, nortriptyline my be an option.

## 2011-01-25 NOTE — Patient Instructions (Signed)
It was good to see you today  I am starting you on flonase for your allergies It will take about 2 weeks to start working For your chronic shoulder pain, I have placed you on lyrica If you cannot afford this medication, we may try something else Follow up with Dr. Gwendolyn Grant in 3-4 weeks about your symptoms Call with any questions,  God Bless, Doree Albee MD
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
## 2011-02-01 ENCOUNTER — Ambulatory Visit: Payer: 59 | Admitting: Family Medicine

## 2011-02-08 ENCOUNTER — Encounter: Payer: Self-pay | Admitting: Family Medicine

## 2011-02-08 ENCOUNTER — Ambulatory Visit (INDEPENDENT_AMBULATORY_CARE_PROVIDER_SITE_OTHER): Payer: 59 | Admitting: Family Medicine

## 2011-02-08 DIAGNOSIS — R6884 Jaw pain: Secondary | ICD-10-CM

## 2011-02-08 DIAGNOSIS — R51 Headache: Secondary | ICD-10-CM

## 2011-02-08 DIAGNOSIS — R599 Enlarged lymph nodes, unspecified: Secondary | ICD-10-CM | POA: Insufficient documentation

## 2011-02-08 DIAGNOSIS — R42 Dizziness and giddiness: Secondary | ICD-10-CM

## 2011-02-08 NOTE — Assessment & Plan Note (Signed)
Persists. No history and no signs or symptoms of focal deficits. No changes in hearing. No actual ear pain. Please see jaw pain above. Much more likely to be peripheral rather than central process. Status has actually improved since starting Celexa. Possible anxiety component. 2 continue to try meclizine for improvement. Will also have ENT followup on this as well.

## 2011-02-08 NOTE — Assessment & Plan Note (Signed)
Much improved since starting Flonase to plan to continue Flonase.

## 2011-02-08 NOTE — Assessment & Plan Note (Signed)
Unable to appreciate on exam today. We'll have ENT weigh in on this as well.

## 2011-02-08 NOTE — Assessment & Plan Note (Signed)
Sounds like combination of TMJ and possible trigeminal neuralgia. More likely TMJ. Patient requests ENT referral We'll refer her for further evaluation to rule out TMJ or trigeminal neuralgia. May benefit from tricyclics. Followup after ENT appointment

## 2011-02-08 NOTE — Progress Notes (Signed)
  Subjective:    Patient ID: [name], male    DOB: 01/15/1971, 40 y.o.   MRN: [id number]  HPI 1.  Shooting pains Right jaw:  Present for past month or so. Describes shooting pains along right jaw originating in front of right ear. No consistent triggers for pain.  Occasionally with soreness in right ear along jaw when opening his mouth wide with chewing. Describes shooting pains as lightening-like pain that burns and then remits.  No numbness or weakness.  Does have occasional perioral paresthesias.    2.  Swollen glands:  Has felt some swollen glands in his submandibular region.  Has also complained of swollen tonsils in past but not currently.  Concerned because sometimes food gets stuck within recesses of tonsils. This causes him to have halitosis and has to manually remove food from tonsils. Had swollen glands in the submandibular region as hard and firm to touch. No pain no tonsillar pain currently.  3.  Difficulty breathing:  Followup for this. Previously given Flonase at prior office visit. States is much improved. Breathing was previously hindered by swollen nasal turbinates. Since using his Flonase on twice a day dosing he feels much better. No current rhinorrhea nor cough symptoms.  4.  Vertigo symptoms:  Still present but improved after taking Celexa. Though has a few Ativan leftover but has not needed these. Has somewhat improved with meclizine but still present. Would like ENT referral to discuss this. No actual syncopal episodes. No falls   Review of Systems See HPI above for review of systems.       Objective:   Physical Exam Gen:  Alert, cooperative patient who appears stated age in no acute distress.  Vital signs reviewed. Head:  [place]/AT.   Eyes: EOMI, PERRL.   Ears:  Soreness upon palpation of Right TMJ joint.  No pain with palpation of Left.  Denies any tingling or paresthesias with palpation of TMJ or trigeminal area.  External ears WNL, Bilateral TM's normal without  retraction, redness or bulging.  Nose:  Nasal turbinates with only minimal edema. No erythema. No exudates Mouth:  MMM, tonsils non-erythematous, non-edematous.   Neck: No masses or thyromegaly or limitation in range of motion.  No cervical lymphadenopathy.        Assessment & Plan:

## 2011-02-20 ENCOUNTER — Telehealth: Payer: Self-pay | Admitting: *Deleted

## 2011-02-20 NOTE — Telephone Encounter (Signed)
In order for the appt to be made for pt (vertigo and jaw pain) their office will need to review his notes to make a determination if he should be seen. Notes faxed to [phone].Laureen Ochs, Viann Shove

## 2011-02-21 ENCOUNTER — Telehealth: Payer: Self-pay | Admitting: *Deleted

## 2011-02-21 NOTE — Telephone Encounter (Signed)
Called and informed pt of appt with [facility]NT with Dr. Jearld Fenton on 10.01.2012 @120  pm. Their office is located at [address] Told pt that if he cannot keep this appt he will need to call their office at 24 hours in advance at [phone] to cancel/reschedule or he may incur a fee. Pt agreed and understood.Laureen Ochs, Viann Shove

## 2011-03-15 ENCOUNTER — Other Ambulatory Visit: Payer: Self-pay | Admitting: Family Medicine

## 2011-03-15 NOTE — Telephone Encounter (Signed)
Refill request

## 2011-05-23 ENCOUNTER — Encounter: Payer: Self-pay | Admitting: Family Medicine

## 2011-05-23 ENCOUNTER — Ambulatory Visit (INDEPENDENT_AMBULATORY_CARE_PROVIDER_SITE_OTHER): Payer: 59 | Admitting: Family Medicine

## 2011-05-23 VITALS — BP 123/81 | HR 87 | Temp 98.2°F | Ht 69.0 in | Wt 215.0 lb

## 2011-05-23 DIAGNOSIS — R5381 Other malaise: Secondary | ICD-10-CM

## 2011-05-23 DIAGNOSIS — R5383 Other fatigue: Secondary | ICD-10-CM | POA: Insufficient documentation

## 2011-05-23 LAB — CBC
Hemoglobin: 15 g/dL (ref 13.0–17.0)
MCHC: 32.9 g/dL (ref 30.0–36.0)
Platelets: 318 10*3/uL (ref 150–400)
RBC: 5.14 MIL/uL (ref 4.22–5.81)

## 2011-05-23 LAB — TSH: TSH: 1.178 u[IU]/mL (ref 0.350–4.500)

## 2011-05-23 LAB — VITAMIN B12: Vitamin B-12: 322 pg/mL (ref 211–911)

## 2011-05-23 NOTE — Patient Instructions (Signed)

## 2011-05-23 NOTE — Assessment & Plan Note (Addendum)
Very broad differential for this including anemia, hypoTSH, low testosterone, medication, OSA, poor sleep hygiene, depression; especially given broad sxs profile. Will check CBC, TSH, testerone level. Will check b12 level given subjective paresthesias. Increased risk of b12 deficiency given ppi use. Discussed sleep study and the importance of this. Order replaced today. Instructed pt to follow up with PCP about this. Encouraged exercise. Will make no medication changes today. Mood stable. Discussed red flags for reevaluation.Pt has ENT follow up next week.  Instructed pt to follow up with PCP in 2 weeks.

## 2011-05-23 NOTE — Progress Notes (Signed)
  Subjective:   HPI Pt presents today with major complaint of generalized fatigue as well as multiple nonspecific complaints including sore throat, generalized malaise. Pt is noted to have multiple comorbidities including chronic pain, depression, allergic rhinitis,and reflux .   Pt states that fatigue has been present for the last 2-3 weeks, although he has had issues with chronic fatigue for several months. Pt states that he is recovering from a recent viral URI from a few weeks ago. He thinks this may be the nidus for his current sxs. Pt denies any recent changes in medication. Fatigue seems to be mainly in the day.  Pt states that he has had some issues with sleep, which have been a longstanding issue. Pt states that he also has a longstanding hx/o snoring. A sleep study was ordered in the past that pt never followed up on. No night sweats, or unintentional weight loss. No masses or swelling.  Pt states that he has had some sxs of sore throat over the past 1-2 weeks. Pt has been compliant with previous Rx for flonase. Pt states that this has helped tremendously with rhinorrhea and nasal congestion. Pt states that he has been intermittently compliant with his ppi. Pt denies any reflux sxs. Mood has been stable per pt. Pt is currently on celexa for mood. No HI/SI. Pt also reports of some nonspecific paresthesias in upper extremities.   Pt also states that he has f/u w/ ENT next week for vertigo and tonsils.    Review of Systems See HPI, otherwise ROS negative     Objective:   Physical Exam Gen: up in chair, NAD HEENT: NCAT, EOMI, TMs clear bilaterally; Mild nasal erythema bilaterally, mild post oropharyngeal erythema, no tonsillar hypertrophy.  CV: RRR, no murmurs auscultated PULM: CTAB, no wheezes, rales, rhoncii ABD: S/NT/+ bowel sounds  EXT: 2+ peripheral pulses Neuro: No focal motor or sensory deficits.  Assessment & Plan:

## 2011-05-24 LAB — TESTOSTERONE, FREE, TOTAL, SHBG
Sex Hormone Binding: 23 nmol/L (ref 13–71)
Testosterone, Free: 67.1 pg/mL (ref 47.0–244.0)

## 2011-05-25 ENCOUNTER — Telehealth: Payer: Self-pay | Admitting: Family Medicine

## 2011-05-25 MED ORDER — IBUPROFEN 800 MG PO TABS: 800.0000 mg | ORAL_TABLET | Freq: Three times a day (TID) | ORAL | Status: DC | PRN

## 2011-05-25 NOTE — Telephone Encounter (Signed)
[name] is calling for a refill on his Ibuprofen 800mg .  Please give him a call.

## 2011-05-25 NOTE — Telephone Encounter (Signed)
done

## 2011-05-31 ENCOUNTER — Encounter: Payer: Self-pay | Admitting: Family Medicine

## 2011-06-07 DIAGNOSIS — M542 Cervicalgia: Secondary | ICD-10-CM | POA: Diagnosis not present

## 2011-06-07 DIAGNOSIS — M5137 Other intervertebral disc degeneration, lumbosacral region: Secondary | ICD-10-CM | POA: Diagnosis not present

## 2011-06-07 DIAGNOSIS — M545 Low back pain: Secondary | ICD-10-CM | POA: Diagnosis not present

## 2011-06-12 ENCOUNTER — Other Ambulatory Visit: Payer: Self-pay | Admitting: Orthopaedic Surgery

## 2011-06-12 DIAGNOSIS — M545 Low back pain: Secondary | ICD-10-CM

## 2011-06-20 ENCOUNTER — Other Ambulatory Visit: Payer: Self-pay | Admitting: Orthopaedic Surgery

## 2011-06-20 DIAGNOSIS — IMO0001 Reserved for inherently not codable concepts without codable children: Secondary | ICD-10-CM

## 2011-06-27 ENCOUNTER — Ambulatory Visit (HOSPITAL_BASED_OUTPATIENT_CLINIC_OR_DEPARTMENT_OTHER): Payer: 59

## 2011-06-27 ENCOUNTER — Other Ambulatory Visit: Payer: Self-pay | Admitting: Family Medicine

## 2011-06-27 MED ORDER — OXYCODONE HCL 5 MG PO TABS: ORAL_TABLET | ORAL | Status: DC

## 2011-07-02 ENCOUNTER — Other Ambulatory Visit: Payer: 59

## 2011-07-02 ENCOUNTER — Inpatient Hospital Stay: Admission: RE | Admit: 2011-07-02 | Payer: 59 | Source: Ambulatory Visit

## 2011-07-09 ENCOUNTER — Other Ambulatory Visit: Payer: 59

## 2011-07-09 ENCOUNTER — Inpatient Hospital Stay: Admission: RE | Admit: 2011-07-09 | Payer: 59 | Source: Ambulatory Visit

## 2011-07-13 ENCOUNTER — Other Ambulatory Visit: Payer: Self-pay | Admitting: Family Medicine

## 2011-07-14 NOTE — Telephone Encounter (Signed)
Refill request

## 2011-07-16 NOTE — Telephone Encounter (Signed)
Refill request

## 2011-08-17 ENCOUNTER — Encounter: Payer: Self-pay | Admitting: Family Medicine

## 2011-08-17 ENCOUNTER — Ambulatory Visit (INDEPENDENT_AMBULATORY_CARE_PROVIDER_SITE_OTHER): Payer: 59 | Admitting: Family Medicine

## 2011-08-17 VITALS — BP 124/73 | HR 67 | Temp 98.3°F | Ht 69.0 in | Wt 218.0 lb

## 2011-08-17 DIAGNOSIS — F411 Generalized anxiety disorder: Secondary | ICD-10-CM

## 2011-08-17 DIAGNOSIS — J309 Allergic rhinitis, unspecified: Secondary | ICD-10-CM

## 2011-08-17 DIAGNOSIS — F419 Anxiety disorder, unspecified: Secondary | ICD-10-CM

## 2011-08-17 DIAGNOSIS — G500 Trigeminal neuralgia: Secondary | ICD-10-CM

## 2011-08-17 DIAGNOSIS — G508 Other disorders of trigeminal nerve: Secondary | ICD-10-CM | POA: Insufficient documentation

## 2011-08-17 MED ORDER — FLUTICASONE PROPIONATE 50 MCG/ACT NA SUSP: 2.0000 | Freq: Every day | NASAL | Status: DC

## 2011-08-17 NOTE — Assessment & Plan Note (Signed)
Patient has not been having panic attacks or bad anxiety. I advised him to continue his celexa. And take his ativan as needed. I do not think his anxiety is related to his neurologic symptoms on his right face.

## 2011-08-17 NOTE — Assessment & Plan Note (Signed)
Patient has nerve symptoms on right side of face: burning, tingling, right ear getting very red and pressure sensation.  He has history of allergic rhinitis and is taking flonase. History of TMJ (currently not having) No rash suggesting herpes zoster.   Has seen ENT last December - no findings associated.  Plan is to follow this up in a couple weeks or sooner if symptoms worsen.

## 2011-08-17 NOTE — Progress Notes (Signed)
  Subjective:    Patient ID: [name], male    DOB: Nov 12, 1970, 41 y.o.   MRN: [id number]  HPI 1. Right face tingling/burning/ear redness and pressure Patient has two week history of worsening right facial tingling/burning/right ear getting beet red and pressure. He has no rash. He has no infection in his nose/sinus/mouth/throat/neck. No neck stiffness or tenderness. No viral symptoms. Daughter who has virus. He has no head and neck surgery. He has no radiation to area. He has an MRI of the head 4/08 which was normal. He has seen ENT for similar symptoms without findings. He does have a history of TMJ, but not currently symptomatic/sign from this.   2. Allergic rhinitis Patient continues to take his flonase for sinus congestion.  He says he has minor congestion today, and exam was unremarkable.   3. Anxiety Patient has been taking celexa 20 mg with good effect.  He has not had any panic attacks. His right ear symptoms/face symptoms are not associated with anxiety.  He has taken his prn ativan without relief of facial symptoms.   Review of Systems Pertinent items are noted in HPI. No fever, chills, night sweats, weight loss. No arthropathy, no rash, no systemic symptoms. Weight gain.  No visual change, no nose bleeding, no syncope, no dizziness, no cough, no sore throat, no muscle pain.  Does have history of chicken pox.     Objective:   Physical Exam Filed Vitals:   08/17/11 1018  BP: 124/73  Pulse: 67  Temp: 98.3 F (36.8 C)  TempSrc: Oral  Height: 5\' 9"  (1.753 m)  Weight: 218 lb (98.884 kg)  Throat: normal mucosa, no exudate, uvula midline, no redness Nose:  External nasal examination shows no deformity or inflammation. Nasal mucosa are pink and moist without lesions or exudates. No septal dislocation or dislocation.No obstruction to airflow. Neck:  No deformities, thyromegaly, masses, or tenderness noted.   Supple with full range of motion without pain. Skin:   Intact without suspicious lesions or rashes Neurologic exam : Cn 2-7 intact Strength equal & normal in upper & lower extremities     Assessment & Plan:

## 2011-08-17 NOTE — Patient Instructions (Signed)
It was great to see you today!  Schedule an appointment to see Dr. Gwendolyn Grant as needed.  Im not sure what is causing your nerve pain, lets watch it for the next few weeks.

## 2011-09-10 ENCOUNTER — Ambulatory Visit
Admission: RE | Admit: 2011-09-10 | Discharge: 2011-09-10 | Disposition: A | Discharge status: Home / Self Care | Payer: 59 | Source: Ambulatory Visit | Attending: Orthopaedic Surgery | Admitting: Orthopaedic Surgery

## 2011-09-10 DIAGNOSIS — Z1389 Encounter for screening for other disorder: Secondary | ICD-10-CM | POA: Diagnosis not present

## 2011-09-10 DIAGNOSIS — IMO0001 Reserved for inherently not codable concepts without codable children: Secondary | ICD-10-CM

## 2011-09-10 DIAGNOSIS — M47817 Spondylosis without myelopathy or radiculopathy, lumbosacral region: Secondary | ICD-10-CM | POA: Diagnosis not present

## 2011-09-10 DIAGNOSIS — M5137 Other intervertebral disc degeneration, lumbosacral region: Secondary | ICD-10-CM | POA: Diagnosis not present

## 2011-09-10 DIAGNOSIS — M5126 Other intervertebral disc displacement, lumbar region: Secondary | ICD-10-CM | POA: Diagnosis not present

## 2011-09-10 DIAGNOSIS — M545 Low back pain: Secondary | ICD-10-CM

## 2011-09-17 DIAGNOSIS — M545 Low back pain: Secondary | ICD-10-CM | POA: Diagnosis not present

## 2011-09-17 DIAGNOSIS — M5137 Other intervertebral disc degeneration, lumbosacral region: Secondary | ICD-10-CM | POA: Diagnosis not present

## 2011-09-25 ENCOUNTER — Encounter: Payer: Self-pay | Admitting: Family Medicine

## 2011-09-25 ENCOUNTER — Ambulatory Visit (INDEPENDENT_AMBULATORY_CARE_PROVIDER_SITE_OTHER): Payer: 59 | Admitting: Family Medicine

## 2011-09-25 VITALS — BP 124/86 | HR 66 | Ht 69.0 in | Wt 221.0 lb

## 2011-09-25 DIAGNOSIS — M533 Sacrococcygeal disorders, not elsewhere classified: Secondary | ICD-10-CM | POA: Diagnosis not present

## 2011-09-25 DIAGNOSIS — M549 Dorsalgia, unspecified: Secondary | ICD-10-CM | POA: Diagnosis not present

## 2011-09-25 MED ORDER — KETOROLAC TROMETHAMINE 60 MG/2ML IM SOLN
60.0000 mg | Freq: Once | INTRAMUSCULAR | Status: AC
  Administered 2011-09-25: 60 mg via INTRAMUSCULAR

## 2011-09-25 NOTE — Patient Instructions (Signed)
Coccydynia (Tailbone Pain) (Also Called 'Tailbone Pain')  Coccydynia is pain in or around the area of the coccyx, also called the tailbone.  What causes coccydynia? Most often, the cause of coccydynia is unknown ("idiopathic"). Other causes include trauma (for example, from falls and childbirth); abnormal, excessive mobility of the tailbone; and - very rarely - infection, tumor, or fracture.   What are the symptoms of coccydynia? The classic symptom is pain when pressure is applied to the tailbone, such as when sitting on a hard chair. Symptoms usually improve with relief of pressure when standing or walking.  Other symptoms include:  Immediate and severe pain when moving from sitting to standing Pain during bowel movements Pain during sex Deep ache in the region of the tailbone How is coccydynia diagnosed? A thorough medical history and physical exam are essential. It is important to note any particular injury, whether recent or in the remote past. A history of prolonged labor or childbirth injury should be noted. A thorough inspection/palpation of this area is needed to detect any abnormal masses or abscesses (infections).  A lateral X-ray of the coccyx is taken to help detect any significant coccygeal pathology, such as a fracture.  Your health care provider might order more sophisticated tests such as CT scan, magnetic resonance imaging (MRI), or a bone scan of this area if this is clinically indicated.  How is coccydynia treated? Treatment most often is conservative and consists of non-steroidal anti-inflammatory drugs (NSAIDs) -- such as ibuprofen and naproxen -- to reduce inflammation, and the use of a therapeutic sitting cushion to take the pressure off of the tailbone when sitting. It might take many weeks or months of conservative treatment before significant pain relief is felt.  [facility] might refer you to a Midwife for measurement and  construction of a customized seating cushion. This specially designed seating cushion provides an "open area" in the seating surface that shifts the weight off of the tailbone to promote healing.  Your health care provider might consider physical therapy to treat coccydynia. This might include exercise to stretch the ligaments -- the tissue that connects bone to bone in a joint -- and strengthen the supporting muscles. Modalities such as heat, massage, and ultrasound might also be used.  Coccygeal manipulation is used to move the coccyx back into its proper position and alleviate pain.  Coccygectomy or surgery to remove the coccyx is only considered in rare instances, and only in very severe cases, when extensive conservative management does not control the pain of coccydynia. The main risks associated with surgery are infection and wound healing problems. There is a significant risk that the surgery will not result in pain relief.  Depression and anxiety, which might be present -- especially if the pain has been there for a long period of time -- should be aggressively treated.  A multidisciplinary chronic pain rehabilitation program might be offered in some instances.   Copyright [phone] [facility]. All rights reserved.  Can't find the health information you're looking for? Ask a Theatre manager, Live! Know someone who could use this information?...send them this link. This information is provided by the [facility] and is not intended to replace the medical advice of your doctor or health care provider. Please consult your health care provider for advice about a specific medical condition. This document was last reviewed on: 08/02/2008.Marland KitchenMarland Kitchen#[id number]
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
## 2011-09-25 NOTE — Assessment & Plan Note (Signed)
Toradol 60 IM x1. Scheduled NSAIDs and tylenol. Donut cushioning. Rest. Handout given. Follow up in 2-4 weeks with PCP if not improved.

## 2011-10-04 ENCOUNTER — Telehealth: Payer: Self-pay | Admitting: Family Medicine

## 2011-10-04 NOTE — Telephone Encounter (Signed)
Patient was in to see Dr. Alvester Morin and he needs a refill on desonide (DESOWEN) 0.05 % cream, to go to [facility] in [place].

## 2011-10-05 ENCOUNTER — Other Ambulatory Visit: Payer: Self-pay | Admitting: Family Medicine

## 2011-10-05 MED ORDER — DESONIDE 0.05 % EX CREA: TOPICAL_CREAM | Freq: Two times a day (BID) | CUTANEOUS | Status: DC

## 2011-10-05 NOTE — Telephone Encounter (Signed)
MEDICATION FILLED 

## 2011-10-17 ENCOUNTER — Other Ambulatory Visit: Payer: Self-pay | Admitting: Family Medicine

## 2011-11-14 DIAGNOSIS — H16439 Localized vascularization of cornea, unspecified eye: Secondary | ICD-10-CM | POA: Diagnosis not present

## 2011-11-15 DIAGNOSIS — M25579 Pain in unspecified ankle and joints of unspecified foot: Secondary | ICD-10-CM | POA: Diagnosis not present

## 2011-12-14 ENCOUNTER — Other Ambulatory Visit: Payer: Self-pay | Admitting: Family Medicine

## 2011-12-18 NOTE — Telephone Encounter (Signed)
Wife is calling because the [facility] has sent refill requests for Celexa but has not heard back on this refill.  The refill request is not in MD box, but this was a patient of Dr. Gwendolyn Grant previously and was last seen on 4/30 by Dr. Alvester Morin.  Wife said she would appreciate if this could be refilled today

## 2011-12-20 DIAGNOSIS — Z5181 Encounter for therapeutic drug level monitoring: Secondary | ICD-10-CM | POA: Diagnosis not present

## 2011-12-20 DIAGNOSIS — M545 Low back pain: Secondary | ICD-10-CM | POA: Diagnosis not present

## 2011-12-20 DIAGNOSIS — IMO0002 Reserved for concepts with insufficient information to code with codable children: Secondary | ICD-10-CM | POA: Diagnosis not present

## 2011-12-20 DIAGNOSIS — M5137 Other intervertebral disc degeneration, lumbosacral region: Secondary | ICD-10-CM | POA: Diagnosis not present

## 2012-01-01 ENCOUNTER — Encounter: Payer: 59 | Admitting: Family Medicine

## 2012-01-10 ENCOUNTER — Ambulatory Visit (INDEPENDENT_AMBULATORY_CARE_PROVIDER_SITE_OTHER): Payer: 59 | Admitting: Family Medicine

## 2012-01-10 ENCOUNTER — Encounter: Payer: Self-pay | Admitting: Family Medicine

## 2012-01-10 VITALS — BP 125/84 | HR 101 | Ht 69.0 in | Wt 223.0 lb

## 2012-01-10 DIAGNOSIS — Z0000 Encounter for general adult medical examination without abnormal findings: Secondary | ICD-10-CM

## 2012-01-10 DIAGNOSIS — E669 Obesity, unspecified: Secondary | ICD-10-CM | POA: Diagnosis not present

## 2012-01-10 DIAGNOSIS — F411 Generalized anxiety disorder: Secondary | ICD-10-CM | POA: Diagnosis not present

## 2012-01-10 MED ORDER — CITALOPRAM HYDROBROMIDE 20 MG PO TABS: 20.0000 mg | ORAL_TABLET | Freq: Every day | ORAL | Status: DC

## 2012-01-10 MED ORDER — FLUTICASONE PROPIONATE 50 MCG/ACT NA SUSP: 2.0000 | Freq: Every day | NASAL | Status: DC

## 2012-01-10 NOTE — Patient Instructions (Signed)
It was great to see you today! I would recommend that you start decreasing your Celexa by taking 1/2 pill per day for 1-2 weeks.  Then take 1/2 pill every other day for 1-2 weeks, then 1/2 pill every 3 days for 1-2 weeks then stop.  If you start having a lot of symptoms, go back to taking it daily. If you need to come back for labs, please schedule a lab appointment at the front desk on your way out. If we drew labs today, I will call you if any results are abnormal.  Otherwise you will get a letter in the mail.

## 2012-01-11 ENCOUNTER — Encounter: Payer: Self-pay | Admitting: Family Medicine

## 2012-01-11 NOTE — Progress Notes (Signed)
Patient ID: [name], male   DOB: August 07, 1970, 41 y.o.   MRN: [id number] Subjective: The patient is a 41 y.o. year old male who presents today for wellnes exam.  1. Anxiety: Has been on Celexa on and off for about a year for anxiety.  He is not currently having any problems with this and is wanting to try coming off.  He feels his mood is wel controlled currently.  Other than that, he does not have any concerns.  He needs basic lab work done for his [employer].  He has no new medical problems, no new allergies, and no hospitalizations.  Patient's past medical, social, and family history were reviewed and updated as appropriate. History  Substance Use Topics  . Smoking status: Former Games developer  . Smokeless tobacco: Never Used  . Alcohol Use: 0.5 oz/week    1 drink(s) per week     rarely, 3 per month   Objective:  Filed Vitals:   01/10/12 1608  BP: 125/84  Pulse: 101   Gen: NAD, overweight CV: RRR, no murmurs Resp: CTABL Ext: 2+ pulses, no edema, reflexes normal and symmetric  Assessment/Plan: Normal exam, well individual.  Will try taper off of celexa as detailed in patient instructions and obtain blood work.  Please also see individual problems in problem list for problem-specific plans.

## 2012-01-11 NOTE — Assessment & Plan Note (Signed)
Per patient request will taper off of celexa.  Instructed patient to return if symptoms return.

## 2012-01-27 DIAGNOSIS — I2699 Other pulmonary embolism without acute cor pulmonale: Secondary | ICD-10-CM

## 2012-01-27 HISTORY — DX: Other pulmonary embolism without acute cor pulmonale: I26.99

## 2012-01-30 ENCOUNTER — Ambulatory Visit: Payer: 59 | Admitting: Family Medicine

## 2012-01-30 ENCOUNTER — Other Ambulatory Visit: Payer: 59

## 2012-01-30 DIAGNOSIS — S81809A Unspecified open wound, unspecified lower leg, initial encounter: Secondary | ICD-10-CM | POA: Diagnosis not present

## 2012-01-30 DIAGNOSIS — S81009A Unspecified open wound, unspecified knee, initial encounter: Secondary | ICD-10-CM | POA: Diagnosis not present

## 2012-02-01 ENCOUNTER — Encounter: Payer: Self-pay | Admitting: Family Medicine

## 2012-02-01 ENCOUNTER — Telehealth: Payer: Self-pay | Admitting: Family Medicine

## 2012-02-01 ENCOUNTER — Ambulatory Visit: Payer: 59

## 2012-02-01 ENCOUNTER — Ambulatory Visit (INDEPENDENT_AMBULATORY_CARE_PROVIDER_SITE_OTHER): Payer: 59 | Admitting: Family Medicine

## 2012-02-01 VITALS — BP 141/80 | HR 106 | Temp 98.2°F | Ht 69.0 in | Wt 229.0 lb

## 2012-02-01 DIAGNOSIS — R05 Cough: Secondary | ICD-10-CM

## 2012-02-01 DIAGNOSIS — T148XXA Other injury of unspecified body region, initial encounter: Secondary | ICD-10-CM

## 2012-02-01 DIAGNOSIS — IMO0002 Reserved for concepts with insufficient information to code with codable children: Secondary | ICD-10-CM

## 2012-02-01 MED ORDER — CEPHALEXIN 500 MG PO CAPS: 500.0000 mg | ORAL_CAPSULE | Freq: Three times a day (TID) | ORAL | Status: DC
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
## 2012-02-01 NOTE — Telephone Encounter (Signed)
Wife states he has 8 staples in knee wound and 4 staples lower leg wound. They told ED doctor that he was on antibiotic  For sinus infection so she thinks this is why hs was not given antibiotic. However he only had two tabs left.  Also he has a deep congested cough . Appointment scheduled today to follow up need for antibiotic and cough.

## 2012-02-04 DIAGNOSIS — R05 Cough: Secondary | ICD-10-CM | POA: Insufficient documentation

## 2012-02-04 NOTE — Assessment & Plan Note (Signed)
Wounds clean/dry/intact.  Reassured pt that staples appear fine. I decided to extend his course of antibiotics: keflex 500mg  tid x 7d rx'd today. He'll return in 10-14 d for staples removal.

## 2012-02-04 NOTE — Assessment & Plan Note (Signed)
Reassured pt that no abnormalities were detected today on exam. Monitor.

## 2012-02-05 ENCOUNTER — Telehealth: Payer: Self-pay | Admitting: Family Medicine

## 2012-02-05 DIAGNOSIS — M25579 Pain in unspecified ankle and joints of unspecified foot: Secondary | ICD-10-CM

## 2012-02-05 DIAGNOSIS — M79673 Pain in unspecified foot: Secondary | ICD-10-CM

## 2012-02-05 NOTE — Telephone Encounter (Signed)
Pt is still having a lot of pain in foot and ankle.  Pt would like to have xray's done at [facility].  Aware it could be tomorrow before order is in and I will call him when order is in EPIC.

## 2012-02-05 NOTE — Telephone Encounter (Signed)
Right ankle x-ray ordered.  Tell him this will include his foot.-thx

## 2012-02-05 NOTE — Telephone Encounter (Signed)
Caller: [name]/Patient; Phone: [phone]; Reason for Call: Patient is returning call from Dr Milinda Cave regarding x-ray results.  Please call back.

## 2012-02-05 NOTE — Telephone Encounter (Signed)
LMOM today for pt to call back to discuss x-ray/leg pain situation.  The x-rays at [facility] included his right knee and tib/fib but did not include right ankle and foot.  If he is still hurting significantly in right ankle or foot then we'll order an x-ray of these areas.  If he's not hurting in these areas, then no further x-rays are needed.--thx

## 2012-02-06 NOTE — Telephone Encounter (Signed)
Pt notified.  Will go today for xray.

## 2012-02-08 ENCOUNTER — Other Ambulatory Visit: Payer: Self-pay | Admitting: Family Medicine

## 2012-02-08 ENCOUNTER — Ambulatory Visit (HOSPITAL_BASED_OUTPATIENT_CLINIC_OR_DEPARTMENT_OTHER)
Admission: RE | Admit: 2012-02-08 | Discharge: 2012-02-08 | Disposition: A | Discharge status: Home / Self Care | Payer: 59 | Source: Ambulatory Visit | Attending: Family Medicine | Admitting: Family Medicine

## 2012-02-08 DIAGNOSIS — M25579 Pain in unspecified ankle and joints of unspecified foot: Secondary | ICD-10-CM

## 2012-02-08 DIAGNOSIS — M79609 Pain in unspecified limb: Secondary | ICD-10-CM | POA: Diagnosis not present

## 2012-02-08 DIAGNOSIS — S8990XA Unspecified injury of unspecified lower leg, initial encounter: Secondary | ICD-10-CM | POA: Diagnosis not present

## 2012-02-08 DIAGNOSIS — M79671 Pain in right foot: Secondary | ICD-10-CM

## 2012-02-08 DIAGNOSIS — M7989 Other specified soft tissue disorders: Secondary | ICD-10-CM | POA: Diagnosis not present

## 2012-02-08 DIAGNOSIS — M79673 Pain in unspecified foot: Secondary | ICD-10-CM

## 2012-02-08 DIAGNOSIS — Z043 Encounter for examination and observation following other accident: Secondary | ICD-10-CM | POA: Diagnosis not present

## 2012-02-08 NOTE — Progress Notes (Signed)
Patient at xray suite adament that his foot and ankle be xrayed completely. Xray order for right foot complete is placed

## 2012-02-11 ENCOUNTER — Telehealth: Payer: Self-pay

## 2012-02-11 NOTE — Telephone Encounter (Signed)
I informed [name] per Dr Abner Greenspan that foot xray was normal. [name] voiced understanding but states it is "killing" him. [name] stated he sees Dr Milinda Cave tomorrow so he will discuss this with him .

## 2012-02-12 ENCOUNTER — Encounter: Payer: Self-pay | Admitting: Family Medicine

## 2012-02-12 ENCOUNTER — Telehealth: Payer: Self-pay | Admitting: *Deleted

## 2012-02-12 ENCOUNTER — Ambulatory Visit (INDEPENDENT_AMBULATORY_CARE_PROVIDER_SITE_OTHER): Payer: 59 | Admitting: Family Medicine

## 2012-02-12 VITALS — BP 114/81 | HR 84 | Ht 69.0 in | Wt 229.0 lb

## 2012-02-12 DIAGNOSIS — S46912A Strain of unspecified muscle, fascia and tendon at shoulder and upper arm level, left arm, initial encounter: Secondary | ICD-10-CM

## 2012-02-12 DIAGNOSIS — T148XXA Other injury of unspecified body region, initial encounter: Secondary | ICD-10-CM

## 2012-02-12 DIAGNOSIS — IMO0002 Reserved for concepts with insufficient information to code with codable children: Secondary | ICD-10-CM | POA: Diagnosis not present

## 2012-02-12 NOTE — Telephone Encounter (Signed)
Pt called hospital in [place] and they are going to request today's office note from you.  They will fax signed ROI from patient.  Pt states they want to see in your OV note today what wound looked like and what you had to do to close it back up.  Pt stated they would like you to be a specific as you can. Pt available if you need to call him.

## 2012-02-12 NOTE — Telephone Encounter (Signed)
OK.  Office note is done.

## 2012-02-12 NOTE — Assessment & Plan Note (Addendum)
Knee lac: 4 cm long; wound re-opened too far when staples were removed in office today, so I placed 5 interrupted sutures to re-close the wound.  I stressed the importance of wearing his knee immobilizer while this wound heals in order to minimize pulling/tension on the opposing wound edges. Wound care discussed.  Return in 10d for suture removal. Ankle lac ok, removed 4 staples from it today and it remains well approximated but I put steri-strips in place.  Wound care discussed.

## 2012-02-12 NOTE — Progress Notes (Addendum)
OFFICE NOTE  02/12/2012  CC:  Chief Complaint  Patient presents with  . Suture / Staple Removal    staples in right knee/calf; also having ankle pain, now having left shoulder pain     HPI: Patient is a 41 y.o. Caucasian male who is here for 11d f/u of right leg and ankle lacerations, needs staples removed.  Since I last saw him we obtained an x-ray of his right ankle and right foot due to ongoing pain and these were normal.  Lately says foot and ankle pain is getting a bit better.  Has not been using the crutches x 4d. Lately says left shoulder has been hurting a lot (4-5 days), esp with raising arm above shoulder level and when reaching out.  No weakness, no paresthesias, no neck pain.  Pertinent PMH:  Past Medical History  Diagnosis Date  . Hiatal hernia   . GERD (gastroesophageal reflux disease)   . IBS (irritable bowel syndrome)   . Diverticulosis   . Neck pain, chronic   . Back pain, chronic   . Anxiety   . Kidney stones 2005  . Erosive esophagitis     Grade A  . COLONIC POLYPS, HYPERPLASTIC 08/05/2007    Qualifier: Diagnosis of  By: Misty Stanley CMA (AAMA), Marchelle Folks    . ESOPHAGITIS, HX OF 12/15/2007    Qualifier: Diagnosis of  By: Burnadette Pop  MD, Trisha Mangle      MEDS:  Outpatient Prescriptions Prior to Visit  Medication Sig Dispense Refill  . citalopram (CELEXA) 20 MG tablet Take 1 tablet (20 mg total) by mouth daily.  30 tablet  0  . cyclobenzaprine (FLEXERIL) 10 MG tablet Take 10 mg by mouth at bedtime as needed. For muscle spasm       . fluticasone (FLONASE) 50 MCG/ACT nasal spray Place 2 sprays into the nose daily.  16 g  6  . ibuprofen (ADVIL,MOTRIN) 800 MG tablet Take 1 tablet (800 mg total) by mouth every 8 (eight) hours as needed for pain.  45 tablet  1  . omeprazole (PRILOSEC) 20 MG capsule Take 20 mg by mouth daily.      [name] oxyCODONE-acetaminophen (PERCOCET/ROXICET) 5-325 MG per tablet Take 1 tablet by mouth every 6 (six) hours as needed.      . cephALEXin (KEFLEX) 500  MG capsule Take 1 capsule (500 mg total) by mouth 3 (three) times daily.  21 capsule  0    PE: Blood pressure 114/81, pulse 84, height 5\' 9"  (1.753 m), weight 229 lb (103.874 kg). Gen: Alert, well appearing.  Patient is oriented to person, place, time, and situation. AFFECT: pleasant, lucid thought and speech. Left shoulder with limited abduction at 110-120 degrees, pain with ER and IR.  Negative drop sign. TTP along acromion. Right ankle wound c/d/i; staples removed (#4) and covered wound with steristrips and band-aid. Right knee wound c/d/i.  Staple removal in office today resulted in the wound dehiscing, with approx 3-29mm depth of subQ tissue exposed.  No drainage or bleeding.  The inferior portion of the wound appears to have been lifted and folded over the superior edge of the wound in order for the wound to be stapled.  Of note, this wound is essentially on top of the knee, where flexing of the knee naturally puts excess tension on the opposing wound edges.  PROCEDURE:  Knee laceration dehiscence repair: sterile procedure utilized, infiltrated wound with 5 cc of 2% lidocaine with epi for local anesthesia.  Placed 5 interrupted sutures (3-0  nylon) and the wound edges reapproximated well.  No deep/subQ suture required.  Pt tolerated procedure well.  No immediate complications.  IMPRESSION AND PLAN:  Laceration Knee lac: 4 cm long; wound re-opened too far when staples were removed in office today, so I placed 5 interrupted sutures to re-close the wound.  I stressed the importance of wearing his knee immobilizer while this wound heals in order to minimize pulling/tension on the opposing wound edges. Wound care discussed.  Return in 10d for suture removal. Ankle lac ok, removed 4 staples from it today and it remains well approximated but I put steri-strips in place.  Wound care discussed.  Left shoulder strain Discussed dx, ROM exercises, prn NSAIDs.   If sx's persist I recommend PT or trial  of shoulder steroid injection.     FOLLOW UP: 10d for suture removal.

## 2012-02-12 NOTE — Assessment & Plan Note (Signed)
Discussed dx, ROM exercises, prn NSAIDs.   If sx's persist I recommend PT or trial of shoulder steroid injection.

## 2012-02-13 ENCOUNTER — Other Ambulatory Visit: Payer: Self-pay | Admitting: Family Medicine

## 2012-02-14 ENCOUNTER — Emergency Department (HOSPITAL_BASED_OUTPATIENT_CLINIC_OR_DEPARTMENT_OTHER): Payer: 59

## 2012-02-14 ENCOUNTER — Emergency Department (HOSPITAL_BASED_OUTPATIENT_CLINIC_OR_DEPARTMENT_OTHER)
Admission: EM | Admit: 2012-02-14 | Discharge: 2012-02-14 | Disposition: A | Discharge status: Home / Self Care | Payer: 59 | Source: Home / Self Care | Attending: Emergency Medicine | Admitting: Emergency Medicine

## 2012-02-14 ENCOUNTER — Encounter (HOSPITAL_COMMUNITY): Payer: Self-pay | Admitting: *Deleted

## 2012-02-14 ENCOUNTER — Encounter (HOSPITAL_BASED_OUTPATIENT_CLINIC_OR_DEPARTMENT_OTHER): Payer: Self-pay

## 2012-02-14 ENCOUNTER — Ambulatory Visit: Payer: 59 | Admitting: Family Medicine

## 2012-02-14 ENCOUNTER — Inpatient Hospital Stay (HOSPITAL_COMMUNITY)
Admission: EM | Admit: 2012-02-14 | Discharge: 2012-02-20 | DRG: 864 | Disposition: A | Discharge status: Home / Self Care | Payer: 59 | Attending: Internal Medicine | Admitting: Internal Medicine

## 2012-02-14 DIAGNOSIS — S7010XA Contusion of unspecified thigh, initial encounter: Secondary | ICD-10-CM | POA: Diagnosis present

## 2012-02-14 DIAGNOSIS — R05 Cough: Secondary | ICD-10-CM | POA: Diagnosis not present

## 2012-02-14 DIAGNOSIS — R51 Headache: Secondary | ICD-10-CM

## 2012-02-14 DIAGNOSIS — K589 Irritable bowel syndrome without diarrhea: Secondary | ICD-10-CM

## 2012-02-14 DIAGNOSIS — A419 Sepsis, unspecified organism: Secondary | ICD-10-CM

## 2012-02-14 DIAGNOSIS — S46912A Strain of unspecified muscle, fascia and tendon at shoulder and upper arm level, left arm, initial encounter: Secondary | ICD-10-CM

## 2012-02-14 DIAGNOSIS — R509 Fever, unspecified: Principal | ICD-10-CM

## 2012-02-14 DIAGNOSIS — X58XXXA Exposure to other specified factors, initial encounter: Secondary | ICD-10-CM | POA: Diagnosis present

## 2012-02-14 DIAGNOSIS — L02419 Cutaneous abscess of limb, unspecified: Secondary | ICD-10-CM

## 2012-02-14 DIAGNOSIS — K449 Diaphragmatic hernia without obstruction or gangrene: Secondary | ICD-10-CM

## 2012-02-14 DIAGNOSIS — E669 Obesity, unspecified: Secondary | ICD-10-CM

## 2012-02-14 DIAGNOSIS — R197 Diarrhea, unspecified: Secondary | ICD-10-CM | POA: Diagnosis not present

## 2012-02-14 DIAGNOSIS — F411 Generalized anxiety disorder: Secondary | ICD-10-CM

## 2012-02-14 DIAGNOSIS — K5730 Diverticulosis of large intestine without perforation or abscess without bleeding: Secondary | ICD-10-CM

## 2012-02-14 DIAGNOSIS — M5412 Radiculopathy, cervical region: Secondary | ICD-10-CM

## 2012-02-14 DIAGNOSIS — M1990 Unspecified osteoarthritis, unspecified site: Secondary | ICD-10-CM

## 2012-02-14 DIAGNOSIS — R059 Cough, unspecified: Secondary | ICD-10-CM

## 2012-02-14 DIAGNOSIS — IMO0002 Reserved for concepts with insufficient information to code with codable children: Secondary | ICD-10-CM

## 2012-02-14 DIAGNOSIS — M549 Dorsalgia, unspecified: Secondary | ICD-10-CM | POA: Diagnosis present

## 2012-02-14 DIAGNOSIS — G8929 Other chronic pain: Secondary | ICD-10-CM

## 2012-02-14 DIAGNOSIS — K219 Gastro-esophageal reflux disease without esophagitis: Secondary | ICD-10-CM

## 2012-02-14 DIAGNOSIS — R5381 Other malaise: Secondary | ICD-10-CM | POA: Diagnosis not present

## 2012-02-14 LAB — CBC WITH DIFFERENTIAL/PLATELET
Basophils Absolute: 0 10*3/uL (ref 0.0–0.1)
Eosinophils Absolute: 0 10*3/uL (ref 0.0–0.7)
Eosinophils Relative: 0 % (ref 0–5)
HCT: 40.6 % (ref 39.0–52.0)
Lymphocytes Relative: 14 % (ref 12–46)
MCH: 29.8 pg (ref 26.0–34.0)
MCHC: 34.2 g/dL (ref 30.0–36.0)
MCV: 87.1 fL (ref 78.0–100.0)
Monocytes Absolute: 0.6 10*3/uL (ref 0.1–1.0)
Platelets: 227 10*3/uL (ref 150–400)
RDW: 12.9 % (ref 11.5–15.5)
WBC: 7.7 10*3/uL (ref 4.0–10.5)

## 2012-02-14 LAB — BASIC METABOLIC PANEL
CO2: 23 mEq/L (ref 19–32)
Calcium: 9.2 mg/dL (ref 8.4–10.5)
Creatinine, Ser: 0.9 mg/dL (ref 0.50–1.35)
GFR calc non Af Amer: >90 mL/min (ref >90)
Sodium: 136 mEq/L (ref 135–145)

## 2012-02-14 MED ORDER — SODIUM CHLORIDE 0.9 % IV SOLN: 1000.0000 mL | INTRAVENOUS | Status: DC

## 2012-02-14 MED ORDER — SODIUM CHLORIDE 0.9 % IV SOLN
1000.0000 mL | Freq: Once | INTRAVENOUS | Status: AC
  Administered 2012-02-14: 1000 mL via INTRAVENOUS

## 2012-02-14 MED ORDER — IBUPROFEN 800 MG PO TABS
800.0000 mg | ORAL_TABLET | Freq: Once | ORAL | Status: AC
  Administered 2012-02-14: 800 mg via ORAL
  Filled 2012-02-14: qty 1

## 2012-02-14 MED ORDER — ONDANSETRON HCL 4 MG/2ML IJ SOLN
4.0000 mg | Freq: Once | INTRAMUSCULAR | Status: AC
  Administered 2012-02-14: 4 mg via INTRAVENOUS
  Filled 2012-02-14: qty 2

## 2012-02-14 MED ORDER — SODIUM CHLORIDE 0.9 % IV SOLN: 1000.0000 mL | Freq: Once | INTRAVENOUS | Status: DC

## 2012-02-14 MED ORDER — ACETAMINOPHEN 325 MG PO TABS
650.0000 mg | ORAL_TABLET | Freq: Once | ORAL | Status: AC
  Administered 2012-02-14: 650 mg via ORAL
  Filled 2012-02-14: qty 2

## 2012-02-14 MED ORDER — SODIUM CHLORIDE 0.9 % IV BOLUS (SEPSIS)
1000.0000 mL | Freq: Once | INTRAVENOUS | Status: AC
  Administered 2012-02-14: 1000 mL via INTRAVENOUS

## 2012-02-14 MED ORDER — HYDROMORPHONE HCL PF 1 MG/ML IJ SOLN
1.0000 mg | Freq: Once | INTRAMUSCULAR | Status: AC
  Administered 2012-02-14: 1 mg via INTRAVENOUS
  Filled 2012-02-14: qty 1

## 2012-02-14 MED ORDER — SODIUM CHLORIDE 0.9 % IV SOLN
1000.0000 mL | Freq: Once | INTRAVENOUS | Status: AC
  Administered 2012-02-14 (×2): 1000 mL via INTRAVENOUS
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
## 2012-02-14 NOTE — ED Provider Notes (Signed)
History     CSN: [id number]  Arrival date & time 02/14/12  1950   First MD Initiated Contact with Patient 02/14/12 2134      Chief Complaint  Patient presents with  . Fever     Patient is a 41 y.o. male presenting with fever. The history is provided by the patient.  Fever Primary symptoms of the febrile illness include fever, fatigue, headaches, cough, nausea and diarrhea. Primary symptoms do not include shortness of breath, abdominal pain, vomiting, altered mental status or rash. The current episode started 2 days ago. This is a new problem. The problem has been gradually worsening.  pt presents for fever, headache for past 2 days He reports diarrhea for 2 days (nonbloody) He reports mild cough No tick bites No rash He reports his headache is severe No visual changes No focal weakness No recent travel  Past Medical History  Diagnosis Date  . Hiatal hernia   . GERD (gastroesophageal reflux disease)   . IBS (irritable bowel syndrome)   . Diverticulosis   . Neck pain, chronic   . Back pain, chronic   . Anxiety   . Kidney stones 2005  . Erosive esophagitis     Grade A  . COLONIC POLYPS, HYPERPLASTIC 08/05/2007    Qualifier: Diagnosis of  By: Misty Stanley CMA (AAMA), Marchelle Folks    . ESOPHAGITIS, HX OF 12/15/2007    Qualifier: Diagnosis of  By: Burnadette Pop  MD, Trisha Mangle      Past Surgical History  Procedure Date  . Kidney stone surgery     removal  . Mandible surgery     cyst removal  . Upper gastrointestinal endoscopy   . Colonoscopy   . Polypectomy     Family History  Problem Relation Age of Onset  . Diabetes Mother   . Colon cancer Neg Hx   . Diabetes Maternal Grandfather     Insulin dependent  . Heart attack Maternal Grandmother     History  Substance Use Topics  . Smoking status: Former Games developer  . Smokeless tobacco: Never Used  . Alcohol Use: 0.5 oz/week    1 drink(s) per week     rarely, 3 per month      Review of Systems  Constitutional: Positive for  fever and fatigue.  Respiratory: Positive for cough. Negative for shortness of breath.   Gastrointestinal: Positive for nausea and diarrhea. Negative for vomiting and abdominal pain.  Skin: Negative for rash.  Neurological: Positive for headaches.  Psychiatric/Behavioral: Negative for altered mental status.  All other systems reviewed and are negative.    Allergies  Clarithromycin and Gabapentin  Home Medications   Current Outpatient Rx  Name Route Sig Dispense Refill  . ACETAMINOPHEN 500 MG PO TABS Oral Take 1,000 mg by mouth every 6 (six) hours as needed. For fever    . CITALOPRAM HYDROBROMIDE 20 MG PO TABS  TAKE 1 TABLET BY MOUTH EVERY DAY 30 tablet 3  . CYCLOBENZAPRINE HCL 10 MG PO TABS Oral Take 10 mg by mouth at bedtime as needed. For muscle spasm     . IBUPROFEN 800 MG PO TABS Oral Take 1 tablet (800 mg total) by mouth every 8 (eight) hours as needed for pain. 45 tablet 1  . OMEPRAZOLE 20 MG PO CPDR Oral Take 20 mg by mouth daily.    . OXYCODONE-ACETAMINOPHEN 5-325 MG PO TABS Oral Take 1 tablet by mouth every 6 (six) hours as needed.    [name] FLUTICASONE PROPIONATE 50 MCG/ACT NA  SUSP Nasal Place 2 sprays into the nose daily. 16 g 6    BP 133/79  Pulse 112  Temp 102.2 F (39 C) (Oral)  Resp 18  SpO2 97%  Physical Exam CONSTITUTIONAL: Well developed/well nourished HEAD AND FACE: Normocephalic/atraumatic EYES: EOMI/PERRL ENMT: Mucous membranes moist, uvula midline, pharynx normal NECK: supple no meningeal signs SPINE:entire spine nontender CV: S1/S2 noted, no murmurs/rubs/gallops noted LUNGS: Lungs are clear to auscultation bilaterally, no apparent distress ABDOMEN: soft, nontender, no rebound or guarding GU:no cva tenderness NEURO: Pt is awake/alert, moves all extremitiesx4, no arm/leg drift is noted, GCS 15 EXTREMITIES: pulses normal, full ROM SKIN: warm, color normal, no rash.  Well healed wounds noted to right lower extremities (h/o laceration to that leg) PSYCH: no  abnormalities of mood noted  ED Course  LUMBAR PUNCTURE Date/Time: 02/15/2012 12:54 AM Performed by: Joya Gaskins Authorized by: Joya Gaskins Consent: Verbal consent obtained. Written consent obtained. Risks and benefits: risks, benefits and alternatives were discussed Consent given by: patient Patient identity confirmed: verbally with patient and arm band Time out: Immediately prior to procedure a "time out" was called to verify the correct patient, procedure, equipment, support staff and site/side marked as required. Indications: evaluation for infection Anesthesia: local infiltration Local anesthetic: lidocaine 1% without epinephrine Anesthetic total: 3 ml Patient sedated: no Preparation: Patient was prepped and draped in the usual sterile fashion. Lumbar space: L3-L4 interspace Patient's position: sitting Needle gauge: 18 Number of attempts: 4 Fluid appearance: clear Tubes of fluid: 4 Total volume: 8 ml Post-procedure: site cleaned and adhesive bandage applied Patient tolerance: Patient tolerated the procedure well with no immediate complications. Comments: Multiple attempts need to poor positioning of patient and poor patient tolerance (he didn't want pain meds during procedure)) Pt neurovascularly intact after procedure        Labs Reviewed  CSF CELL COUNT WITH DIFFERENTIAL  CSF CELL COUNT WITH DIFFERENTIAL  CSF CULTURE  PROTEIN AND GLUCOSE, CSF  CSF CULTURE  GRAM STAIN   Dg Chest 2 View  02/14/2012  *RADIOLOGY REPORT*  Clinical Data: Cough, fever, flu-like symptoms  CHEST - 2 VIEW  Comparison: 01/24/2011  Findings: Cardiomediastinal silhouette is stable.  No acute infiltrate or pleural effusion.  No pulmonary edema.  Bony thorax is stable.  IMPRESSION: No active disease.  No significant change.   Original Report Authenticated By: Natasha Mead, M.D.    10:01 PM Pt already seen earlier in ED for fever, headache.  Had negative CXR And bmp/cbc Pt reports  severe headache and fever Advised lumbar puncture to r/o meningitis He has no focal neuro symptoms, defer neuro imaging or antibiotics prior to LP  12:55 AM LP completed Pt stable Awaiting results D/w dr Arnoldo Morale to f/u on CSF results Pt stable at this time   MDM  Nursing notes including past medical history and social history reviewed and considered in documentation Labs/vital reviewed and considered Previous records reviewed and considered - previous ED visit reviewed         Joya Gaskins, MD 02/15/12 [phone]

## 2012-02-14 NOTE — ED Notes (Signed)
C/o cold chills, body aches, nausea, HA x 4 days-recent abx for leg injury/sutues
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
## 2012-02-14 NOTE — ED Notes (Signed)
EDP advised pt to be d/c with 2 L NS infused

## 2012-02-15 ENCOUNTER — Telehealth: Payer: Self-pay | Admitting: *Deleted

## 2012-02-15 ENCOUNTER — Emergency Department (HOSPITAL_COMMUNITY): Payer: 59

## 2012-02-15 DIAGNOSIS — G8929 Other chronic pain: Secondary | ICD-10-CM

## 2012-02-15 DIAGNOSIS — R509 Fever, unspecified: Secondary | ICD-10-CM | POA: Diagnosis not present

## 2012-02-15 DIAGNOSIS — R51 Headache: Secondary | ICD-10-CM

## 2012-02-15 DIAGNOSIS — M549 Dorsalgia, unspecified: Secondary | ICD-10-CM | POA: Diagnosis not present

## 2012-02-15 DIAGNOSIS — R197 Diarrhea, unspecified: Secondary | ICD-10-CM

## 2012-02-15 LAB — CBC
HCT: 36.6 % — ABNORMAL LOW (ref 39.0–52.0)
MCH: 29.4 pg (ref 26.0–34.0)
MCV: 89.1 fL (ref 78.0–100.0)
Platelets: 179 10*3/uL (ref 150–400)
RBC: 4.11 MIL/uL — ABNORMAL LOW (ref 4.22–5.81)

## 2012-02-15 LAB — CSF CELL COUNT WITH DIFFERENTIAL
Eosinophils, CSF: 0 % (ref 0–1)
Eosinophils, CSF: 0 % (ref 0–1)
RBC Count, CSF: 0 /mm3
Tube #: 4
WBC, CSF: 1 /mm3 (ref 0–5)
WBC, CSF: 1 /mm3 (ref 0–5)

## 2012-02-15 LAB — URINE MICROSCOPIC-ADD ON

## 2012-02-15 LAB — PROTEIN AND GLUCOSE, CSF: Glucose, CSF: 62 mg/dL (ref 43–76)

## 2012-02-15 LAB — URINALYSIS, ROUTINE W REFLEX MICROSCOPIC
Bilirubin Urine: NEGATIVE
Ketones, ur: NEGATIVE mg/dL
Nitrite: NEGATIVE
Protein, ur: NEGATIVE mg/dL
Urobilinogen, UA: 0.2 mg/dL (ref 0.0–1.0)

## 2012-02-15 LAB — GRAM STAIN

## 2012-02-15 LAB — HEMOGLOBIN A1C: Mean Plasma Glucose: 114 mg/dL (ref <117)

## 2012-02-15 LAB — INFLUENZA PANEL BY PCR (TYPE A & B)
H1N1 flu by pcr: NOT DETECTED
Influenza B By PCR: NEGATIVE

## 2012-02-15 LAB — HEPATIC FUNCTION PANEL
ALT: 24 U/L (ref 0–53)
Bilirubin, Direct: <0.1 mg/dL (ref 0.0–0.3)

## 2012-02-15 MED ORDER — IBUPROFEN 800 MG PO TABS
800.0000 mg | ORAL_TABLET | Freq: Once | ORAL | Status: AC
  Administered 2012-02-15: 800 mg via ORAL
  Filled 2012-02-15: qty 1

## 2012-02-15 MED ORDER — CYCLOBENZAPRINE HCL 10 MG PO TABS
10.0000 mg | ORAL_TABLET | Freq: Every evening | ORAL | Status: DC | PRN
  Administered 2012-02-15 – 2012-02-17 (×3): 10 mg via ORAL
  Filled 2012-02-15 (×3): qty 1

## 2012-02-15 MED ORDER — ENOXAPARIN SODIUM 40 MG/0.4ML ~~LOC~~ SOLN
40.0000 mg | SUBCUTANEOUS | Status: DC
  Administered 2012-02-15 – 2012-02-18 (×4): 40 mg via SUBCUTANEOUS
  Filled 2012-02-15 (×4): qty 0.4

## 2012-02-15 MED ORDER — DEXTROSE 5 % IV SOLN
100.0000 mg | Freq: Once | INTRAVENOUS | Status: DC
  Administered 2012-02-15: 100 mg via INTRAVENOUS
  Filled 2012-02-15: qty 100

## 2012-02-15 MED ORDER — ONDANSETRON HCL 4 MG PO TABS: 4.0000 mg | ORAL_TABLET | Freq: Four times a day (QID) | ORAL | Status: DC | PRN

## 2012-02-15 MED ORDER — OXYCODONE-ACETAMINOPHEN 5-325 MG PO TABS
1.0000 | ORAL_TABLET | Freq: Four times a day (QID) | ORAL | Status: DC | PRN
  Administered 2012-02-15 – 2012-02-16 (×3): 1 via ORAL
  Filled 2012-02-15 (×3): qty 1

## 2012-02-15 MED ORDER — PIPERACILLIN-TAZOBACTAM 3.375 G IVPB
3.3750 g | Freq: Three times a day (TID) | INTRAVENOUS | Status: DC
  Administered 2012-02-15 – 2012-02-16 (×3): 3.375 g via INTRAVENOUS
  Filled 2012-02-15 (×4): qty 50

## 2012-02-15 MED ORDER — ACETAMINOPHEN 500 MG PO TABS
1000.0000 mg | ORAL_TABLET | Freq: Four times a day (QID) | ORAL | Status: DC | PRN
  Filled 2012-02-15 (×2): qty 2

## 2012-02-15 MED ORDER — CITALOPRAM HYDROBROMIDE 20 MG PO TABS
20.0000 mg | ORAL_TABLET | Freq: Every day | ORAL | Status: DC
  Administered 2012-02-15 – 2012-02-20 (×6): 20 mg via ORAL
  Filled 2012-02-15 (×6): qty 1

## 2012-02-15 MED ORDER — SODIUM CHLORIDE 0.9 % IV SOLN: INTRAVENOUS | Status: DC

## 2012-02-15 MED ORDER — SODIUM CHLORIDE 0.9 % IV BOLUS (SEPSIS)
1000.0000 mL | Freq: Once | INTRAVENOUS | Status: AC
  Administered 2012-02-15: 1000 mL via INTRAVENOUS

## 2012-02-15 MED ORDER — VANCOMYCIN HCL 1000 MG IV SOLR
2000.0000 mg | Freq: Once | INTRAVENOUS | Status: AC
  Administered 2012-02-15: 2000 mg via INTRAVENOUS
  Filled 2012-02-15: qty 2000

## 2012-02-15 MED ORDER — DIPHENHYDRAMINE HCL 25 MG PO CAPS
25.0000 mg | ORAL_CAPSULE | Freq: Once | ORAL | Status: AC
  Administered 2012-02-15: 25 mg via ORAL
  Filled 2012-02-15: qty 1

## 2012-02-15 MED ORDER — METRONIDAZOLE 500 MG PO TABS
500.0000 mg | ORAL_TABLET | Freq: Three times a day (TID) | ORAL | Status: DC
  Administered 2012-02-15: 500 mg via ORAL
  Filled 2012-02-15 (×3): qty 1

## 2012-02-15 MED ORDER — SODIUM CHLORIDE 0.9 % IV SOLN
INTRAVENOUS | Status: DC
  Administered 2012-02-15 – 2012-02-16 (×2): via INTRAVENOUS
  Administered 2012-02-16: 100 mL via INTRAVENOUS
  Administered 2012-02-17: 04:00:00 via INTRAVENOUS
  Administered 2012-02-17: 100 mL via INTRAVENOUS
  Administered 2012-02-18: 05:00:00 via INTRAVENOUS

## 2012-02-15 MED ORDER — ONDANSETRON HCL 4 MG/2ML IJ SOLN
4.0000 mg | Freq: Four times a day (QID) | INTRAMUSCULAR | Status: DC | PRN
  Administered 2012-02-15 – 2012-02-18 (×5): 4 mg via INTRAVENOUS
  Filled 2012-02-15 (×5): qty 2

## 2012-02-15 MED ORDER — ONDANSETRON HCL 4 MG/2ML IJ SOLN
4.0000 mg | Freq: Once | INTRAMUSCULAR | Status: AC
  Administered 2012-02-15: 4 mg via INTRAVENOUS
  Filled 2012-02-15: qty 2

## 2012-02-15 MED ORDER — VANCOMYCIN HCL IN DEXTROSE 1-5 GM/200ML-% IV SOLN
1000.0000 mg | Freq: Two times a day (BID) | INTRAVENOUS | Status: DC
  Administered 2012-02-16 – 2012-02-17 (×4): 1000 mg via INTRAVENOUS
  Filled 2012-02-15 (×5): qty 200

## 2012-02-15 MED ORDER — SACCHAROMYCES BOULARDII 250 MG PO CAPS
250.0000 mg | ORAL_CAPSULE | Freq: Two times a day (BID) | ORAL | Status: DC
  Administered 2012-02-15 – 2012-02-20 (×10): 250 mg via ORAL
  Filled 2012-02-15 (×11): qty 1

## 2012-02-15 MED ORDER — FLUTICASONE PROPIONATE 50 MCG/ACT NA SUSP
2.0000 | Freq: Every day | NASAL | Status: DC
  Administered 2012-02-15 – 2012-02-20 (×6): 2 via NASAL
  Filled 2012-02-15: qty 16

## 2012-02-15 MED ORDER — ONDANSETRON 4 MG PO TBDP
4.0000 mg | ORAL_TABLET | Freq: Once | ORAL | Status: AC
  Administered 2012-02-15: 4 mg via ORAL
  Filled 2012-02-15: qty 1

## 2012-02-15 MED ORDER — PANTOPRAZOLE SODIUM 40 MG PO TBEC
40.0000 mg | DELAYED_RELEASE_TABLET | Freq: Every day | ORAL | Status: DC
  Administered 2012-02-15 – 2012-02-19 (×5): 40 mg via ORAL
  Filled 2012-02-15 (×5): qty 1

## 2012-02-15 MED ORDER — OXYCODONE-ACETAMINOPHEN 5-325 MG PO TABS
2.0000 | ORAL_TABLET | Freq: Once | ORAL | Status: AC
  Administered 2012-02-15: 2 via ORAL
  Filled 2012-02-15: qty 2

## 2012-02-15 MED ORDER — IBUPROFEN 800 MG PO TABS
800.0000 mg | ORAL_TABLET | Freq: Three times a day (TID) | ORAL | Status: DC | PRN
  Administered 2012-02-15 – 2012-02-16 (×2): 800 mg via ORAL
  Filled 2012-02-15 (×2): qty 1

## 2012-02-15 NOTE — ED Notes (Signed)
No needs at this time.

## 2012-02-15 NOTE — Telephone Encounter (Signed)
Noted-PM 

## 2012-02-15 NOTE — ED Notes (Signed)
Family at bedside. 

## 2012-02-15 NOTE — ED Notes (Signed)
Urine sent to lab, md was made aware of fever and ibuprofen was ordered and given.

## 2012-02-15 NOTE — Progress Notes (Signed)
   Continues to have low-grade fever and chills.  Lab work  Reviewed: Negative C. Difficile PCR, RMSF titer, flu swab and clear urine.  Patient is on empiric Flagyl, I will discontinue and start on Zosyn and vancomycin.  If patient continues to have fever on broad-spectrum antibiotics I will call infectious disease.  Clint Lipps Pager: [phone] 02/15/2012, 6:30 PM
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
## 2012-02-15 NOTE — ED Notes (Signed)
Pt ambulated to restroom, complains of headache and nausea. Fever now 101.8 md made aware.

## 2012-02-15 NOTE — Telephone Encounter (Signed)
Pt called after hours c/o fever, HA, chills and nausea.  Per note pt has been taking to ER before nurse could return call.  Pt currently admitted.

## 2012-02-16 DIAGNOSIS — R51 Headache: Secondary | ICD-10-CM

## 2012-02-16 DIAGNOSIS — R509 Fever, unspecified: Secondary | ICD-10-CM | POA: Diagnosis not present

## 2012-02-16 LAB — CBC
HCT: 35.5 % — ABNORMAL LOW (ref 39.0–52.0)
Hemoglobin: 12.2 g/dL — ABNORMAL LOW (ref 13.0–17.0)
MCH: 30.6 pg (ref 26.0–34.0)
MCHC: 34.4 g/dL (ref 30.0–36.0)
RBC: 3.99 MIL/uL — ABNORMAL LOW (ref 4.22–5.81)

## 2012-02-16 LAB — BASIC METABOLIC PANEL
BUN: 9 mg/dL (ref 6–23)
Chloride: 108 mEq/L (ref 96–112)
GFR calc non Af Amer: 88 mL/min — ABNORMAL LOW (ref >90)
Glucose, Bld: 92 mg/dL (ref 70–99)
Potassium: 3.8 mEq/L (ref 3.5–5.1)
Sodium: 141 mEq/L (ref 135–145)

## 2012-02-16 LAB — URINE CULTURE
Colony Count: NO GROWTH
Culture: NO GROWTH

## 2012-02-16 MED ORDER — OXYCODONE-ACETAMINOPHEN 5-325 MG PO TABS
2.0000 | ORAL_TABLET | Freq: Four times a day (QID) | ORAL | Status: DC | PRN
Start: 2012-02-16 — End: 2012-02-20
  Administered 2012-02-16 – 2012-02-17 (×5): 2 via ORAL
  Administered 2012-02-18: 1 via ORAL
  Administered 2012-02-19 (×3): 2 via ORAL
  Filled 2012-02-16 (×5): qty 2
  Filled 2012-02-16: qty 1
  Filled 2012-02-16: qty 2
  Filled 2012-02-16: qty 1
  Filled 2012-02-16 (×2): qty 2

## 2012-02-16 NOTE — Progress Notes (Signed)
[facility] PROGRESS NOTE  [name] ZOX:[id number] DOB: 07-25-1970 DOA: 02/14/2012 PCP: [name], MD  Assessment/Plan: Principal Problem:  *Fever Active Problems:  Diarrhea  Headache  Chronic back pain   Fever  -Unclear origin for now, blood, urine, stool studies sent. -Negative C. Difficile PCR, RMSF titer, flu swab and clear urine. -Blood culture still NGTD, patient started on Zosyn and vancomycin empirically. -I asked infectious disease to evaluate him.  Headache  -With the fever there was concern about meningitis, patient does not have signs of meningismus.  -Lumbar puncture was done, and CSF came back clear.  -Still complaining about headache, worse when he sits up, I wonder now if it has post LP headache component  Diarrhea  -Patient has recent extensive antibiotics use, total of 3 courses of antibiotics over one month.  -Has 10-12 watery bowel movements per day, associated with flatulence and cramps.  -Does not have leukocytosis but has a relative neutrophilia.  -He started empirically on Flagyl to cover for C. difficile colitis and C. difficile PCR came back negative. -Stool is positive for lactoferrin.  Chronic back and neck pain  -Continue when necessary narcotics and when necessary muscle relaxants.   Code Status: Full Family Communication: Wife at bedside Disposition Plan: Remains as inpatient   Brief narrative: 41 year old Caucasian male with past medical history of chronic back pain, GERD and anxiety came into the hospital complaining about headache and fever of 102.7  Consultants:  Johny Sax infectious disease to see.  Procedures:  LP  Antibiotics:  Flagyl started on 02/15/2012 discontinued and the same day.  Zosyn started on 02/15/2012  Vancomycin started on 02/15/2012  HPI/Subjective: Feels better, but still has chills and headache.  Objective: Filed Vitals:   02/15/12 0622 02/15/12 0700 02/15/12 0832 02/15/12  2135  BP: 104/62 114/59 130/69 165/81  Pulse: 93 82 80 76  Temp: 99 F (37.2 C)  98.6 F (37 C) 99.9 F (37.7 C)  TempSrc: Oral  Oral Oral  Resp:   18 20  Height: 5\' 9"  (1.753 m)  5\' 9"  (1.753 m)   Weight: 104.327 kg (230 lb)  104.327 kg (230 lb)   SpO2: 99% 94% 96% 98%    Intake/Output Summary (Last 24 hours) at 02/16/12 1239 Last data filed at 02/16/12 0900  Gross per 24 hour  Intake   2890 ml  Output      0 ml  Net   2890 ml   Filed Weights   02/15/12 0622 02/15/12 0832  Weight: 104.327 kg (230 lb) 104.327 kg (230 lb)    Exam: General: Alert and awake, oriented x3, not in any acute distress. HEENT: anicteric sclera, pupils reactive to light and accommodation, EOMI CVS: S1-S2 clear, no murmur rubs or gallops Chest: clear to auscultation bilaterally, no wheezing, rales or rhonchi Abdomen: soft nontender, nondistended, normal bowel sounds, no organomegaly Extremities: no cyanosis, clubbing or edema noted bilaterally Neuro: Cranial nerves II-XII intact, no focal neurological deficits  Data Reviewed: Basic Metabolic Panel:  Lab 02/16/12 8119 02/14/12 1225  NA 141 136  K 3.8 3.6  CL 108 102  CO2 23 23  GLUCOSE 92 110*  BUN 9 11  CREATININE 1.05 0.90  CALCIUM 8.7 9.2  MG -- --  PHOS -- --   Liver Function Tests:  Lab 02/15/12 0548  AST 16  ALT 24  ALKPHOS 44  BILITOT 0.2*  PROT 6.3  ALBUMIN 3.2*   No results found for this basename: LIPASE:5,AMYLASE:5 in the  last 168 hours No results found for this basename: AMMONIA:5 in the last 168 hours CBC:  Lab 02/16/12 0538 02/15/12 1035 02/14/12 1225  WBC 5.8 6.5 7.7  NEUTROABS -- -- 6.1  HGB 12.2* 12.1* 13.9  HCT 35.5* 36.6* 40.6  MCV 89.0 89.1 87.1  PLT 214 179 227   Cardiac Enzymes: No results found for this basename: CKTOTAL:5,CKMB:5,CKMBINDEX:5,TROPONINI:5 in the last 168 hours BNP (last 3 results) No results found for this basename: PROBNP:3 in the last 8760 hours CBG: No results found for this  basename: GLUCAP:5 in the last 168 hours  Recent Results (from the past 240 hour(s))  CSF CULTURE     Status: Normal (Preliminary result)   Collection Time   02/15/12 12:44 AM      Component Value Range Status Comment   Specimen Description CSF   Final    Special Requests 1CC   Final    Gram Stain     Final    Value: WBC PRESENT, PREDOMINANTLY MONONUCLEAR     NO ORGANISMS SEEN     Performed at [facility]   Culture PENDING   Incomplete    Report Status PENDING   Incomplete   GRAM STAIN     Status: Normal   Collection Time   02/15/12 12:45 AM      Component Value Range Status Comment   Specimen Description CSF   Final    Special Requests 1CC   Final    Gram Stain     Final    Value: CYTOSPIN SLIDE     WBC PRESENT, PREDOMINANTLY MONONUCLEAR     NO ORGANISMS SEEN   Report Status 02/15/2012 FINAL   Final   CLOSTRIDIUM DIFFICILE BY PCR     Status: Normal   Collection Time   02/15/12 10:46 AM      Component Value Range Status Comment   C difficile by pcr NEGATIVE  NEGATIVE Final   URINE CULTURE     Status: Normal   Collection Time   02/15/12 12:00 PM      Component Value Range Status Comment   Specimen Description URINE, CLEAN CATCH   Final    Special Requests NONE   Final    Culture  Setup Time 02/15/2012 12:45   Final    Colony Count NO GROWTH   Final    Culture NO GROWTH   Final    Report Status 02/16/2012 FINAL   Final      Studies: Dg Chest 2 View  02/14/2012  *RADIOLOGY REPORT*  Clinical Data: Cough, fever, flu-like symptoms  CHEST - 2 VIEW  Comparison: 01/24/2011  Findings: Cardiomediastinal silhouette is stable.  No acute infiltrate or pleural effusion.  No pulmonary edema.  Bony thorax is stable.  IMPRESSION: No active disease.  No significant change.   Original Report Authenticated By: Natasha Mead, M.D.     Scheduled Meds:   . citalopram  20 mg Oral Daily  . diphenhydrAMINE  25 mg Oral Once  . enoxaparin (LOVENOX) injection  40 mg Subcutaneous Q24H  .  fluticasone  2 spray Each Nare Daily  . pantoprazole  40 mg Oral Q1200  . piperacillin-tazobactam (ZOSYN)  IV  3.375 g Intravenous Q8H  . saccharomyces boulardii  250 mg Oral BID  . vancomycin  2,000 mg Intravenous Once  . vancomycin  1,000 mg Intravenous Q12H  . DISCONTD: metroNIDAZOLE  500 mg Oral Q8H   Continuous Infusions:   . sodium chloride 100 mL/hr at 02/16/12  0116    Principal Problem:  *Fever Active Problems:  Diarrhea  Headache  Chronic back pain    Time spent: 35 minutes    [facility]  [facility] Pager [phone]. If 8PM-8AM, please contact night-coverage at [email or web address], password [facility] 02/16/2012, 12:39 PM  LOS: 2 days
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
## 2012-02-16 NOTE — Progress Notes (Signed)
Patient requested that I look at his throat, which upon assessment seemed to be slightly swollen and red with a white patch on the uvula.  Assured patient that progress note would be made and MD would follow up in the morning.  PAtient told to notify RN upon any discomfort or issues breathing and he acknowledges understanding.  Will continue to monitor patient.

## 2012-02-16 NOTE — Progress Notes (Signed)
MD notified that patient is c/o head and arms getting hot just after infusion of vancomycin completed and just after zosyn had been started.  Face and arms were red.  PAtient was also having periods of emesis after IV zofran administered. MD called pharmacy and it was suggested the rate of vancomycin be decreased when given per pharmacy.  Benadryl was ordered, and Zosyn was restarted and patient was educated on any signs/symptoms of allergic reaction.  Upon rounds on patient after benadryl administration and the restarting of zosyn the patient was progressing well.  Will continue to monitor patient.

## 2012-02-17 DIAGNOSIS — R509 Fever, unspecified: Principal | ICD-10-CM

## 2012-02-17 DIAGNOSIS — G8929 Other chronic pain: Secondary | ICD-10-CM | POA: Diagnosis not present

## 2012-02-17 DIAGNOSIS — M549 Dorsalgia, unspecified: Secondary | ICD-10-CM | POA: Diagnosis not present

## 2012-02-17 DIAGNOSIS — R197 Diarrhea, unspecified: Secondary | ICD-10-CM | POA: Diagnosis not present

## 2012-02-17 DIAGNOSIS — E669 Obesity, unspecified: Secondary | ICD-10-CM

## 2012-02-17 LAB — VANCOMYCIN, TROUGH: Vancomycin Tr: 6.5 ug/mL — ABNORMAL LOW (ref 10.0–20.0)

## 2012-02-17 LAB — HIV ANTIBODY (ROUTINE TESTING W REFLEX): HIV: NONREACTIVE

## 2012-02-17 MED ORDER — VANCOMYCIN HCL 1000 MG IV SOLR
1250.0000 mg | Freq: Three times a day (TID) | INTRAVENOUS | Status: DC
  Administered 2012-02-18 – 2012-02-20 (×7): 1250 mg via INTRAVENOUS
  Filled 2012-02-17 (×10): qty 1250
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
## 2012-02-17 NOTE — Progress Notes (Signed)
[facility] PROGRESS NOTE  [name] ZOX:[id number] DOB: 04/16/1971 DOA: 02/14/2012 PCP: [name], MD  Assessment/Plan: Principal Problem:  *Fever Active Problems:  Diarrhea  Headache  Chronic back pain   Fever  -Unclear origin for now, blood, urine, stool studies sent. -Negative C. Difficile PCR, RMSF titer, flu swab and clear urine. -Blood culture still NGTD, patient started on Zosyn and vancomycin empirically. -Could be toxic shock, anyway he is afebrile on vancomycin.  Headache  -With the fever there was concern about meningitis, patient does not have signs of meningismus.  -Lumbar puncture was done, and CSF came back clear.  -Still complaining about headache, worse when he sits up, I wonder now if it has post LP headache component  Diarrhea  -Patient has recent extensive antibiotics use, total of 3 courses of antibiotics over one month.  -Has 10-12 watery bowel movements per day, associated with flatulence and cramps.  -Does not have leukocytosis but has a relative neutrophilia.  -He started empirically on Flagyl to cover for C. difficile colitis and C. difficile PCR came back negative. -Stool is positive for lactoferrin.  Chronic back and neck pain  -Continue when necessary narcotics and when necessary muscle relaxants.   Code Status: Full Family Communication: Wife at bedside Disposition Plan: Remains as inpatient   Brief narrative: 41 year old Caucasian male with past medical history of chronic back pain, GERD and anxiety came into the hospital complaining about headache and fever of 102.7  Consultants:  Johny Sax of the infectious disease service  Procedures:  LP  Antibiotics:  Flagyl started on 02/15/2012 discontinued and the same day.  Zosyn started on 02/15/2012, discontinued on 9/21/ 2013  Vancomycin started on 02/15/2012  HPI/Subjective: His headache is much better but is still there. Denies any fever chills or  not.  Objective: Filed Vitals:   02/15/12 2135 02/16/12 1334 02/16/12 2117 02/17/12 0445  BP: 165/81 143/81 131/76 130/81  Pulse: 76 73 63 59  Temp: 99.9 F (37.7 C) 98.4 F (36.9 C) 98.6 F (37 C) 97.4 F (36.3 C)  TempSrc: Oral  Oral Oral  Resp: 20 18 18 18   Height:      Weight:      SpO2: 98% 99% 93% 94%    Intake/Output Summary (Last 24 hours) at 02/17/12 1029 Last data filed at 02/17/12 0700  Gross per 24 hour  Intake   2575 ml  Output      0 ml  Net   2575 ml   Filed Weights   02/15/12 0622 02/15/12 0832  Weight: 104.327 kg (230 lb) 104.327 kg (230 lb)    Exam: General: Alert and awake, oriented x3, not in any acute distress. HEENT: anicteric sclera, pupils reactive to light and accommodation, EOMI CVS: S1-S2 clear, no murmur rubs or gallops Chest: clear to auscultation bilaterally, no wheezing, rales or rhonchi Abdomen: soft nontender, nondistended, normal bowel sounds, no organomegaly Extremities: no cyanosis, clubbing or edema noted bilaterally Neuro: Cranial nerves II-XII intact, no focal neurological deficits  Data Reviewed: Basic Metabolic Panel:  Lab 02/16/12 8119 02/14/12 1225  NA 141 136  K 3.8 3.6  CL 108 102  CO2 23 23  GLUCOSE 92 110*  BUN 9 11  CREATININE 1.05 0.90  CALCIUM 8.7 9.2  MG -- --  PHOS -- --   Liver Function Tests:  Lab 02/15/12 0548  AST 16  ALT 24  ALKPHOS 44  BILITOT 0.2*  PROT 6.3  ALBUMIN 3.2*   No results found  for this basename: LIPASE:5,AMYLASE:5 in the last 168 hours No results found for this basename: AMMONIA:5 in the last 168 hours CBC:  Lab 02/16/12 0538 02/15/12 1035 02/14/12 1225  WBC 5.8 6.5 7.7  NEUTROABS -- -- 6.1  HGB 12.2* 12.1* 13.9  HCT 35.5* 36.6* 40.6  MCV 89.0 89.1 87.1  PLT 214 179 227   Cardiac Enzymes: No results found for this basename: CKTOTAL:5,CKMB:5,CKMBINDEX:5,TROPONINI:5 in the last 168 hours BNP (last 3 results) No results found for this basename: PROBNP:3 in the last 8760  hours CBG: No results found for this basename: GLUCAP:5 in the last 168 hours  Recent Results (from the past 240 hour(s))  CSF CULTURE     Status: Normal (Preliminary result)   Collection Time   02/15/12 12:44 AM      Component Value Range Status Comment   Specimen Description CSF   Final    Special Requests 1CC   Final    Gram Stain     Final    Value: WBC PRESENT, PREDOMINANTLY MONONUCLEAR     NO ORGANISMS SEEN     Performed at [facility]   Culture NO GROWTH 1 DAY   Final    Report Status PENDING   Incomplete   GRAM STAIN     Status: Normal   Collection Time   02/15/12 12:45 AM      Component Value Range Status Comment   Specimen Description CSF   Final    Special Requests 1CC   Final    Gram Stain     Final    Value: CYTOSPIN SLIDE     WBC PRESENT, PREDOMINANTLY MONONUCLEAR     NO ORGANISMS SEEN   Report Status 02/15/2012 FINAL   Final   CULTURE, BLOOD (ROUTINE X 2)     Status: Normal (Preliminary result)   Collection Time   02/15/12  5:40 AM      Component Value Range Status Comment   Specimen Description BLOOD RIGHT ARM   Final    Special Requests BOTTLES DRAWN AEROBIC AND ANAEROBIC 10CC   Final    Culture  Setup Time 02/15/2012 09:27   Final    Culture     Final    Value:        BLOOD CULTURE RECEIVED NO GROWTH TO DATE CULTURE WILL BE HELD FOR 5 DAYS BEFORE ISSUING A FINAL NEGATIVE REPORT   Report Status PENDING   Incomplete   CULTURE, BLOOD (ROUTINE X 2)     Status: Normal (Preliminary result)   Collection Time   02/15/12  5:50 AM      Component Value Range Status Comment   Specimen Description BLOOD RIGHT HAND   Final    Special Requests BOTTLES DRAWN AEROBIC AND ANAEROBIC 10CC   Final    Culture  Setup Time 02/15/2012 09:27   Final    Culture     Final    Value:        BLOOD CULTURE RECEIVED NO GROWTH TO DATE CULTURE WILL BE HELD FOR 5 DAYS BEFORE ISSUING A FINAL NEGATIVE REPORT   Report Status PENDING   Incomplete   CLOSTRIDIUM DIFFICILE BY PCR      Status: Normal   Collection Time   02/15/12 10:46 AM      Component Value Range Status Comment   C difficile by pcr NEGATIVE  NEGATIVE Final   URINE CULTURE     Status: Normal   Collection Time   02/15/12 12:00 PM  Component Value Range Status Comment   Specimen Description URINE, CLEAN CATCH   Final    Special Requests NONE   Final    Culture  Setup Time 02/15/2012 12:45   Final    Colony Count NO GROWTH   Final    Culture NO GROWTH   Final    Report Status 02/16/2012 FINAL   Final      Studies: No results found.  Scheduled Meds:    . citalopram  20 mg Oral Daily  . enoxaparin (LOVENOX) injection  40 mg Subcutaneous Q24H  . fluticasone  2 spray Each Nare Daily  . pantoprazole  40 mg Oral Q1200  . saccharomyces boulardii  250 mg Oral BID  . vancomycin  1,000 mg Intravenous Q12H  . DISCONTD: piperacillin-tazobactam (ZOSYN)  IV  3.375 g Intravenous Q8H   Continuous Infusions:    . sodium chloride 100 mL/hr at 02/17/12 0345    Principal Problem:  *Fever Active Problems:  Diarrhea  Headache  Chronic back pain    Time spent: 35 minutes    [facility]  [facility] Pager [phone]. If 8PM-8AM, please contact night-coverage at [email or web address], password [facility] 02/17/2012, 10:29 AM  LOS: 3 days

## 2012-02-17 NOTE — Progress Notes (Signed)
INFECTIOUS DISEASE PROGRESS NOTE  ID: Grigor Lipschutz is a 41 y.o. male with   Principal Problem:  *Fever Active Problems:  Diarrhea  Headache  Chronic back pain  Subjective: Continued headache  Abtx:  Anti-infectives     Start     Dose/Rate Route Frequency Ordered Stop   02/16/12 0600   vancomycin (VANCOCIN) IVPB 1000 mg/200 mL premix        1,000 mg 100 mL/hr over 120 Minutes Intravenous Every 12 hours 02/15/12 1737     02/15/12 1800   piperacillin-tazobactam (ZOSYN) IVPB 3.375 g  Status:  Discontinued        3.375 g 12.5 mL/hr over 240 Minutes Intravenous 3 times per day 02/15/12 1737 02/16/12 1427   02/15/12 1745   vancomycin (VANCOCIN) 2,000 mg in sodium chloride 0.9 % 500 mL IVPB        2,000 mg 250 mL/hr over 120 Minutes Intravenous  Once 02/15/12 1737 02/15/12 2059   02/15/12 1400   metroNIDAZOLE (FLAGYL) tablet 500 mg  Status:  Discontinued        500 mg Oral 3 times per day 02/15/12 1020 02/15/12 1728   02/15/12 0600   doxycycline (VIBRAMYCIN) 100 mg in dextrose 5 % 250 mL IVPB  Status:  Discontinued        100 mg 125 mL/hr over 120 Minutes Intravenous  Once 02/15/12 0554 02/15/12 0658          Medications:  Scheduled:   . citalopram  20 mg Oral Daily  . enoxaparin (LOVENOX) injection  40 mg Subcutaneous Q24H  . fluticasone  2 spray Each Nare Daily  . pantoprazole  40 mg Oral Q1200  . saccharomyces boulardii  250 mg Oral BID  . vancomycin  1,000 mg Intravenous Q12H  . DISCONTD: piperacillin-tazobactam (ZOSYN)  IV  3.375 g Intravenous Q8H    Objective: Vital signs in last 24 hours: Temp:  [97.4 F (36.3 C)-98.6 F (37 C)] 97.4 F (36.3 C) (09/22 0445) Pulse Rate:  [59-63] 59  (09/22 0445) Resp:  [18] 18  (09/22 0445) BP: (130-131)/(76-81) 130/81 mmHg (09/22 0445) SpO2:  [93 %-94 %] 94 % (09/22 0445)   General appearance: alert, cooperative and mild distress Resp: clear to auscultation bilaterally Cardio: regular rate and rhythm GI: normal  findings: bowel sounds normal and soft, non-tender Extremities: R leg with mild warmth of knee, wounds well healed.   Lab Results  Basename 02/16/12 0538 02/15/12 1035  WBC 5.8 6.5  HGB 12.2* 12.1*  HCT 35.5* 36.6*  NA 141 --  K 3.8 --  CL 108 --  CO2 23 --  BUN 9 --  CREATININE 1.05 --  GLU -- --   Liver Panel  Basename 02/15/12 0548  PROT 6.3  ALBUMIN 3.2*  AST 16  ALT 24  ALKPHOS 44  BILITOT 0.2*  BILIDIR <0.1  IBILI NOT CALCULATED   Sedimentation Rate No results found for this basename: ESRSEDRATE in the last 72 hours C-Reactive Protein No results found for this basename: CRP:2 in the last 72 hours  Microbiology: Recent Results (from the past 240 hour(s))  CSF CULTURE     Status: Normal (Preliminary result)   Collection Time   02/15/12 12:44 AM      Component Value Range Status Comment   Specimen Description CSF   Final    Special Requests 1CC   Final    Gram Stain     Final    Value: WBC PRESENT, PREDOMINANTLY MONONUCLEAR  NO ORGANISMS SEEN     Performed at [facility]   Culture NO GROWTH 1 DAY   Final    Report Status PENDING   Incomplete   GRAM STAIN     Status: Normal   Collection Time   02/15/12 12:45 AM      Component Value Range Status Comment   Specimen Description CSF   Final    Special Requests 1CC   Final    Gram Stain     Final    Value: CYTOSPIN SLIDE     WBC PRESENT, PREDOMINANTLY MONONUCLEAR     NO ORGANISMS SEEN   Report Status 02/15/2012 FINAL   Final   CULTURE, BLOOD (ROUTINE X 2)     Status: Normal (Preliminary result)   Collection Time   02/15/12  5:40 AM      Component Value Range Status Comment   Specimen Description BLOOD RIGHT ARM   Final    Special Requests BOTTLES DRAWN AEROBIC AND ANAEROBIC 10CC   Final    Culture  Setup Time 02/15/2012 09:27   Final    Culture     Final    Value:        BLOOD CULTURE RECEIVED NO GROWTH TO DATE CULTURE WILL BE HELD FOR 5 DAYS BEFORE ISSUING A FINAL NEGATIVE REPORT   Report  Status PENDING   Incomplete   CULTURE, BLOOD (ROUTINE X 2)     Status: Normal (Preliminary result)   Collection Time   02/15/12  5:50 AM      Component Value Range Status Comment   Specimen Description BLOOD RIGHT HAND   Final    Special Requests BOTTLES DRAWN AEROBIC AND ANAEROBIC 10CC   Final    Culture  Setup Time 02/15/2012 09:27   Final    Culture     Final    Value:        BLOOD CULTURE RECEIVED NO GROWTH TO DATE CULTURE WILL BE HELD FOR 5 DAYS BEFORE ISSUING A FINAL NEGATIVE REPORT   Report Status PENDING   Incomplete   CLOSTRIDIUM DIFFICILE BY PCR     Status: Normal   Collection Time   02/15/12 10:46 AM      Component Value Range Status Comment   C difficile by pcr NEGATIVE  NEGATIVE Final   URINE CULTURE     Status: Normal   Collection Time   02/15/12 12:00 PM      Component Value Range Status Comment   Specimen Description URINE, CLEAN CATCH   Final    Special Requests NONE   Final    Culture  Setup Time 02/15/2012 12:45   Final    Colony Count NO GROWTH   Final    Culture NO GROWTH   Final    Report Status 02/16/2012 FINAL   Final     Studies/Results: No results found.   Assessment/Plan: Fever ? TSS  Total days of antibiotics 3 (vanco)         Appears to have defervesced since 9-20. Will check MRI of his knee.  Headache mgmt  Johny Sax Infectious Diseases [phone] 02/17/2012, 1:48 PM   LOS: 3 days

## 2012-02-18 ENCOUNTER — Inpatient Hospital Stay (HOSPITAL_COMMUNITY): Payer: 59

## 2012-02-18 DIAGNOSIS — M549 Dorsalgia, unspecified: Secondary | ICD-10-CM | POA: Diagnosis not present

## 2012-02-18 DIAGNOSIS — R509 Fever, unspecified: Secondary | ICD-10-CM | POA: Diagnosis not present

## 2012-02-18 DIAGNOSIS — G8929 Other chronic pain: Secondary | ICD-10-CM | POA: Diagnosis not present

## 2012-02-18 DIAGNOSIS — S7010XA Contusion of unspecified thigh, initial encounter: Secondary | ICD-10-CM | POA: Diagnosis not present

## 2012-02-18 DIAGNOSIS — L03119 Cellulitis of unspecified part of limb: Secondary | ICD-10-CM | POA: Diagnosis not present

## 2012-02-18 DIAGNOSIS — R51 Headache: Secondary | ICD-10-CM | POA: Diagnosis not present

## 2012-02-18 LAB — CSF CULTURE W GRAM STAIN

## 2012-02-18 MED ORDER — GADOBENATE DIMEGLUMINE 529 MG/ML IV SOLN: 20.0000 mL | Freq: Once | INTRAVENOUS | Status: AC | PRN

## 2012-02-18 MED ORDER — MORPHINE SULFATE 2 MG/ML IJ SOLN
1.0000 mg | Freq: Once | INTRAMUSCULAR | Status: AC
  Administered 2012-02-18: 1 mg via INTRAVENOUS
  Filled 2012-02-18: qty 1

## 2012-02-18 MED ORDER — ENOXAPARIN SODIUM 60 MG/0.6ML ~~LOC~~ SOLN: 50.0000 mg | SUBCUTANEOUS | Status: DC

## 2012-02-18 NOTE — Progress Notes (Signed)
[facility] PROGRESS NOTE  Hester Forget BMW:[id number] DOB: 1970-06-21 DOA: 02/14/2012 PCP: [name], MD  Assessment/Plan: Principal Problem:  *Fever Active Problems:  Diarrhea  Headache  Chronic back pain   Fever  -Unclear origin for now, blood, urine, stool studies sent. -Negative C. Difficile PCR, RMSF titer, flu swab and clear urine. -Blood culture still NGTD, patient started on Zosyn and vancomycin empirically. -Could be toxic shock, anyway he is afebrile on vancomycin. -MRI of the right knee ordered, pending.-  Headache  -With the fever there was concern about meningitis, patient does not have signs of meningismus.  -Lumbar puncture was done, and CSF came back clear.  -His headache is worse today, raising concerns about CSF leak. -Interventional radiology to evaluate, hold Lovenox today.  Diarrhea  -Patient has recent extensive antibiotics use, total of 3 courses of antibiotics over one month.  -Has 10-12 watery bowel movements per day, associated with flatulence and cramps.  -Does not have leukocytosis but has a relative neutrophilia.  -He started empirically on Flagyl to cover for C. difficile colitis and C. difficile PCR came back negative. -Stool is positive for lactoferrin.  Chronic back and neck pain  -Continue when necessary narcotics and when necessary muscle relaxants.   Code Status: Full Family Communication: Wife at bedside Disposition Plan: Remains as inpatient   Brief narrative: 41 year old Caucasian male with past medical history of chronic back pain, GERD and anxiety came into the hospital complaining about headache and fever of 102.7  Consultants:  Johny Sax of the infectious disease service  Procedures:  LP  Antibiotics:  Flagyl started on 02/15/2012 discontinued and the same day.  Zosyn started on 02/15/2012, discontinued on 9/21/ 2013  Vancomycin started on 02/15/2012  HPI/Subjective: Headache is worse  today, ?? CSF leak.  Objective: Filed Vitals:   02/17/12 0445 02/17/12 1300 02/17/12 2136 02/18/12 0500  BP: 130/81 136/82 143/85 134/83  Pulse: 59 62 55 66  Temp: 97.4 F (36.3 C) 98.6 F (37 C) 98.2 F (36.8 C) 98.3 F (36.8 C)  TempSrc: Oral  Oral Oral  Resp: 18 18 18 18   Height:      Weight:      SpO2: 94% 93% 95% 92%    Intake/Output Summary (Last 24 hours) at 02/18/12 1059 Last data filed at 02/17/12 1845  Gross per 24 hour  Intake   3300 ml  Output      0 ml  Net   3300 ml   Filed Weights   02/15/12 0622 02/15/12 0832  Weight: 104.327 kg (230 lb) 104.327 kg (230 lb)    Exam: General: Alert and awake, oriented x3, not in any acute distress. HEENT: anicteric sclera, pupils reactive to light and accommodation, EOMI CVS: S1-S2 clear, no murmur rubs or gallops Chest: clear to auscultation bilaterally, no wheezing, rales or rhonchi Abdomen: soft nontender, nondistended, normal bowel sounds, no organomegaly Extremities: no cyanosis, clubbing or edema noted bilaterally Neuro: Cranial nerves II-XII intact, no focal neurological deficits  Data Reviewed: Basic Metabolic Panel:  Lab 02/16/12 2725 02/14/12 1225  NA 141 136  K 3.8 3.6  CL 108 102  CO2 23 23  GLUCOSE 92 110*  BUN 9 11  CREATININE 1.05 0.90  CALCIUM 8.7 9.2  MG -- --  PHOS -- --   Liver Function Tests:  Lab 02/15/12 0548  AST 16  ALT 24  ALKPHOS 44  BILITOT 0.2*  PROT 6.3  ALBUMIN 3.2*   No results found for this basename:  LIPASE:5,AMYLASE:5 in the last 168 hours No results found for this basename: AMMONIA:5 in the last 168 hours CBC:  Lab 02/16/12 0538 02/15/12 1035 02/14/12 1225  WBC 5.8 6.5 7.7  NEUTROABS -- -- 6.1  HGB 12.2* 12.1* 13.9  HCT 35.5* 36.6* 40.6  MCV 89.0 89.1 87.1  PLT 214 179 227   Cardiac Enzymes: No results found for this basename: CKTOTAL:5,CKMB:5,CKMBINDEX:5,TROPONINI:5 in the last 168 hours BNP (last 3 results) No results found for this basename: PROBNP:3  in the last 8760 hours CBG: No results found for this basename: GLUCAP:5 in the last 168 hours  Recent Results (from the past 240 hour(s))  CSF CULTURE     Status: Normal (Preliminary result)   Collection Time   02/15/12 12:44 AM      Component Value Range Status Comment   Specimen Description CSF   Final    Special Requests 1CC   Final    Gram Stain     Final    Value: WBC PRESENT, PREDOMINANTLY MONONUCLEAR     NO ORGANISMS SEEN     Performed at [facility]   Culture NO GROWTH 2 DAYS   Final    Report Status PENDING   Incomplete   GRAM STAIN     Status: Normal   Collection Time   02/15/12 12:45 AM      Component Value Range Status Comment   Specimen Description CSF   Final    Special Requests 1CC   Final    Gram Stain     Final    Value: CYTOSPIN SLIDE     WBC PRESENT, PREDOMINANTLY MONONUCLEAR     NO ORGANISMS SEEN   Report Status 02/15/2012 FINAL   Final   CULTURE, BLOOD (ROUTINE X 2)     Status: Normal (Preliminary result)   Collection Time   02/15/12  5:40 AM      Component Value Range Status Comment   Specimen Description BLOOD RIGHT ARM   Final    Special Requests BOTTLES DRAWN AEROBIC AND ANAEROBIC 10CC   Final    Culture  Setup Time 02/15/2012 09:27   Final    Culture     Final    Value:        BLOOD CULTURE RECEIVED NO GROWTH TO DATE CULTURE WILL BE HELD FOR 5 DAYS BEFORE ISSUING A FINAL NEGATIVE REPORT   Report Status PENDING   Incomplete   CULTURE, BLOOD (ROUTINE X 2)     Status: Normal (Preliminary result)   Collection Time   02/15/12  5:50 AM      Component Value Range Status Comment   Specimen Description BLOOD RIGHT HAND   Final    Special Requests BOTTLES DRAWN AEROBIC AND ANAEROBIC 10CC   Final    Culture  Setup Time 02/15/2012 09:27   Final    Culture     Final    Value:        BLOOD CULTURE RECEIVED NO GROWTH TO DATE CULTURE WILL BE HELD FOR 5 DAYS BEFORE ISSUING A FINAL NEGATIVE REPORT   Report Status PENDING   Incomplete   CLOSTRIDIUM  DIFFICILE BY PCR     Status: Normal   Collection Time   02/15/12 10:46 AM      Component Value Range Status Comment   C difficile by pcr NEGATIVE  NEGATIVE Final   STOOL CULTURE     Status: Normal (Preliminary result)   Collection Time   02/15/12 10:47 AM  Component Value Range Status Comment   Specimen Description STOOL   Final    Special Requests NONE   Final    Culture NO SUSPICIOUS COLONIES, CONTINUING TO HOLD   Final    Report Status PENDING   Incomplete   URINE CULTURE     Status: Normal   Collection Time   02/15/12 12:00 PM      Component Value Range Status Comment   Specimen Description URINE, CLEAN CATCH   Final    Special Requests NONE   Final    Culture  Setup Time 02/15/2012 12:45   Final    Colony Count NO GROWTH   Final    Culture NO GROWTH   Final    Report Status 02/16/2012 FINAL   Final      Studies: No results found.  Scheduled Meds:    . citalopram  20 mg Oral Daily  . enoxaparin (LOVENOX) injection  50 mg Subcutaneous Q24H  . fluticasone  2 spray Each Nare Daily  . pantoprazole  40 mg Oral Q1200  . saccharomyces boulardii  250 mg Oral BID  . vancomycin  1,250 mg Intravenous Q8H  . DISCONTD: enoxaparin (LOVENOX) injection  40 mg Subcutaneous Q24H  . DISCONTD: vancomycin  1,000 mg Intravenous Q12H   Continuous Infusions:    . sodium chloride 100 mL/hr at 02/18/12 0440    Principal Problem:  *Fever Active Problems:  Diarrhea  Headache  Chronic back pain    Time spent: 35 minutes    [facility] A  [facility] Pager [phone]. If 8PM-8AM, please contact night-coverage at [email or web address], password [facility] 02/18/2012, 10:59 AM  LOS: 4 days

## 2012-02-18 NOTE — Progress Notes (Addendum)
INFECTIOUS DISEASE PROGRESS NOTE  ID: [name] is a 41 y.o. male with   Principal Problem:  *Fever Active Problems:  Diarrhea  Headache  Chronic back pain  Subjective: C/o headaches  Abtx:  Anti-infectives     Start     Dose/Rate Route Frequency Ordered Stop   02/18/12 0200   vancomycin (VANCOCIN) 1,250 mg in sodium chloride 0.9 % 250 mL IVPB        1,250 mg 125 mL/hr over 120 Minutes Intravenous Every 8 hours 02/17/12 1906     02/16/12 0600   vancomycin (VANCOCIN) IVPB 1000 mg/200 mL premix  Status:  Discontinued        1,000 mg 100 mL/hr over 120 Minutes Intravenous Every 12 hours 02/15/12 1737 02/17/12 1906   02/15/12 1800   piperacillin-tazobactam (ZOSYN) IVPB 3.375 g  Status:  Discontinued        3.375 g 12.5 mL/hr over 240 Minutes Intravenous 3 times per day 02/15/12 1737 02/16/12 1427   02/15/12 1745   vancomycin (VANCOCIN) 2,000 mg in sodium chloride 0.9 % 500 mL IVPB        2,000 mg 250 mL/hr over 120 Minutes Intravenous  Once 02/15/12 1737 02/15/12 2059   02/15/12 1400   metroNIDAZOLE (FLAGYL) tablet 500 mg  Status:  Discontinued        500 mg Oral 3 times per day 02/15/12 1020 02/15/12 1728   02/15/12 0600   doxycycline (VIBRAMYCIN) 100 mg in dextrose 5 % 250 mL IVPB  Status:  Discontinued        100 mg 125 mL/hr over 120 Minutes Intravenous  Once 02/15/12 0554 02/15/12 0658          Medications:  Scheduled:   . citalopram  20 mg Oral Daily  . enoxaparin (LOVENOX) injection  50 mg Subcutaneous Q24H  . fluticasone  2 spray Each Nare Daily  . pantoprazole  40 mg Oral Q1200  . saccharomyces boulardii  250 mg Oral BID  . vancomycin  1,250 mg Intravenous Q8H  . DISCONTD: enoxaparin (LOVENOX) injection  40 mg Subcutaneous Q24H  . DISCONTD: vancomycin  1,000 mg Intravenous Q12H    Objective: Vital signs in last 24 hours: Temp:  [98.2 F (36.8 C)-98.6 F (37 C)] 98.3 F (36.8 C) (09/23 0500) Pulse Rate:  [55-66] 66  (09/23 0500) Resp:  [18]  18  (09/23 0500) BP: (134-143)/(82-85) 134/83 mmHg (09/23 0500) SpO2:  [92 %-95 %] 92 % (09/23 0500)   General appearance: alert and mild distress Resp: clear to auscultation bilaterally Cardio: regular rate and rhythm GI: normal findings: bowel sounds normal and soft, non-tender  Lab Results  Basename 02/16/12 0538  WBC 5.8  HGB 12.2*  HCT 35.5*  NA 141  K 3.8  CL 108  CO2 23  BUN 9  CREATININE 1.05  GLU --   Liver Panel No results found for this basename: PROT:2,ALBUMIN:2,AST:2,ALT:2,ALKPHOS:2,BILITOT:2,BILIDIR:2,IBILI:2 in the last 72 hours Sedimentation Rate No results found for this basename: ESRSEDRATE in the last 72 hours C-Reactive Protein No results found for this basename: CRP:2 in the last 72 hours  Microbiology: Recent Results (from the past 240 hour(s))  CSF CULTURE     Status: Normal (Preliminary result)   Collection Time   02/15/12 12:44 AM      Component Value Range Status Comment   Specimen Description CSF   Final    Special Requests 1CC   Final    Gram Stain     Final  Value: WBC PRESENT, PREDOMINANTLY MONONUCLEAR     NO ORGANISMS SEEN     Performed at [facility]   Culture NO GROWTH 2 DAYS   Final    Report Status PENDING   Incomplete   GRAM STAIN     Status: Normal   Collection Time   02/15/12 12:45 AM      Component Value Range Status Comment   Specimen Description CSF   Final    Special Requests 1CC   Final    Gram Stain     Final    Value: CYTOSPIN SLIDE     WBC PRESENT, PREDOMINANTLY MONONUCLEAR     NO ORGANISMS SEEN   Report Status 02/15/2012 FINAL   Final   CULTURE, BLOOD (ROUTINE X 2)     Status: Normal (Preliminary result)   Collection Time   02/15/12  5:40 AM      Component Value Range Status Comment   Specimen Description BLOOD RIGHT ARM   Final    Special Requests BOTTLES DRAWN AEROBIC AND ANAEROBIC 10CC   Final    Culture  Setup Time 02/15/2012 09:27   Final    Culture     Final    Value:        BLOOD CULTURE  RECEIVED NO GROWTH TO DATE CULTURE WILL BE HELD FOR 5 DAYS BEFORE ISSUING A FINAL NEGATIVE REPORT   Report Status PENDING   Incomplete   CULTURE, BLOOD (ROUTINE X 2)     Status: Normal (Preliminary result)   Collection Time   02/15/12  5:50 AM      Component Value Range Status Comment   Specimen Description BLOOD RIGHT HAND   Final    Special Requests BOTTLES DRAWN AEROBIC AND ANAEROBIC 10CC   Final    Culture  Setup Time 02/15/2012 09:27   Final    Culture     Final    Value:        BLOOD CULTURE RECEIVED NO GROWTH TO DATE CULTURE WILL BE HELD FOR 5 DAYS BEFORE ISSUING A FINAL NEGATIVE REPORT   Report Status PENDING   Incomplete   CLOSTRIDIUM DIFFICILE BY PCR     Status: Normal   Collection Time   02/15/12 10:46 AM      Component Value Range Status Comment   C difficile by pcr NEGATIVE  NEGATIVE Final   STOOL CULTURE     Status: Normal (Preliminary result)   Collection Time   02/15/12 10:47 AM      Component Value Range Status Comment   Specimen Description STOOL   Final    Special Requests NONE   Final    Culture NO SUSPICIOUS COLONIES, CONTINUING TO HOLD   Final    Report Status PENDING   Incomplete   URINE CULTURE     Status: Normal   Collection Time   02/15/12 12:00 PM      Component Value Range Status Comment   Specimen Description URINE, CLEAN CATCH   Final    Special Requests NONE   Final    Culture  Setup Time 02/15/2012 12:45   Final    Colony Count NO GROWTH   Final    Culture NO GROWTH   Final    Report Status 02/16/2012 FINAL   Final     Studies/Results: No results found.   Assessment/Plan:  Fever  ? TSS  Previous trauma/open wound to R knee Total days of antibiotics 4 (vanco) Continues to be afebrile He and  his wife are concerned he has CSF leak from LP. Will ask IR to eval ( he is on LMWH prophylactic dose).  MRI of his knee is pending.   Will hold 9-24 AM dose of LMWH- if headache persists, IR will re-eval.  My great appreciation to them.   Johny Sax Infectious Diseases [phone] 02/18/2012, 10:49 AM   LOS: 4 days

## 2012-02-19 ENCOUNTER — Inpatient Hospital Stay (HOSPITAL_COMMUNITY): Payer: 59

## 2012-02-19 DIAGNOSIS — R51 Headache: Secondary | ICD-10-CM | POA: Diagnosis not present

## 2012-02-19 DIAGNOSIS — R509 Fever, unspecified: Secondary | ICD-10-CM | POA: Diagnosis not present

## 2012-02-19 DIAGNOSIS — M549 Dorsalgia, unspecified: Secondary | ICD-10-CM | POA: Diagnosis not present

## 2012-02-19 DIAGNOSIS — G8929 Other chronic pain: Secondary | ICD-10-CM | POA: Diagnosis not present

## 2012-02-19 LAB — CBC
HCT: 39 % (ref 39.0–52.0)
Hemoglobin: 13.2 g/dL (ref 13.0–17.0)
MCV: 87.4 fL (ref 78.0–100.0)
RDW: 12.9 % (ref 11.5–15.5)
WBC: 6.1 10*3/uL (ref 4.0–10.5)

## 2012-02-19 LAB — BASIC METABOLIC PANEL
BUN: 13 mg/dL (ref 6–23)
CO2: 30 mEq/L (ref 19–32)
Chloride: 105 mEq/L (ref 96–112)
Creatinine, Ser: 0.97 mg/dL (ref 0.50–1.35)
GFR calc Af Amer: >90 mL/min (ref >90)
Potassium: 4 mEq/L (ref 3.5–5.1)

## 2012-02-19 LAB — STOOL CULTURE

## 2012-02-19 NOTE — Progress Notes (Signed)
Patient ID: [name], male   DOB: Apr 18, 1971, 42 y.o.   MRN: [id number] The patient is referred for a spinal headache and possible epidural blood patch. His CSF is clear but he does have a suspicious superficial infection about the right knee. Blood patch is deferred until infection is resolved. I will reiterate flat BR orders,

## 2012-02-19 NOTE — Progress Notes (Signed)
[facility] PROGRESS NOTE  [name] UKG:[id number] DOB: [birth year] DOA: 02/14/2012 PCP: [name], MD  Assessment/Plan: Principal Problem:  *Fever Active Problems:  Diarrhea  Headache  Chronic back pain   Fever  -Unclear origin for now, blood, urine, stool studies negative to date -Negative C. Difficile PCR, RMSF titer, flu swab and clear urine. -Blood culture still NGTD, patient started on Zosyn and vancomycin empirically. -Could be toxic shock, anyway he is afebrile on vancomycin. -MRI showed abscess above right knee without involvement of joint space. I will discuss with infectious disease if these need to be drained.  Headache  -With the fever there was concern about meningitis, patient does not have signs of meningismus.  -Lumbar puncture was done, and CSF came back clear.  -His headache is worse today, raising concerns about CSF leak. -Interventional radiology to evaluate, Lovenox discontinued last dose 02/18/2012  Diarrhea  -Patient has recent extensive antibiotics use, total of 3 courses of antibiotics over one month.  -Has 10-12 watery bowel movements per day, associated with flatulence and cramps.  -Does not have leukocytosis but has a relative neutrophilia.  -He started empirically on Flagyl to cover for C. difficile colitis and C. difficile PCR came back negative. -Stool is positive for lactoferrin. -Diarrhea has improved  Chronic back and neck pain  -Continue when necessary narcotics and when necessary muscle relaxants.   Code Status: Full Family Communication: Wife at bedside Disposition Plan: Remains as inpatient   Brief narrative: 41 year old Caucasian male with past medical history of chronic back pain, GERD and anxiety came into the hospital complaining about headache and fever of 102.7  Consultants:  Johny Sax of the infectious disease service  Procedures:  LP  Antibiotics:  Flagyl started on 02/15/2012 discontinued  and the same day.  Zosyn started on 02/15/2012, discontinued on 9/21/ 2013  Vancomycin started on 02/15/2012  HPI/Subjective: Headache is worse today, ?? CSF leak.  Objective: Filed Vitals:   02/18/12 0500 02/18/12 1402 02/18/12 2251 02/19/12 0521  BP: 134/83 129/81 153/74 107/68  Pulse: 66 66 60 57  Temp: 98.3 F (36.8 C) 98.1 F (36.7 C) 99 F (37.2 C) 98.3 F (36.8 C)  TempSrc: Oral Oral Oral Oral  Resp: 18 18 19 18   Height:      Weight:      SpO2: 92% 97% 98% 95%    Intake/Output Summary (Last 24 hours) at 02/19/12 1149 Last data filed at 02/19/12 0151  Gross per 24 hour  Intake    730 ml  Output      0 ml  Net    730 ml   Filed Weights   02/15/12 0622 02/15/12 0832  Weight: 104.327 kg (230 lb) 104.327 kg (230 lb)    Exam: General: Alert and awake, oriented x3, not in any acute distress. HEENT: anicteric sclera, pupils reactive to light and accommodation, EOMI CVS: S1-S2 clear, no murmur rubs or gallops Chest: clear to auscultation bilaterally, no wheezing, rales or rhonchi Abdomen: soft nontender, nondistended, normal bowel sounds, no organomegaly Extremities: no cyanosis, clubbing or edema noted bilaterally Neuro: Cranial nerves II-XII intact, no focal neurological deficits  Data Reviewed: Basic Metabolic Panel:  Lab 02/19/12 7628 02/16/12 0538 02/14/12 1225  NA 143 141 136  K 4.0 3.8 3.6  CL 105 108 102  CO2 30 23 23   GLUCOSE 97 92 110*  BUN 13 9 11   CREATININE 0.97 1.05 0.90  CALCIUM 9.9 8.7 9.2  MG -- -- --  PHOS -- -- --  Liver Function Tests:  Lab 02/15/12 0548  AST 16  ALT 24  ALKPHOS 44  BILITOT 0.2*  PROT 6.3  ALBUMIN 3.2*   No results found for this basename: LIPASE:5,AMYLASE:5 in the last 168 hours No results found for this basename: AMMONIA:5 in the last 168 hours CBC:  Lab 02/19/12 0617 02/16/12 0538 02/15/12 1035 02/14/12 1225  WBC 6.1 5.8 6.5 7.7  NEUTROABS -- -- -- 6.1  HGB 13.2 12.2* 12.1* 13.9  HCT 39.0 35.5*  36.6* 40.6  MCV 87.4 89.0 89.1 87.1  PLT 316 214 179 227   Cardiac Enzymes: No results found for this basename: CKTOTAL:5,CKMB:5,CKMBINDEX:5,TROPONINI:5 in the last 168 hours BNP (last 3 results) No results found for this basename: PROBNP:3 in the last 8760 hours CBG: No results found for this basename: GLUCAP:5 in the last 168 hours  Recent Results (from the past 240 hour(s))  CSF CULTURE     Status: Normal   Collection Time   02/15/12 12:44 AM      Component Value Range Status Comment   Specimen Description CSF   Final    Special Requests 1CC   Final    Gram Stain     Final    Value: WBC PRESENT, PREDOMINANTLY MONONUCLEAR     NO ORGANISMS SEEN     Performed at [facility]   Culture NO GROWTH 3 DAYS   Final    Report Status 02/18/2012 FINAL   Final   GRAM STAIN     Status: Normal   Collection Time   02/15/12 12:45 AM      Component Value Range Status Comment   Specimen Description CSF   Final    Special Requests 1CC   Final    Gram Stain     Final    Value: CYTOSPIN SLIDE     WBC PRESENT, PREDOMINANTLY MONONUCLEAR     NO ORGANISMS SEEN   Report Status 02/15/2012 FINAL   Final   CULTURE, BLOOD (ROUTINE X 2)     Status: Normal (Preliminary result)   Collection Time   02/15/12  5:40 AM      Component Value Range Status Comment   Specimen Description BLOOD RIGHT ARM   Final    Special Requests BOTTLES DRAWN AEROBIC AND ANAEROBIC 10CC   Final    Culture  Setup Time 02/15/2012 09:27   Final    Culture     Final    Value:        BLOOD CULTURE RECEIVED NO GROWTH TO DATE CULTURE WILL BE HELD FOR 5 DAYS BEFORE ISSUING A FINAL NEGATIVE REPORT   Report Status PENDING   Incomplete   CULTURE, BLOOD (ROUTINE X 2)     Status: Normal (Preliminary result)   Collection Time   02/15/12  5:50 AM      Component Value Range Status Comment   Specimen Description BLOOD RIGHT HAND   Final    Special Requests BOTTLES DRAWN AEROBIC AND ANAEROBIC 10CC   Final    Culture  Setup Time  02/15/2012 09:27   Final    Culture     Final    Value:        BLOOD CULTURE RECEIVED NO GROWTH TO DATE CULTURE WILL BE HELD FOR 5 DAYS BEFORE ISSUING A FINAL NEGATIVE REPORT   Report Status PENDING   Incomplete   CLOSTRIDIUM DIFFICILE BY PCR     Status: Normal   Collection Time   02/15/12 10:46 AM  Component Value Range Status Comment   C difficile by pcr NEGATIVE  NEGATIVE Final   STOOL CULTURE     Status: Normal   Collection Time   02/15/12 10:47 AM      Component Value Range Status Comment   Specimen Description STOOL   Final    Special Requests NONE   Final    Culture     Final    Value: NO SALMONELLA, SHIGELLA, CAMPYLOBACTER, YERSINIA, OR E.COLI 0157:H7 ISOLATED   Report Status 02/19/2012 FINAL   Final   URINE CULTURE     Status: Normal   Collection Time   02/15/12 12:00 PM      Component Value Range Status Comment   Specimen Description URINE, CLEAN CATCH   Final    Special Requests NONE   Final    Culture  Setup Time 02/15/2012 12:45   Final    Colony Count NO GROWTH   Final    Culture NO GROWTH   Final    Report Status 02/16/2012 FINAL   Final      Studies: Ct Head Wo Contrast  02/18/2012  *RADIOLOGY REPORT*  Clinical Data: 41 year old male intractable headache.  Lumbar puncture 72 hours ago.  CT HEAD WITHOUT CONTRAST  Technique:  Contiguous axial images were obtained from the base of the skull through the vertex without contrast.  Comparison: 01/15/2009.  Findings: Visualized orbits and scalp soft tissues are within normal limits.  Visualized paranasal sinuses and mastoids are clear.  No acute osseous abnormality identified.  Stable cerebral volume.  No ventriculomegaly.  Basilar cisterns appear stable. Gray-white matter differentiation is within normal limits throughout the brain.  No midline shift, mass effect, or evidence of mass lesion.  No acute intracranial hemorrhage identified.  No evidence of cortically based acute infarction identified.  The superior sagittal  sinus at the vertex appears larger than on prior studies, but not more dense than the other intracranial vasculature.  IMPRESSION: 1.  Questionable mild expansion of the superior sagittal sinus without associated hyperdensity.  This might be related to intracranial hypotension in this setting, although other basilar cisterns and venous structures appear stable. 2.  Otherwise stable and normal noncontrast CT appearance of the brain.   Original Report Authenticated By: Harley Hallmark, M.D.    Mr Knee Right W Wo Contrast  02/19/2012  *RADIOLOGY REPORT*  Clinical Data: Persistent fever.  Recent open knee injury.  MRI OF THE RIGHT KNEE WITHOUT AND WITH CONTRAST  Technique:  Multiplanar, multisequence MR imaging was performed both before and after administration of intravenous contrast.  Contrast:  20 ml Multihance  Comparison: None.  Findings: There is an abscess in the subcutaneous soft tissues at the site of the penetrating wound  to the distal vastus medialis and medial patellofemoral ligament.  The abscess measures 2 x 1.5 x 2 cm and extends through the vastus medialis into the suprapatellar fat within the joint.  There is a tiny amount of pus within the fat pad.  There is no inflammation of the synovium of the joint.  There is no joint effusion.  There is a focal bone contusion of the anterior medial aspect of the medial femoral condyle.  There is no internal derangement of the knee.  Menisci and cruciate and collateral ligaments are normal.  Patellar tendon and quadriceps tendon are normal.  IMPRESSION:  1.  Soft tissue abscess along the tract of the penetrating injury through the distal vastus medialis.  This extends into the suprapatellar  fat within the joint with a tiny amount of pus in this fat pad.  No other evidence of infection within the joint. 2.  Bone contusion of the anterior medial aspect of the medial femoral condyle.   Original Report Authenticated By: Gwynn Burly, M.D.     Scheduled  Meds:    . citalopram  20 mg Oral Daily  . fluticasone  2 spray Each Nare Daily  . pantoprazole  40 mg Oral Q1200  . saccharomyces boulardii  250 mg Oral BID  . vancomycin  1,250 mg Intravenous Q8H   Continuous Infusions:    Principal Problem:  *Fever Active Problems:  Diarrhea  Headache  Chronic back pain    Time spent: 35 minutes    [facility]  [facility] Pager [phone]. If 8PM-8AM, please contact night-coverage at [email or web address], password [email or web address] 02/19/2012, 11:49 AM  LOS: 5 days
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
## 2012-02-20 DIAGNOSIS — G8929 Other chronic pain: Secondary | ICD-10-CM | POA: Diagnosis not present

## 2012-02-20 DIAGNOSIS — R05 Cough: Secondary | ICD-10-CM

## 2012-02-20 DIAGNOSIS — R509 Fever, unspecified: Secondary | ICD-10-CM | POA: Diagnosis not present

## 2012-02-20 DIAGNOSIS — M549 Dorsalgia, unspecified: Secondary | ICD-10-CM | POA: Diagnosis not present

## 2012-02-20 MED ORDER — CIPROFLOXACIN HCL 500 MG PO TABS
500.0000 mg | ORAL_TABLET | Freq: Two times a day (BID) | ORAL | Status: DC
  Administered 2012-02-20: 500 mg via ORAL
  Filled 2012-02-20 (×3): qty 1

## 2012-02-20 MED ORDER — SACCHAROMYCES BOULARDII 250 MG PO CAPS: 250.0000 mg | ORAL_CAPSULE | Freq: Two times a day (BID) | ORAL | Status: DC

## 2012-02-20 MED ORDER — DOXYCYCLINE HYCLATE 100 MG PO TABS: 100.0000 mg | ORAL_TABLET | Freq: Two times a day (BID) | ORAL | Status: DC

## 2012-02-20 MED ORDER — VANCOMYCIN HCL 1000 MG IV SOLR
1500.0000 mg | Freq: Two times a day (BID) | INTRAVENOUS | Status: DC
  Filled 2012-02-20: qty 1500

## 2012-02-20 NOTE — Progress Notes (Signed)
INFECTIOUS DISEASE PROGRESS NOTE  ID: [name] is a 41 y.o. male with  Principal Problem:  *Fever Active Problems:  Diarrhea  Headache  Chronic back pain  Thigh abscess  Subjective: Headache better  Abtx:  Anti-infectives     Start     Dose/Rate Route Frequency Ordered Stop   02/18/12 0200   vancomycin (VANCOCIN) 1,250 mg in sodium chloride 0.9 % 250 mL IVPB        1,250 mg 125 mL/hr over 120 Minutes Intravenous Every 8 hours 02/17/12 1906     02/16/12 0600   vancomycin (VANCOCIN) IVPB 1000 mg/200 mL premix  Status:  Discontinued        1,000 mg 100 mL/hr over 120 Minutes Intravenous Every 12 hours 02/15/12 1737 02/17/12 1906   02/15/12 1800   piperacillin-tazobactam (ZOSYN) IVPB 3.375 g  Status:  Discontinued        3.375 g 12.5 mL/hr over 240 Minutes Intravenous 3 times per day 02/15/12 1737 02/16/12 1427   02/15/12 1745   vancomycin (VANCOCIN) 2,000 mg in sodium chloride 0.9 % 500 mL IVPB        2,000 mg 250 mL/hr over 120 Minutes Intravenous  Once 02/15/12 1737 02/15/12 2059   02/15/12 1400   metroNIDAZOLE (FLAGYL) tablet 500 mg  Status:  Discontinued        500 mg Oral 3 times per day 02/15/12 1020 02/15/12 1728   02/15/12 0600   doxycycline (VIBRAMYCIN) 100 mg in dextrose 5 % 250 mL IVPB  Status:  Discontinued        100 mg 125 mL/hr over 120 Minutes Intravenous  Once 02/15/12 0554 02/15/12 0658          Medications:  Scheduled:   . citalopram  20 mg Oral Daily  . fluticasone  2 spray Each Nare Daily  . pantoprazole  40 mg Oral Q1200  . saccharomyces boulardii  250 mg Oral BID  . vancomycin  1,250 mg Intravenous Q8H    Objective: Vital signs in last 24 hours: Temp:  [98.4 F (36.9 C)-99.2 F (37.3 C)] 98.5 F (36.9 C) (09/25 0600) Pulse Rate:  [56-66] 56  (09/25 0600) Resp:  [18] 18  (09/25 0600) BP: (120-132)/(73-75) 120/73 mmHg (09/25 0600) SpO2:  [93 %-98 %] 93 % (09/25 0600)   General appearance: alert, cooperative and no  distress Resp: clear to auscultation bilaterally Cardio: regular rate and rhythm GI: normal findings: bowel sounds normal and soft, non-tender Extremities: R knee warm, mild swelling. Coridis on L wrist.   Lab Results  Basename 02/19/12 0617  WBC 6.1  HGB 13.2  HCT 39.0  NA 143  K 4.0  CL 105  CO2 30  BUN 13  CREATININE 0.97  GLU --   Liver Panel No results found for this basename: PROT:2,ALBUMIN:2,AST:2,ALT:2,ALKPHOS:2,BILITOT:2,BILIDIR:2,IBILI:2 in the last 72 hours Sedimentation Rate No results found for this basename: ESRSEDRATE in the last 72 hours C-Reactive Protein No results found for this basename: CRP:2 in the last 72 hours  Microbiology: Recent Results (from the past 240 hour(s))  CSF CULTURE     Status: Normal   Collection Time   02/15/12 12:44 AM      Component Value Range Status Comment   Specimen Description CSF   Final    Special Requests 1CC   Final    Gram Stain     Final    Value: WBC PRESENT, PREDOMINANTLY MONONUCLEAR     NO ORGANISMS SEEN  Performed at [facility]   Culture NO GROWTH 3 DAYS   Final    Report Status 02/18/2012 FINAL   Final   GRAM STAIN     Status: Normal   Collection Time   02/15/12 12:45 AM      Component Value Range Status Comment   Specimen Description CSF   Final    Special Requests 1CC   Final    Gram Stain     Final    Value: CYTOSPIN SLIDE     WBC PRESENT, PREDOMINANTLY MONONUCLEAR     NO ORGANISMS SEEN   Report Status 02/15/2012 FINAL   Final   CULTURE, BLOOD (ROUTINE X 2)     Status: Normal (Preliminary result)   Collection Time   02/15/12  5:40 AM      Component Value Range Status Comment   Specimen Description BLOOD RIGHT ARM   Final    Special Requests BOTTLES DRAWN AEROBIC AND ANAEROBIC 10CC   Final    Culture  Setup Time 02/15/2012 09:27   Final    Culture     Final    Value:        BLOOD CULTURE RECEIVED NO GROWTH TO DATE CULTURE WILL BE HELD FOR 5 DAYS BEFORE ISSUING A FINAL NEGATIVE REPORT    Report Status PENDING   Incomplete   CULTURE, BLOOD (ROUTINE X 2)     Status: Normal (Preliminary result)   Collection Time   02/15/12  5:50 AM      Component Value Range Status Comment   Specimen Description BLOOD RIGHT HAND   Final    Special Requests BOTTLES DRAWN AEROBIC AND ANAEROBIC 10CC   Final    Culture  Setup Time 02/15/2012 09:27   Final    Culture     Final    Value:        BLOOD CULTURE RECEIVED NO GROWTH TO DATE CULTURE WILL BE HELD FOR 5 DAYS BEFORE ISSUING A FINAL NEGATIVE REPORT   Report Status PENDING   Incomplete   CLOSTRIDIUM DIFFICILE BY PCR     Status: Normal   Collection Time   02/15/12 10:46 AM      Component Value Range Status Comment   C difficile by pcr NEGATIVE  NEGATIVE Final   STOOL CULTURE     Status: Normal   Collection Time   02/15/12 10:47 AM      Component Value Range Status Comment   Specimen Description STOOL   Final    Special Requests NONE   Final    Culture     Final    Value: NO SALMONELLA, SHIGELLA, CAMPYLOBACTER, YERSINIA, OR E.COLI 0157:H7 ISOLATED   Report Status 02/19/2012 FINAL   Final   URINE CULTURE     Status: Normal   Collection Time   02/15/12 12:00 PM      Component Value Range Status Comment   Specimen Description URINE, CLEAN CATCH   Final    Special Requests NONE   Final    Culture  Setup Time 02/15/2012 12:45   Final    Colony Count NO GROWTH   Final    Culture NO GROWTH   Final    Report Status 02/16/2012 FINAL   Final     Studies/Results: Ct Head Wo Contrast  02/18/2012  *RADIOLOGY REPORT*  Clinical Data: 41 year old male intractable headache.  Lumbar puncture 72 hours ago.  CT HEAD WITHOUT CONTRAST  Technique:  Contiguous axial images were obtained from the base  of the skull through the vertex without contrast.  Comparison: 01/15/2009.  Findings: Visualized orbits and scalp soft tissues are within normal limits.  Visualized paranasal sinuses and mastoids are clear.  No acute osseous abnormality identified.  Stable cerebral  volume.  No ventriculomegaly.  Basilar cisterns appear stable. Gray-white matter differentiation is within normal limits throughout the brain.  No midline shift, mass effect, or evidence of mass lesion.  No acute intracranial hemorrhage identified.  No evidence of cortically based acute infarction identified.  The superior sagittal sinus at the vertex appears larger than on prior studies, but not more dense than the other intracranial vasculature.  IMPRESSION: 1.  Questionable mild expansion of the superior sagittal sinus without associated hyperdensity.  This might be related to intracranial hypotension in this setting, although other basilar cisterns and venous structures appear stable. 2.  Otherwise stable and normal noncontrast CT appearance of the brain.   Original Report Authenticated By: Harley Hallmark, M.D.    Mr Knee Right W Wo Contrast  02/19/2012  *RADIOLOGY REPORT*  Clinical Data: Persistent fever.  Recent open knee injury.  MRI OF THE RIGHT KNEE WITHOUT AND WITH CONTRAST  Technique:  Multiplanar, multisequence MR imaging was performed both before and after administration of intravenous contrast.  Contrast:  20 ml Multihance  Comparison: None.  Findings: There is an abscess in the subcutaneous soft tissues at the site of the penetrating wound  to the distal vastus medialis and medial patellofemoral ligament.  The abscess measures 2 x 1.5 x 2 cm and extends through the vastus medialis into the suprapatellar fat within the joint.  There is a tiny amount of pus within the fat pad.  There is no inflammation of the synovium of the joint.  There is no joint effusion.  There is a focal bone contusion of the anterior medial aspect of the medial femoral condyle.  There is no internal derangement of the knee.  Menisci and cruciate and collateral ligaments are normal.  Patellar tendon and quadriceps tendon are normal.  IMPRESSION:  1.  Soft tissue abscess along the tract of the penetrating injury through the  distal vastus medialis.  This extends into the suprapatellar fat within the joint with a tiny amount of pus in this fat pad.  No other evidence of infection within the joint. 2.  Bone contusion of the anterior medial aspect of the medial femoral condyle.   Original Report Authenticated By: Gwynn Burly, M.D.      Assessment/Plan: Fever R leg abscess L hand thrombophlebitis Total days of antibiotics 5 (vanco)  Would  Consider aspirate of abscess Add cipro po Appreciate Dr Dion Saucier seeing pt.  Primary to address care of L hand   [name] Infectious Diseases [phone] 02/20/2012, 8:46 AM   LOS: 6 days
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
## 2012-02-20 NOTE — Progress Notes (Signed)
CRITICAL VALUE ALERT  Critical value received: vanc level  Date of notification: 02/20/2012  Time of notification:  1100  Critical value read back:yes  Nurse who received alert:  Rosalie Doctor RN  MD notified (1st page):  1110  Time of first page:    MD notified (2nd page):  Time of second page:  Responding MD: Jerral Ralph  Time MD responded:  1110
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
## 2012-02-20 NOTE — Progress Notes (Signed)
[name] [id number] Discharge Data: 02/20/2012 4:06 PM Attending Provider: No att. providers found [id number] H, MD     [name] to be D/C'd Home per MD order.  Discussed with the patient the After Visit Summary and all questions fully answered. All IV's discontinued with no bleeding noted. All belongings returned to patient for patient to take home.   Last Vital Signs:  Blood pressure 120/73, pulse 56, temperature 98.5 F (36.9 C), temperature source Oral, resp. rate 18, height 5\' 9"  (1.753 m), weight 104.327 kg (230 lb), SpO2 93.00%.  Discharge Medication List   Medication List     As of 02/20/2012  4:06 PM    STOP taking these medications         acetaminophen 500 MG tablet   Commonly known as: TYLENOL      TAKE these medications         citalopram 20 MG tablet   Commonly known as: CELEXA   TAKE 1 TABLET BY MOUTH EVERY DAY      cyclobenzaprine 10 MG tablet   Commonly known as: FLEXERIL   Take 10 mg by mouth at bedtime as needed. For muscle spasm      doxycycline 100 MG tablet   Commonly known as: VIBRA-TABS   Take 1 tablet (100 mg total) by mouth 2 (two) times daily.      fluticasone 50 MCG/ACT nasal spray   Commonly known as: FLONASE   Place 2 sprays into the nose daily.      ibuprofen 800 MG tablet   Commonly known as: ADVIL,MOTRIN   Take 1 tablet (800 mg total) by mouth every 8 (eight) hours as needed for pain.      omeprazole 20 MG capsule   Commonly known as: PRILOSEC   Take 20 mg by mouth daily.      oxyCODONE-acetaminophen 5-325 MG per tablet   Commonly known as: PERCOCET/ROXICET   Take 1 tablet by mouth every 6 (six) hours as needed.      saccharomyces boulardii 250 MG capsule   Commonly known as: FLORASTOR   Take 1 capsule (250 mg total) by mouth 2 (two) times daily.        Rosalie Doctor, RN

## 2012-02-21 ENCOUNTER — Telehealth: Payer: Self-pay | Admitting: Family Medicine

## 2012-02-21 ENCOUNTER — Emergency Department (HOSPITAL_COMMUNITY)
Admission: EM | Admit: 2012-02-21 | Discharge: 2012-02-21 | Disposition: A | Discharge status: Home / Self Care | Payer: 59 | Attending: Emergency Medicine | Admitting: Emergency Medicine

## 2012-02-21 ENCOUNTER — Encounter (HOSPITAL_COMMUNITY): Payer: Self-pay | Admitting: Emergency Medicine

## 2012-02-21 DIAGNOSIS — K589 Irritable bowel syndrome without diarrhea: Secondary | ICD-10-CM | POA: Insufficient documentation

## 2012-02-21 DIAGNOSIS — G971 Other reaction to spinal and lumbar puncture: Secondary | ICD-10-CM | POA: Insufficient documentation

## 2012-02-21 DIAGNOSIS — Z87442 Personal history of urinary calculi: Secondary | ICD-10-CM | POA: Insufficient documentation

## 2012-02-21 DIAGNOSIS — R319 Hematuria, unspecified: Secondary | ICD-10-CM

## 2012-02-21 DIAGNOSIS — F411 Generalized anxiety disorder: Secondary | ICD-10-CM | POA: Insufficient documentation

## 2012-02-21 DIAGNOSIS — Y844 Aspiration of fluid as the cause of abnormal reaction of the patient, or of later complication, without mention of misadventure at the time of the procedure: Secondary | ICD-10-CM | POA: Insufficient documentation

## 2012-02-21 DIAGNOSIS — K219 Gastro-esophageal reflux disease without esophagitis: Secondary | ICD-10-CM | POA: Insufficient documentation

## 2012-02-21 DIAGNOSIS — Z87891 Personal history of nicotine dependence: Secondary | ICD-10-CM | POA: Insufficient documentation

## 2012-02-21 LAB — CBC WITH DIFFERENTIAL/PLATELET
HCT: 43.2 % (ref 39.0–52.0)
Hemoglobin: 15.1 g/dL (ref 13.0–17.0)
Lymphocytes Relative: 26 % (ref 12–46)
Lymphs Abs: 1.6 10*3/uL (ref 0.7–4.0)
Monocytes Absolute: 0.7 10*3/uL (ref 0.1–1.0)
Monocytes Relative: 13 % — ABNORMAL HIGH (ref 3–12)
Neutro Abs: 3.6 10*3/uL (ref 1.7–7.7)
WBC: 5.9 10*3/uL (ref 4.0–10.5)

## 2012-02-21 LAB — COMPREHENSIVE METABOLIC PANEL
AST: 77 U/L — ABNORMAL HIGH (ref 0–37)
BUN: 15 mg/dL (ref 6–23)
CO2: 24 mEq/L (ref 19–32)
Chloride: 101 mEq/L (ref 96–112)
Creatinine, Ser: 0.96 mg/dL (ref 0.50–1.35)
GFR calc non Af Amer: >90 mL/min (ref >90)
Glucose, Bld: 94 mg/dL (ref 70–99)
Total Bilirubin: 0.3 mg/dL (ref 0.3–1.2)

## 2012-02-21 LAB — URINE MICROSCOPIC-ADD ON

## 2012-02-21 LAB — URINALYSIS, ROUTINE W REFLEX MICROSCOPIC
Bilirubin Urine: NEGATIVE
Protein, ur: NEGATIVE mg/dL
Urobilinogen, UA: 0.2 mg/dL (ref 0.0–1.0)

## 2012-02-21 LAB — CULTURE, BLOOD (ROUTINE X 2): Culture: NO GROWTH

## 2012-02-21 MED ORDER — ONDANSETRON HCL 4 MG PO TABS: 4.0000 mg | ORAL_TABLET | Freq: Three times a day (TID) | ORAL | Status: DC | PRN

## 2012-02-21 MED ORDER — SODIUM CHLORIDE 0.9 % IV SOLN: Freq: Once | INTRAVENOUS | Status: DC

## 2012-02-21 MED ORDER — ONDANSETRON HCL 4 MG/2ML IJ SOLN
4.0000 mg | Freq: Once | INTRAMUSCULAR | Status: AC
  Administered 2012-02-21: 4 mg via INTRAVENOUS
  Filled 2012-02-21: qty 2

## 2012-02-21 NOTE — ED Notes (Signed)
Updated on wait times.  Advised to notify of any change in condition or needs.

## 2012-02-21 NOTE — Telephone Encounter (Signed)
FYI
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
## 2012-02-21 NOTE — ED Notes (Signed)
Pt reporting h/a after spinal tap some days ago. Headache worse with standing, reporting photophobia. Pt concerned about fevers of unknown origin. Consider workup for lyme's disease. Family concerned about possible tick exposure [place]. Pt is a x 4. Afebrile at this time.
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
## 2012-02-22 ENCOUNTER — Ambulatory Visit: Payer: 59 | Admitting: Family Medicine

## 2012-02-22 ENCOUNTER — Ambulatory Visit (INDEPENDENT_AMBULATORY_CARE_PROVIDER_SITE_OTHER): Payer: 59 | Admitting: Family Medicine

## 2012-02-22 ENCOUNTER — Other Ambulatory Visit: Payer: Self-pay | Admitting: Emergency Medicine

## 2012-02-22 ENCOUNTER — Encounter: Payer: Self-pay | Admitting: Family Medicine

## 2012-02-22 ENCOUNTER — Inpatient Hospital Stay
Admission: RE | Admit: 2012-02-22 | Discharge: 2012-02-22 | Payer: 59 | Source: Ambulatory Visit | Attending: Emergency Medicine | Admitting: Emergency Medicine

## 2012-02-22 VITALS — BP 142/84 | HR 102 | Temp 97.6°F | Ht 69.0 in | Wt 217.0 lb

## 2012-02-22 DIAGNOSIS — R509 Fever, unspecified: Secondary | ICD-10-CM | POA: Diagnosis not present

## 2012-02-22 DIAGNOSIS — IMO0002 Reserved for concepts with insufficient information to code with codable children: Secondary | ICD-10-CM

## 2012-02-22 DIAGNOSIS — G971 Other reaction to spinal and lumbar puncture: Secondary | ICD-10-CM

## 2012-02-22 DIAGNOSIS — T148XXA Other injury of unspecified body region, initial encounter: Secondary | ICD-10-CM | POA: Diagnosis not present

## 2012-02-22 MED ORDER — MUPIROCIN 2 % EX OINT: TOPICAL_OINTMENT | Freq: Three times a day (TID) | CUTANEOUS | Status: DC

## 2012-02-22 NOTE — Telephone Encounter (Signed)
Noted-PM 

## 2012-02-22 NOTE — ED Provider Notes (Signed)
Medical screening examination/treatment/procedure(s) were performed by non-physician practitioner and as supervising physician I was immediately available for consultation/collaboration.   Loren Racer, MD 02/22/12 [phone]

## 2012-02-22 NOTE — Progress Notes (Signed)
OFFICE NOTE  02/24/2012  CC:  Chief Complaint  Patient presents with  . Suture / Staple Removal    right leg     HPI: Patient is a 41 y.o. Caucasian male who is here for suture removal on top of right knee.   Was hospitalized recently, just after I saw him last and sewed up his dehisced right knee wound. Had fevers, headaches, diarrhea--all testing was unremarkable in hospital, including LP, urine clx, blood cultures, and blood counts.  Orthopedics and Infectious dz saw him in hosp.  C diff negative.  An MRI of right knee showed what was originally called an abcess by the radiologist, but the orthopedist thought this was more consistent with a hematoma and recommended no I &D.  Infectious disease recommended he be d/c'd home on doxycycline, which he has been taken faithfully.    He has been still having HA's that are clearly worse with sitting up and alost completely resolve with lying supine. He was evaluated in ED yesterday and determined to have a CSF leak and was scheduled for a blood patch today.  However, since he has been feeling much improved today he put off this appt until Monday (3 days). He is trying to cut back on the oxycodone he has been taking for all the pain. We discusse that since he has been on high doses for a couple of weeks he needs to ween down off the meds and not stop abruptly.    Pertinent PMH:  Past Medical History  Diagnosis Date  . Hiatal hernia   . GERD (gastroesophageal reflux disease)   . IBS (irritable bowel syndrome)   . Diverticulosis   . Neck pain, chronic   . Back pain, chronic   . Anxiety   . Kidney stones 2005  . Erosive esophagitis     Grade A  . COLONIC POLYPS, HYPERPLASTIC 08/05/2007    Qualifier: Diagnosis of  By: Misty Stanley CMA (AAMA), Marchelle Folks    . ESOPHAGITIS, HX OF 12/15/2007    Qualifier: Diagnosis of  By: Burnadette Pop  MD, Trisha Mangle      MEDS:  Facility-Administered Medications Prior to Visit  Medication Dose Route Frequency Provider  Last Rate Last Dose  . ondansetron (ZOFRAN) injection 4 mg  4 mg Intravenous Once Hannah Muthersbaugh, PA-C   4 mg at 02/21/12 1904  . 0.9 %  sodium chloride infusion   Intravenous Once Dierdre Forth, PA-C       Outpatient Prescriptions Prior to Visit  Medication Sig Dispense Refill  . citalopram (CELEXA) 20 MG tablet TAKE 1 TABLET BY MOUTH EVERY DAY  30 tablet  3  . cyclobenzaprine (FLEXERIL) 10 MG tablet Take 10 mg by mouth at bedtime as needed. For muscle spasm       . doxycycline (VIBRA-TABS) 100 MG tablet Take 1 tablet (100 mg total) by mouth 2 (two) times daily.  20 tablet  0  . fluticasone (FLONASE) 50 MCG/ACT nasal spray Place 2 sprays into the nose daily.  16 g  6  . ibuprofen (ADVIL,MOTRIN) 800 MG tablet Take 1 tablet (800 mg total) by mouth every 8 (eight) hours as needed for pain.  45 tablet  1  . omeprazole (PRILOSEC) 20 MG capsule Take 20 mg by mouth daily.      . ondansetron (ZOFRAN) 4 MG tablet Take 1 tablet (4 mg total) by mouth every 8 (eight) hours as needed for nausea.  10 tablet  0  . oxyCODONE-acetaminophen (PERCOCET/ROXICET) 5-325 MG per tablet  Take 1 tablet by mouth every 6 (six) hours as needed. For pain      . saccharomyces boulardii (FLORASTOR) 250 MG capsule Take 1 capsule (250 mg total) by mouth 2 (two) times daily.  30 capsule  0    PE: Blood pressure 142/84, pulse 102, temperature 97.6 F (36.4 C), temperature source Temporal, height 5\' 9"  (1.753 m), weight 217 lb (98.431 kg). Gen: Alert, tired- appearing but smiling and in NAD.  Patient is oriented to person, place, time, and situation. AFFECT: pleasant, lucid thought and speech. Right knee laceration with pinkish hue where the wound edges approximate.  NO erythema, no tenderness, no induration, no fluctuance, no exudate from wound.   Removed 5 sutures today w/out problem.  Pt tolerated procedure well.  Wound edges remained well approximated. Right ankle with 1-2 cm oval scab with a small area of  ulceration that has not scabbed over.  NO tenderness, no foul odor, no discharge, no erythema or warmth.  IMPRESSION AND PLAN:  Laceration Looks great today. Sutures removed w/out problem, wound edges remained well approximated, steri strips placed.  Family instructed on further placement of steristrips over the next week or two to give the wound a bit extra support, esp since it is in an area that bends and can pull the edges apart. Fortunately, there is no sign of infection in the area of the laceration. I took some steri strips off of a smaller oval superficial ulceration lower down on the ankle.  This area was crusted over with scab except for one small region.  I placed a new bandaid on it and recommended they apply bactroban tid x 10d to the open/moist area until it heals to the point of a dry scab.    Acute febrile illness Unexplained.  Presumably viral syndrome (antibiotic induced diarrhea?). Now with spinal fluid leak headache.  He is improving from this and may still get blood patch by interventional radiology in 3d. He looks much better today.     FOLLOW UP:  2 wks

## 2012-02-23 ENCOUNTER — Inpatient Hospital Stay (HOSPITAL_COMMUNITY)
Admission: EM | Admit: 2012-02-23 | Discharge: 2012-02-27 | DRG: 176 | Disposition: A | Discharge status: Home / Self Care | Payer: 59 | Attending: Internal Medicine | Admitting: Internal Medicine

## 2012-02-23 ENCOUNTER — Emergency Department (HOSPITAL_COMMUNITY): Payer: 59

## 2012-02-23 DIAGNOSIS — M5412 Radiculopathy, cervical region: Secondary | ICD-10-CM

## 2012-02-23 DIAGNOSIS — K449 Diaphragmatic hernia without obstruction or gangrene: Secondary | ICD-10-CM | POA: Diagnosis present

## 2012-02-23 DIAGNOSIS — R042 Hemoptysis: Secondary | ICD-10-CM | POA: Diagnosis present

## 2012-02-23 DIAGNOSIS — I2699 Other pulmonary embolism without acute cor pulmonale: Principal | ICD-10-CM | POA: Diagnosis present

## 2012-02-23 DIAGNOSIS — M549 Dorsalgia, unspecified: Secondary | ICD-10-CM

## 2012-02-23 DIAGNOSIS — F411 Generalized anxiety disorder: Secondary | ICD-10-CM | POA: Diagnosis present

## 2012-02-23 DIAGNOSIS — R05 Cough: Secondary | ICD-10-CM

## 2012-02-23 DIAGNOSIS — K589 Irritable bowel syndrome without diarrhea: Secondary | ICD-10-CM | POA: Diagnosis present

## 2012-02-23 DIAGNOSIS — M1990 Unspecified osteoarthritis, unspecified site: Secondary | ICD-10-CM

## 2012-02-23 DIAGNOSIS — Y844 Aspiration of fluid as the cause of abnormal reaction of the patient, or of later complication, without mention of misadventure at the time of the procedure: Secondary | ICD-10-CM | POA: Diagnosis present

## 2012-02-23 DIAGNOSIS — E669 Obesity, unspecified: Secondary | ICD-10-CM

## 2012-02-23 DIAGNOSIS — G971 Other reaction to spinal and lumbar puncture: Secondary | ICD-10-CM | POA: Diagnosis present

## 2012-02-23 DIAGNOSIS — K219 Gastro-esophageal reflux disease without esophagitis: Secondary | ICD-10-CM | POA: Diagnosis present

## 2012-02-23 DIAGNOSIS — Z79899 Other long term (current) drug therapy: Secondary | ICD-10-CM

## 2012-02-23 DIAGNOSIS — R51 Headache: Secondary | ICD-10-CM

## 2012-02-23 DIAGNOSIS — IMO0002 Reserved for concepts with insufficient information to code with codable children: Secondary | ICD-10-CM

## 2012-02-23 DIAGNOSIS — S46912A Strain of unspecified muscle, fascia and tendon at shoulder and upper arm level, left arm, initial encounter: Secondary | ICD-10-CM

## 2012-02-23 DIAGNOSIS — L02419 Cutaneous abscess of limb, unspecified: Secondary | ICD-10-CM

## 2012-02-23 DIAGNOSIS — R509 Fever, unspecified: Secondary | ICD-10-CM

## 2012-02-23 DIAGNOSIS — K5730 Diverticulosis of large intestine without perforation or abscess without bleeding: Secondary | ICD-10-CM

## 2012-02-23 DIAGNOSIS — Z87891 Personal history of nicotine dependence: Secondary | ICD-10-CM

## 2012-02-23 DIAGNOSIS — R197 Diarrhea, unspecified: Secondary | ICD-10-CM

## 2012-02-23 LAB — CBC
MCH: 29.6 pg (ref 26.0–34.0)
Platelets: 209 10*3/uL (ref 150–400)
RBC: 4.96 MIL/uL (ref 4.22–5.81)
WBC: 9.7 10*3/uL (ref 4.0–10.5)

## 2012-02-23 LAB — BASIC METABOLIC PANEL
CO2: 24 mEq/L (ref 19–32)
Calcium: 10.5 mg/dL (ref 8.4–10.5)
GFR calc non Af Amer: 86 mL/min — ABNORMAL LOW (ref >90)
Sodium: 135 mEq/L (ref 135–145)

## 2012-02-23 LAB — POCT I-STAT TROPONIN I: Troponin i, poc: 0 ng/mL (ref 0.00–0.08)

## 2012-02-23 MED ORDER — NITROGLYCERIN 0.4 MG SL SUBL
0.4000 mg | SUBLINGUAL_TABLET | SUBLINGUAL | Status: DC | PRN
  Administered 2012-02-23: 0.4 mg via SUBLINGUAL
  Filled 2012-02-23: qty 75

## 2012-02-23 MED ORDER — ASPIRIN 325 MG PO TABS
325.0000 mg | ORAL_TABLET | ORAL | Status: AC
  Administered 2012-02-23: 325 mg via ORAL
  Filled 2012-02-23: qty 1

## 2012-02-23 MED ORDER — HEPARIN BOLUS VIA INFUSION
4000.0000 [IU] | Freq: Once | INTRAVENOUS | Status: AC
  Administered 2012-02-24: 4000 [IU] via INTRAVENOUS

## 2012-02-23 MED ORDER — HEPARIN (PORCINE) IN NACL 100-0.45 UNIT/ML-% IJ SOLN
14.0000 [IU]/kg/h | Freq: Once | INTRAMUSCULAR | Status: AC
  Administered 2012-02-24: 14 [IU]/kg/h via INTRAVENOUS
  Filled 2012-02-23: qty 250

## 2012-02-23 MED ORDER — IOHEXOL 350 MG/ML SOLN
80.0000 mL | Freq: Once | INTRAVENOUS | Status: AC | PRN
  Administered 2012-02-23: 80 mL via INTRAVENOUS

## 2012-02-23 NOTE — ED Notes (Addendum)
Reports chest pain that began last night described as pressure that is constant with intermittent sharp pains located in left chest radiates to left neck and left axilla associated with Dizziness and "hot flashes". Denies SOB, nausea. Lying down makes pain worse and deep inspiration makes pain worse. Chest is tender to palpation.
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
## 2012-02-23 NOTE — ED Notes (Signed)
Advised MD that the patient does not qualify for CDU because of active chest pain.

## 2012-02-23 NOTE — ED Notes (Signed)
Patient transported to CT 

## 2012-02-23 NOTE — ED Notes (Signed)
No change in amount of chest pain after 1st nitro.

## 2012-02-24 ENCOUNTER — Encounter (HOSPITAL_COMMUNITY): Payer: Self-pay | Admitting: Internal Medicine

## 2012-02-24 DIAGNOSIS — I2699 Other pulmonary embolism without acute cor pulmonale: Secondary | ICD-10-CM | POA: Diagnosis not present

## 2012-02-24 DIAGNOSIS — R197 Diarrhea, unspecified: Secondary | ICD-10-CM

## 2012-02-24 DIAGNOSIS — R509 Fever, unspecified: Secondary | ICD-10-CM | POA: Insufficient documentation

## 2012-02-24 LAB — COMPREHENSIVE METABOLIC PANEL
ALT: 78 U/L — ABNORMAL HIGH (ref 0–53)
AST: 16 U/L (ref 0–37)
Albumin: 3.7 g/dL (ref 3.5–5.2)
Alkaline Phosphatase: 63 U/L (ref 39–117)
CO2: 23 mEq/L (ref 19–32)
Chloride: 100 mEq/L (ref 96–112)
Creatinine, Ser: 0.92 mg/dL (ref 0.50–1.35)
GFR calc non Af Amer: >90 mL/min (ref >90)
Potassium: 3.8 mEq/L (ref 3.5–5.1)
Total Bilirubin: 0.4 mg/dL (ref 0.3–1.2)

## 2012-02-24 LAB — CBC
MCH: 29.7 pg (ref 26.0–34.0)
MCHC: 34.5 g/dL (ref 30.0–36.0)
MCV: 86 fL (ref 78.0–100.0)
Platelets: 188 10*3/uL (ref 150–400)
RDW: 12.9 % (ref 11.5–15.5)

## 2012-02-24 LAB — HEPARIN LEVEL (UNFRACTIONATED): Heparin Unfractionated: 0.14 IU/mL — ABNORMAL LOW (ref 0.30–0.70)

## 2012-02-24 LAB — PHOSPHORUS: Phosphorus: 3.7 mg/dL (ref 2.3–4.6)

## 2012-02-24 LAB — TSH: TSH: 1.54 u[IU]/mL (ref 0.350–4.500)

## 2012-02-24 LAB — HOMOCYSTEINE: Homocysteine: 8.9 umol/L (ref 4.0–15.4)

## 2012-02-24 MED ORDER — WARFARIN VIDEO: Freq: Once | Status: DC

## 2012-02-24 MED ORDER — HYDROCODONE-ACETAMINOPHEN 5-325 MG PO TABS: 1.0000 | ORAL_TABLET | ORAL | Status: DC | PRN

## 2012-02-24 MED ORDER — ACETAMINOPHEN 325 MG PO TABS
650.0000 mg | ORAL_TABLET | Freq: Four times a day (QID) | ORAL | Status: DC | PRN
  Administered 2012-02-24: 650 mg via ORAL
  Filled 2012-02-24: qty 2

## 2012-02-24 MED ORDER — ACETAMINOPHEN 650 MG RE SUPP: 650.0000 mg | Freq: Four times a day (QID) | RECTAL | Status: DC | PRN

## 2012-02-24 MED ORDER — SODIUM CHLORIDE 0.9 % IJ SOLN
3.0000 mL | Freq: Two times a day (BID) | INTRAMUSCULAR | Status: DC
  Administered 2012-02-24 – 2012-02-27 (×4): 3 mL via INTRAVENOUS

## 2012-02-24 MED ORDER — OXYCODONE-ACETAMINOPHEN 5-325 MG PO TABS: 1.0000 | ORAL_TABLET | Freq: Four times a day (QID) | ORAL | Status: DC | PRN

## 2012-02-24 MED ORDER — HEPARIN (PORCINE) IN NACL 100-0.45 UNIT/ML-% IJ SOLN
2250.0000 [IU]/h | INTRAMUSCULAR | Status: DC
  Administered 2012-02-24: 1400 [IU]/h via INTRAVENOUS
  Administered 2012-02-24: 1800 [IU]/h via INTRAVENOUS
  Administered 2012-02-24: 2150 [IU]/h via INTRAVENOUS
  Administered 2012-02-25: 2250 [IU]/h via INTRAVENOUS
  Filled 2012-02-24 (×8): qty 250

## 2012-02-24 MED ORDER — PANTOPRAZOLE SODIUM 40 MG PO TBEC
40.0000 mg | DELAYED_RELEASE_TABLET | Freq: Every day | ORAL | Status: DC
  Administered 2012-02-24 – 2012-02-26 (×3): 40 mg via ORAL
  Filled 2012-02-24 (×3): qty 1

## 2012-02-24 MED ORDER — MUPIROCIN 2 % EX OINT
TOPICAL_OINTMENT | Freq: Three times a day (TID) | CUTANEOUS | Status: DC
  Administered 2012-02-24 – 2012-02-26 (×7): via TOPICAL
  Filled 2012-02-24: qty 22

## 2012-02-24 MED ORDER — CYCLOBENZAPRINE HCL 10 MG PO TABS: 10.0000 mg | ORAL_TABLET | Freq: Every evening | ORAL | Status: DC | PRN

## 2012-02-24 MED ORDER — ONDANSETRON HCL 4 MG PO TABS: 4.0000 mg | ORAL_TABLET | Freq: Four times a day (QID) | ORAL | Status: DC | PRN

## 2012-02-24 MED ORDER — HEPARIN BOLUS VIA INFUSION
3000.0000 [IU] | Freq: Once | INTRAVENOUS | Status: AC
  Administered 2012-02-24: 3000 [IU] via INTRAVENOUS
  Filled 2012-02-24: qty 3000

## 2012-02-24 MED ORDER — ALUM & MAG HYDROXIDE-SIMETH 200-200-20 MG/5ML PO SUSP: 30.0000 mL | Freq: Four times a day (QID) | ORAL | Status: DC | PRN

## 2012-02-24 MED ORDER — SODIUM CHLORIDE 0.9 % IV SOLN
INTRAVENOUS | Status: AC
  Administered 2012-02-24 (×2): via INTRAVENOUS

## 2012-02-24 MED ORDER — GUAIFENESIN-DM 100-10 MG/5ML PO SYRP
5.0000 mL | ORAL_SOLUTION | ORAL | Status: DC | PRN
  Administered 2012-02-25 – 2012-02-26 (×2): 5 mL via ORAL
  Filled 2012-02-24 (×2): qty 5

## 2012-02-24 MED ORDER — COUMADIN BOOK
Freq: Once | Status: AC
  Administered 2012-02-24: 14:00:00
  Filled 2012-02-24: qty 1

## 2012-02-24 MED ORDER — ONDANSETRON HCL 4 MG/2ML IJ SOLN: 4.0000 mg | Freq: Three times a day (TID) | INTRAMUSCULAR | Status: DC | PRN

## 2012-02-24 MED ORDER — DOCUSATE SODIUM 100 MG PO CAPS
100.0000 mg | ORAL_CAPSULE | Freq: Two times a day (BID) | ORAL | Status: DC
  Filled 2012-02-24 (×8): qty 1

## 2012-02-24 MED ORDER — HYDROMORPHONE HCL PF 1 MG/ML IJ SOLN
1.0000 mg | INTRAMUSCULAR | Status: AC | PRN
  Administered 2012-02-24 (×2): 1 mg via INTRAVENOUS
  Filled 2012-02-24 (×2): qty 1

## 2012-02-24 MED ORDER — DOXYCYCLINE HYCLATE 100 MG PO TABS
100.0000 mg | ORAL_TABLET | Freq: Two times a day (BID) | ORAL | Status: DC
  Administered 2012-02-24 – 2012-02-27 (×7): 100 mg via ORAL
  Filled 2012-02-24 (×9): qty 1

## 2012-02-24 MED ORDER — FLUTICASONE PROPIONATE 50 MCG/ACT NA SUSP
2.0000 | Freq: Every day | NASAL | Status: DC
  Administered 2012-02-25 – 2012-02-27 (×3): 2 via NASAL
  Filled 2012-02-24: qty 16

## 2012-02-24 MED ORDER — WARFARIN SODIUM 10 MG PO TABS
10.0000 mg | ORAL_TABLET | Freq: Once | ORAL | Status: AC
  Administered 2012-02-24: 10 mg via ORAL
  Filled 2012-02-24 (×2): qty 1

## 2012-02-24 MED ORDER — HEPARIN BOLUS VIA INFUSION
2000.0000 [IU] | Freq: Once | INTRAVENOUS | Status: AC
  Administered 2012-02-24: 2000 [IU] via INTRAVENOUS
  Filled 2012-02-24: qty 2000

## 2012-02-24 MED ORDER — ALBUTEROL SULFATE (5 MG/ML) 0.5% IN NEBU: 2.5000 mg | INHALATION_SOLUTION | RESPIRATORY_TRACT | Status: DC | PRN

## 2012-02-24 MED ORDER — ONDANSETRON HCL 4 MG PO TABS: 4.0000 mg | ORAL_TABLET | Freq: Three times a day (TID) | ORAL | Status: DC | PRN

## 2012-02-24 MED ORDER — WARFARIN - PHARMACIST DOSING INPATIENT: Freq: Every day | Status: DC

## 2012-02-24 MED ORDER — CITALOPRAM HYDROBROMIDE 20 MG PO TABS
20.0000 mg | ORAL_TABLET | Freq: Every day | ORAL | Status: DC
  Administered 2012-02-24 – 2012-02-27 (×4): 20 mg via ORAL
  Filled 2012-02-24 (×4): qty 1

## 2012-02-24 MED ORDER — SACCHAROMYCES BOULARDII 250 MG PO CAPS
250.0000 mg | ORAL_CAPSULE | Freq: Two times a day (BID) | ORAL | Status: DC
  Administered 2012-02-24 – 2012-02-27 (×7): 250 mg via ORAL
  Filled 2012-02-24 (×8): qty 1

## 2012-02-24 MED ORDER — IBUPROFEN 800 MG PO TABS
800.0000 mg | ORAL_TABLET | Freq: Three times a day (TID) | ORAL | Status: DC | PRN
  Administered 2012-02-24: 800 mg via ORAL
  Filled 2012-02-24: qty 1

## 2012-02-24 MED ORDER — ONDANSETRON HCL 4 MG/2ML IJ SOLN
4.0000 mg | Freq: Four times a day (QID) | INTRAMUSCULAR | Status: DC | PRN
  Administered 2012-02-24 – 2012-02-27 (×5): 4 mg via INTRAVENOUS
  Filled 2012-02-24 (×5): qty 2

## 2012-02-24 NOTE — Assessment & Plan Note (Signed)
Looks great today. Sutures removed w/out problem, wound edges remained well approximated, steri strips placed.  Family instructed on further placement of steristrips over the next week or two to give the wound a bit extra support, esp since it is in an area that bends and can pull the edges apart. Fortunately, there is no sign of infection in the area of the laceration. I took some steri strips off of a smaller oval superficial ulceration lower down on the ankle.  This area was crusted over with scab except for one small region.  I placed a new bandaid on it and recommended they apply bactroban tid x 10d to the open/moist area until it heals to the point of a dry scab.
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
## 2012-02-24 NOTE — ED Notes (Signed)
Called pharmacy to request delivery of heparin to pod A.

## 2012-02-24 NOTE — ED Notes (Signed)
MD at bedside. 

## 2012-02-24 NOTE — Progress Notes (Signed)
  Echocardiogram 2D Echocardiogram has been performed.  [place] 02/24/2012, 10:43 AM

## 2012-02-24 NOTE — Progress Notes (Signed)
ANTICOAGULATION CONSULT NOTE - Follow Up Consult  Pharmacy Consult for heparin and warfarin Indication: pulmonary embolus  Allergies  Allergen Reactions  . Vancomycin     "red man syndrome"  . Clarithromycin     REACTION: nausea and tremor  . Gabapentin     REACTION: nausea, dizziness    Patient Measurements: Height: 5\' 9"  (175.3 cm) Weight: 216 lb 4.3 oz (98.1 kg) IBW/kg (Calculated) : 70.7   Vital Signs: Temp: 98.8 F (37.1 C) (09/29 0528) Temp src: Oral (09/29 0528) BP: 126/83 mmHg (09/29 0528) Pulse Rate: 81  (09/29 0528)  Labs:  Basename 02/24/12 0535 02/23/12 2120 02/23/12 1742 02/21/12 1752  HGB 14.0 -- 14.7 --  HCT 40.6 -- 42.5 43.2  PLT 188 -- 209 299  APTT -- 34 -- --  LABPROT -- 13.2 -- --  INR -- 1.01 -- --  HEPARINUNFRC 0.14* -- -- --  CREATININE 0.92 -- 1.07 0.96  CKTOTAL -- -- -- --  CKMB -- -- -- --  TROPONINI -- -- -- --    Estimated Creatinine Clearance: 125.8 ml/min (by C-G formula based on Cr of 0.92).   Medical History: Past Medical History  Diagnosis Date  . Hiatal hernia   . GERD (gastroesophageal reflux disease)   . IBS (irritable bowel syndrome)   . Diverticulosis   . Neck pain, chronic   . Back pain, chronic   . Anxiety   . Kidney stones 2005  . Erosive esophagitis     Grade A  . COLONIC POLYPS, HYPERPLASTIC 08/05/2007    Qualifier: Diagnosis of  By: Misty Stanley CMA (AAMA), Marchelle Folks    . ESOPHAGITIS, HX OF 12/15/2007    Qualifier: Diagnosis of  By: Burnadette Pop  MD, Trisha Mangle      Medications:  Prescriptions prior to admission  Medication Sig Dispense Refill  . citalopram (CELEXA) 20 MG tablet TAKE 1 TABLET BY MOUTH EVERY DAY  30 tablet  3  . cyclobenzaprine (FLEXERIL) 10 MG tablet Take 10 mg by mouth at bedtime as needed. For muscle spasm       . doxycycline (VIBRA-TABS) 100 MG tablet Take 1 tablet (100 mg total) by mouth 2 (two) times daily.  20 tablet  0  . fluticasone (FLONASE) 50 MCG/ACT nasal spray Place 2 sprays into the nose  daily.  16 g  6  . ibuprofen (ADVIL,MOTRIN) 800 MG tablet Take 1 tablet (800 mg total) by mouth every 8 (eight) hours as needed for pain.  45 tablet  1  . mupirocin ointment (BACTROBAN) 2 % Apply topically 3 (three) times daily.  15 g  0  . omeprazole (PRILOSEC) 20 MG capsule Take 20 mg by mouth daily.      . ondansetron (ZOFRAN) 4 MG tablet Take 1 tablet (4 mg total) by mouth every 8 (eight) hours as needed for nausea.  10 tablet  0  . oxyCODONE-acetaminophen (PERCOCET/ROXICET) 5-325 MG per tablet Take 1 tablet by mouth every 6 (six) hours as needed. For pain      . saccharomyces boulardii (FLORASTOR) 250 MG capsule Take 1 capsule (250 mg total) by mouth 2 (two) times daily.  30 capsule  0   Scheduled:     . aspirin  325 mg Oral STAT  . citalopram  20 mg Oral Daily  . coumadin book   Does not apply Once  . docusate sodium  100 mg Oral BID  . doxycycline  100 mg Oral BID  . fluticasone  2 spray Each Nare Daily  .  heparin  14 Units/kg/hr Intravenous Once  . heparin  2,000 Units Intravenous Once  . heparin  4,000 Units Intravenous Once  . mupirocin ointment   Topical TID  . pantoprazole  40 mg Oral Q1200  . saccharomyces boulardii  250 mg Oral BID  . sodium chloride  3 mL Intravenous Q12H  . warfarin  10 mg Oral ONCE-1800  . warfarin   Does not apply Once  . Warfarin - Pharmacist Dosing Inpatient   Does not apply q1800    Assessment: 41yo male admitted with  CP 02/23/2012,CT shows bilateral PE with moderate to severe clot burden, awaiting results for hypercoagulable workup, pharmacy consulted to dose heparin and warfarin.  Anticoagulation: PE: heparin infusing without problems at 1400 units/hr, no bleeding noted, heparin level reported < goal.  Goal of Therapy:  INR 2-3 Heparin level 0.3-0.7 units/ml Monitor platelets by anticoagulation protocol: Yes   Plan:  1. Heparin 2000 units x 1 then increase to 1800 units/hr 2. Check heparin level in 6h 3. Daily heparin level, CBC 4.   Begin Coumadin education.   Thank you for allowing pharmacy to be a part of this patients care team.  Lovenia Kim Pharm.D., BCPS Clinical Pharmacist 02/24/2012 7:09 AM Pager: [phone] Phone: [phone]

## 2012-02-24 NOTE — Progress Notes (Signed)
Pt complains of severe headache and nausea. Medications given with no relief, See MAR. Pt additionally given cold pack. Pt refuses Percocet and Vicodin at this time. MD paged, awaiting call back. Will continue to monitor patient.

## 2012-02-24 NOTE — Progress Notes (Signed)
ANTICOAGULATION CONSULT NOTE - Follow Up Consult  Pharmacy Consult for heparin and warfarin Indication: pulmonary embolus  Allergies  Allergen Reactions  . Vancomycin     "red man syndrome"  . Clarithromycin     REACTION: nausea and tremor  . Gabapentin     REACTION: nausea, dizziness    Patient Measurements: Height: 5\' 9"  (175.3 cm) Weight: 216 lb 4.3 oz (98.1 kg) IBW/kg (Calculated) : 70.7  Heparin dosing weight 91.3 kg  Vital Signs: Temp: 98.8 F (37.1 C) (09/29 0528) Temp src: Oral (09/29 0528) BP: 126/83 mmHg (09/29 0528) Pulse Rate: 81  (09/29 0528)  Labs:  Basename 02/24/12 1313 02/24/12 0535 02/23/12 2120 02/23/12 1742 02/21/12 1752  HGB -- 14.0 -- 14.7 --  HCT -- 40.6 -- 42.5 43.2  PLT -- 188 -- 209 299  APTT -- -- 34 -- --  LABPROT -- -- 13.2 -- --  INR -- -- 1.01 -- --  HEPARINUNFRC <0.10* 0.14* -- -- --  CREATININE -- 0.92 -- 1.07 0.96  CKTOTAL -- -- -- -- --  CKMB -- -- -- -- --  TROPONINI -- -- -- -- --    Estimated Creatinine Clearance: 125.8 ml/min (by C-G formula based on Cr of 0.92).   Medical History: Past Medical History  Diagnosis Date  . Hiatal hernia   . GERD (gastroesophageal reflux disease)   . IBS (irritable bowel syndrome)   . Diverticulosis   . Neck pain, chronic   . Back pain, chronic   . Anxiety   . Kidney stones 2005  . Erosive esophagitis     Grade A  . COLONIC POLYPS, HYPERPLASTIC 08/05/2007    Qualifier: Diagnosis of  By: Misty Stanley CMA (AAMA), Marchelle Folks    . ESOPHAGITIS, HX OF 12/15/2007    Qualifier: Diagnosis of  By: Burnadette Pop  MD, Trisha Mangle      Medications:     . sodium chloride 125 mL/hr at 02/24/12 0941  . heparin 1,800 Units/hr (02/24/12 1239)       Assessment: Bilateral PE:  Heparin level remains low despite rate adjustments.  Coumadin is to start this evening.  Goal of Therapy:  INR 2-3 Heparin level 0.3-0.7 units/ml Monitor platelets by anticoagulation protocol: Yes   Plan:  1. Heparin 3000 units x  1 then increase to 2150 units/hr 2. Check heparin level in 6h 3. Coumadin 10mg  today 4. Daily PT/INR  Estella Husk, Pharm.D., BCPS Clinical Pharmacist  Phone [phone] Pager [phone] 02/24/2012, 3:24 PM

## 2012-02-24 NOTE — Progress Notes (Signed)
ANTICOAGULATION CONSULT NOTE - Initial Consult  Pharmacy Consult for heparin and warfarin Indication: pulmonary embolus  Allergies  Allergen Reactions  . Vancomycin     "red man syndrome"  . Clarithromycin     REACTION: nausea and tremor  . Gabapentin     REACTION: nausea, dizziness    Patient Measurements: Height: 5\' 9"  (175.3 cm) Weight: 216 lb 4.3 oz (98.1 kg) IBW/kg (Calculated) : 70.7   Vital Signs: Temp: 98.8 F (37.1 C) (09/29 0146) Temp src: Oral (09/29 0146) BP: 131/79 mmHg (09/29 0146) Pulse Rate: 90  (09/29 0146)  Labs:  Basename 02/23/12 2120 02/23/12 1742 02/21/12 1752  HGB -- 14.7 15.1  HCT -- 42.5 43.2  PLT -- 209 299  APTT 34 -- --  LABPROT 13.2 -- --  INR 1.01 -- --  HEPARINUNFRC -- -- --  CREATININE -- 1.07 0.96  CKTOTAL -- -- --  CKMB -- -- --  TROPONINI -- -- --    Estimated Creatinine Clearance: 108.2 ml/min (by C-G formula based on Cr of 1.07).   Medical History: Past Medical History  Diagnosis Date  . Hiatal hernia   . GERD (gastroesophageal reflux disease)   . IBS (irritable bowel syndrome)   . Diverticulosis   . Neck pain, chronic   . Back pain, chronic   . Anxiety   . Kidney stones 2005  . Erosive esophagitis     Grade A  . COLONIC POLYPS, HYPERPLASTIC 08/05/2007    Qualifier: Diagnosis of  By: Misty Stanley CMA (AAMA), Marchelle Folks    . ESOPHAGITIS, HX OF 12/15/2007    Qualifier: Diagnosis of  By: Burnadette Pop  MD, Trisha Mangle      Medications:  Prescriptions prior to admission  Medication Sig Dispense Refill  . citalopram (CELEXA) 20 MG tablet TAKE 1 TABLET BY MOUTH EVERY DAY  30 tablet  3  . cyclobenzaprine (FLEXERIL) 10 MG tablet Take 10 mg by mouth at bedtime as needed. For muscle spasm       . doxycycline (VIBRA-TABS) 100 MG tablet Take 1 tablet (100 mg total) by mouth 2 (two) times daily.  20 tablet  0  . fluticasone (FLONASE) 50 MCG/ACT nasal spray Place 2 sprays into the nose daily.  16 g  6  . ibuprofen (ADVIL,MOTRIN) 800 MG  tablet Take 1 tablet (800 mg total) by mouth every 8 (eight) hours as needed for pain.  45 tablet  1  . mupirocin ointment (BACTROBAN) 2 % Apply topically 3 (three) times daily.  15 g  0  . omeprazole (PRILOSEC) 20 MG capsule Take 20 mg by mouth daily.      . ondansetron (ZOFRAN) 4 MG tablet Take 1 tablet (4 mg total) by mouth every 8 (eight) hours as needed for nausea.  10 tablet  0  . oxyCODONE-acetaminophen (PERCOCET/ROXICET) 5-325 MG per tablet Take 1 tablet by mouth every 6 (six) hours as needed. For pain      . saccharomyces boulardii (FLORASTOR) 250 MG capsule Take 1 capsule (250 mg total) by mouth 2 (two) times daily.  30 capsule  0   Scheduled:    . aspirin  325 mg Oral STAT  . citalopram  20 mg Oral Daily  . coumadin book   Does not apply Once  . docusate sodium  100 mg Oral BID  . doxycycline  100 mg Oral BID  . fluticasone  2 spray Each Nare Daily  . heparin  14 Units/kg/hr Intravenous Once  . heparin  4,000 Units Intravenous  Once  . mupirocin ointment   Topical TID  . pantoprazole  40 mg Oral Q1200  . saccharomyces boulardii  250 mg Oral BID  . sodium chloride  3 mL Intravenous Q12H  . warfarin  10 mg Oral ONCE-1800  . warfarin   Does not apply Once  . Warfarin - Pharmacist Dosing Inpatient   Does not apply q1800    Assessment: 41yo male c/o CP that started last night, worse with deep breath and lying on side, denies SOB, CT shows bilateral PE with moderate to severe clot burden, awaiting results for hypercoagulable workup, to begin anticoagulation.  Goal of Therapy:  INR 2-3 Heparin level 0.3-0.7 units/ml Monitor platelets by anticoagulation protocol: Yes   Plan:  EDMD gave heparin 4000 units IV bolus followed by gtt at 1400 units/hr; will continue at this rate and monitor heparin levels and CBC; will give Coumadin 10mg  po x1 today and monitor INR for dose adjustments; begin Coumadin education.  Colleen Can PharmD BCPS 02/24/2012,2:25 AM
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
## 2012-02-24 NOTE — Assessment & Plan Note (Signed)
Unexplained.  Presumably viral syndrome (antibiotic induced diarrhea?). Now with spinal fluid leak headache.  He is improving from this and may still get blood patch by interventional radiology in 3d. He looks much better today.

## 2012-02-24 NOTE — Progress Notes (Signed)
VASCULAR LAB PRELIMINARY  PRELIMINARY  PRELIMINARY  PRELIMINARY  Bilateral lower extremity venous duplex  completed.    Preliminary report:  Bilateral:  No evidence of DVT, superficial thrombosis, or Baker's Cyst.    Jessee Mezera,  RVT 02/24/2012, 3:42 PM

## 2012-02-25 ENCOUNTER — Ambulatory Visit: Payer: 59 | Admitting: Family Medicine

## 2012-02-25 DIAGNOSIS — R509 Fever, unspecified: Secondary | ICD-10-CM

## 2012-02-25 DIAGNOSIS — K219 Gastro-esophageal reflux disease without esophagitis: Secondary | ICD-10-CM

## 2012-02-25 DIAGNOSIS — F411 Generalized anxiety disorder: Secondary | ICD-10-CM

## 2012-02-25 DIAGNOSIS — M549 Dorsalgia, unspecified: Secondary | ICD-10-CM

## 2012-02-25 DIAGNOSIS — I2699 Other pulmonary embolism without acute cor pulmonale: Secondary | ICD-10-CM | POA: Diagnosis not present

## 2012-02-25 DIAGNOSIS — R05 Cough: Secondary | ICD-10-CM

## 2012-02-25 DIAGNOSIS — G8929 Other chronic pain: Secondary | ICD-10-CM

## 2012-02-25 LAB — CBC
HCT: 38.2 % — ABNORMAL LOW (ref 39.0–52.0)
Hemoglobin: 12.7 g/dL — ABNORMAL LOW (ref 13.0–17.0)
MCH: 29.2 pg (ref 26.0–34.0)
MCV: 87.8 fL (ref 78.0–100.0)
Platelets: 197 10*3/uL (ref 150–400)
RBC: 4.35 MIL/uL (ref 4.22–5.81)
WBC: 6.8 10*3/uL (ref 4.0–10.5)

## 2012-02-25 LAB — CARDIOLIPIN ANTIBODIES, IGG, IGM, IGA: Anticardiolipin IgG: 9 GPL U/mL — ABNORMAL LOW (ref <23)

## 2012-02-25 LAB — BETA-2-GLYCOPROTEIN I ABS, IGG/M/A: Beta-2-Glycoprotein I IgM: 8 M Units (ref <20)

## 2012-02-25 LAB — PROTEIN S, TOTAL: Protein S Ag, Total: 106 % (ref 60–150)

## 2012-02-25 LAB — FACTOR 5 LEIDEN

## 2012-02-25 LAB — LUPUS ANTICOAGULANT PANEL
PTT Lupus Anticoagulant: 44.6 secs — ABNORMAL HIGH (ref 28.0–43.0)
PTTLA Confirmation: 16.6 secs — ABNORMAL HIGH (ref <8.0)

## 2012-02-25 LAB — HEPARIN LEVEL (UNFRACTIONATED): Heparin Unfractionated: 0.31 IU/mL (ref 0.30–0.70)

## 2012-02-25 LAB — PROTEIN C, TOTAL: Protein C, Total: 91 % (ref 72–160)

## 2012-02-25 MED ORDER — HYDROCODONE-HOMATROPINE 5-1.5 MG/5ML PO SYRP
5.0000 mL | ORAL_SOLUTION | Freq: Three times a day (TID) | ORAL | Status: DC
  Administered 2012-02-25 – 2012-02-26 (×4): 5 mL via ORAL
  Filled 2012-02-25 (×4): qty 5

## 2012-02-25 MED ORDER — WARFARIN SODIUM 10 MG PO TABS
10.0000 mg | ORAL_TABLET | Freq: Once | ORAL | Status: AC
  Administered 2012-02-25: 10 mg via ORAL
  Filled 2012-02-25: qty 1

## 2012-02-25 NOTE — Progress Notes (Signed)
When Pt tried to stand up to take bath, he complained of nausea. IV zofran was given with relief. Will continue to monitor. Mkayla Steele, [place]

## 2012-02-25 NOTE — Progress Notes (Signed)
ANTICOAGULATION CONSULT NOTE - Follow Up Consult  Pharmacy Consult for heparin and warfarin Indication: pulmonary embolus  Allergies  Allergen Reactions  . Vancomycin     "red man syndrome"  . Clarithromycin     REACTION: nausea and tremor  . Gabapentin     REACTION: nausea, dizziness    Patient Measurements: Height: 5\' 9"  (175.3 cm) Weight: 216 lb 4.3 oz (98.1 kg) IBW/kg (Calculated) : 70.7  Heparin dosing weight 91.3 kg  Vital Signs: Temp: 98.5 F (36.9 C) (09/29 2039) Temp src: Oral (09/29 2039) BP: 128/85 mmHg (09/29 2039) Pulse Rate: 67  (09/29 2039)  Labs:  Basename 02/24/12 2305 02/24/12 1313 02/24/12 0535 02/23/12 2120 02/23/12 1742  HGB -- -- 14.0 -- 14.7  HCT -- -- 40.6 -- 42.5  PLT -- -- 188 -- 209  APTT -- -- -- 34 --  LABPROT -- -- -- 13.2 --  INR -- -- -- 1.01 --  HEPARINUNFRC 0.40 <0.10* 0.14* -- --  CREATININE -- -- 0.92 -- 1.07  CKTOTAL -- -- -- -- --  CKMB -- -- -- -- --  TROPONINI -- -- -- -- --    Estimated Creatinine Clearance: 125.8 ml/min (by C-G formula based on Cr of 0.92).   Medical History: Past Medical History  Diagnosis Date  . Hiatal hernia   . GERD (gastroesophageal reflux disease)   . IBS (irritable bowel syndrome)   . Diverticulosis   . Neck pain, chronic   . Back pain, chronic   . Anxiety   . Kidney stones 2005  . Erosive esophagitis     Grade A  . COLONIC POLYPS, HYPERPLASTIC 08/05/2007    Qualifier: Diagnosis of  By: Misty Stanley CMA (AAMA), Marchelle Folks    . ESOPHAGITIS, HX OF 12/15/2007    Qualifier: Diagnosis of  By: Burnadette Pop  MD, Trisha Mangle      Medications:     . sodium chloride 125 mL/hr at 02/24/12 0941  . heparin 2,150 Units/hr (02/24/12 1540)       Assessment: Bilateral PE:  Heparin level now within goal, on 2150 units/hr. Infusing without problems, not bleeding noted.  Goal of Therapy:  INR 2-3 Heparin level 0.3-0.7 units/ml Monitor platelets by anticoagulation protocol: Yes   Plan:  1. Increase  Heparin to 2250 units/hr to maintain within therapeutic goal 2. Check heparin level with AML    Thank you for allowing pharmacy to be a part of this patients care team.  Lovenia Kim Pharm.D., BCPS Clinical Pharmacist 02/25/2012 12:26 AM Pager: [phone] Phone: [phone]
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
## 2012-02-25 NOTE — Progress Notes (Signed)
ANTICOAGULATION CONSULT NOTE - Follow Up Consult  Pharmacy Consult for heparin and warfarin Indication: pulmonary embolus  Allergies  Allergen Reactions  . Vancomycin     "red man syndrome"  . Clarithromycin     REACTION: nausea and tremor  . Gabapentin     REACTION: nausea, dizziness    Patient Measurements: Height: 5\' 9"  (175.3 cm) Weight: 216 lb 4.3 oz (98.1 kg) IBW/kg (Calculated) : 70.7  Heparin dosing weight 91.3 kg  Vital Signs: Temp: 98.6 F (37 C) (09/30 0900) Temp src: Oral (09/30 0900) BP: 109/69 mmHg (09/30 0900) Pulse Rate: 70  (09/30 0900)  Labs:  Basename 02/25/12 0505 02/24/12 2305 02/24/12 1313 02/24/12 0535 02/23/12 2120 02/23/12 1742  HGB 12.7* -- -- 14.0 -- --  HCT 38.2* -- -- 40.6 -- 42.5  PLT 197 -- -- 188 -- 209  APTT -- -- -- -- 34 --  LABPROT 13.4 -- -- -- 13.2 --  INR 1.03 -- -- -- 1.01 --  HEPARINUNFRC 0.31 0.40 <0.10* -- -- --  CREATININE -- -- -- 0.92 -- 1.07  CKTOTAL -- -- -- -- -- --  CKMB -- -- -- -- -- --  TROPONINI -- -- -- -- -- --    Estimated Creatinine Clearance: 125.8 ml/min (by C-G formula based on Cr of 0.92).   Medical History: Past Medical History  Diagnosis Date  . Hiatal hernia   . GERD (gastroesophageal reflux disease)   . IBS (irritable bowel syndrome)   . Diverticulosis   . Neck pain, chronic   . Back pain, chronic   . Anxiety   . Kidney stones 2005  . Erosive esophagitis     Grade A  . COLONIC POLYPS, HYPERPLASTIC 08/05/2007    Qualifier: Diagnosis of  By: Misty Stanley CMA (AAMA), Marchelle Folks    . ESOPHAGITIS, HX OF 12/15/2007    Qualifier: Diagnosis of  By: Burnadette Pop  MD, Trisha Mangle      Medications:     . sodium chloride 125 mL/hr at 02/24/12 0941  . heparin 2,250 Units/hr (02/25/12 0030)       Assessment: Bilateral PE:  Heparin level = 0.31 this AM on 2250 units/hr IV heparin. Remains therapeutic. Dr. Isidoro Donning reported patient had 1 episode of scant hemoptysis with coughing at 9:10 am today. Dr. Isidoro Donning ordered  that we do not give a heparin bolus or increase infusion today. PLTC stable =197K. Hgb =12.7.   INR = 1.03 today.  Coumadin started yesterday.  Warfarin predictor points for this patient = 10 points   Goal of Therapy:  INR 2-3 Heparin level 0.3-0.7 units/ml Monitor platelets by anticoagulation protocol: Yes   Plan:  1. Continue Heparin IV infusion at  2250 units/hr.  2. Coumadin 10 mg po today.  3. Check heparin level, CBC and INR qAM    Thank you for allowing pharmacy to be a part of this patients care team.  Noah Delaine, RPh Clinical Pharmacist Pager: [phone] 02/25/2012 10:56AM

## 2012-02-25 NOTE — Plan of Care (Signed)
Problem: Phase III Progression Outcomes Goal: Discharge plan remains appropriate-arrangements made Outcome: Progressing Pt and wife understand that awaiting INR to become therapeutic. Thomas Hoff

## 2012-02-26 DIAGNOSIS — I2699 Other pulmonary embolism without acute cor pulmonale: Secondary | ICD-10-CM | POA: Diagnosis not present

## 2012-02-26 DIAGNOSIS — R05 Cough: Secondary | ICD-10-CM | POA: Diagnosis not present

## 2012-02-26 DIAGNOSIS — K219 Gastro-esophageal reflux disease without esophagitis: Secondary | ICD-10-CM | POA: Diagnosis not present

## 2012-02-26 DIAGNOSIS — R197 Diarrhea, unspecified: Secondary | ICD-10-CM | POA: Diagnosis not present

## 2012-02-26 DIAGNOSIS — R51 Headache: Secondary | ICD-10-CM

## 2012-02-26 LAB — PROTIME-INR: INR: 1.09 (ref 0.00–1.49)

## 2012-02-26 LAB — HEPARIN LEVEL (UNFRACTIONATED): Heparin Unfractionated: 0.47 IU/mL (ref 0.30–0.70)

## 2012-02-26 LAB — CBC
HCT: 38.9 % — ABNORMAL LOW (ref 39.0–52.0)
Hemoglobin: 13.3 g/dL (ref 13.0–17.0)
MCV: 86.8 fL (ref 78.0–100.0)
RBC: 4.48 MIL/uL (ref 4.22–5.81)
WBC: 8 10*3/uL (ref 4.0–10.5)

## 2012-02-26 MED ORDER — WARFARIN SODIUM 2.5 MG PO TABS
12.5000 mg | ORAL_TABLET | Freq: Once | ORAL | Status: AC
  Administered 2012-02-26: 12.5 mg via ORAL
  Filled 2012-02-26: qty 1

## 2012-02-26 MED ORDER — ENOXAPARIN SODIUM 150 MG/ML ~~LOC~~ SOLN
150.0000 mg | SUBCUTANEOUS | Status: DC
  Administered 2012-02-26 – 2012-02-27 (×2): 150 mg via SUBCUTANEOUS
  Filled 2012-02-26 (×2): qty 1

## 2012-02-26 MED ORDER — HYDROCODONE-HOMATROPINE 5-1.5 MG/5ML PO SYRP
5.0000 mL | ORAL_SOLUTION | Freq: Two times a day (BID) | ORAL | Status: DC
  Administered 2012-02-26 – 2012-02-27 (×2): 5 mL via ORAL
  Filled 2012-02-26 (×2): qty 5

## 2012-02-26 MED ORDER — ENOXAPARIN (LOVENOX) PATIENT EDUCATION KIT
PACK | Freq: Once | Status: AC
  Administered 2012-02-26: 10:00:00
  Filled 2012-02-26: qty 1

## 2012-02-26 NOTE — Progress Notes (Signed)
Gave pt lovenox kit and showed lovenox video. Began lovenox injections education with pt and his wife. Educated on the importance of rotating sites and injecting into the love handles. Explained how to give injection and to not expel air bubble before giving injection. Pt acknowledged understanding and will practice injecting himself tomorrow.

## 2012-02-26 NOTE — Progress Notes (Signed)
Pt. states he is coughing up a small amount of blood. Unable to assess because pt. threw away tissue. Requested pt. to keep tissue if it happens again. Will continue to monitor.

## 2012-02-26 NOTE — Care Management Note (Signed)
    Page 1 of 2   02/26/2012     2:16:22 PM   CARE MANAGEMENT NOTE 02/26/2012  Patient:  [name], [name]   Account Number:  [id number]  Date Initiated:  02/25/2012  Documentation initiated by:  Avie Arenas  Subjective/Objective Assessment:   PE -  Spouse - independent.     Action/Plan:   Anticipated DC Date:  02/27/2012   Anticipated DC Plan:  HOME/SELF CARE      DC Planning Services  CM consult      Choice offered to / List presented to:             Status of service:  In process, will continue to follow Medicare Important Message given?   (If response is "NO", the following Medicare IM given date fields will be blank) Date Medicare IM given:   Date Additional Medicare IM given:    Discharge Disposition:    Per UR Regulation:  Reviewed for med. necessity/level of care/duration of stay  If discussed at Long Length of Stay Meetings, dates discussed:    CommentsBenay PillowAbanoub, Shaban Spouse [phone]  02-26-12 11:44am Avie Arenas, RNBSN [phone] Plan for patient to be discharged tomorrow on lovenox and coumadin.  Will need f/u PT/INR in PCP on Friday and Monday. Set up with Dr. Marvel Plan at [facility] in [facility] for 9:30am on 10-4, Friday and 9:30am on Monday 10-7.  Verified with his Pharmacy - [facility] in [place], they do not have 150 lovenox injectables in but will order and will have in by noon on Wednesday for p/u.  Verifed that they would order those today for p/u tomorrow. informed patient of cost. LOVENOX (GENERIC) IS A TIER 2 WHICH IS A $35.00 CO-PAY PATIENT WILL PAY CO-PAY IF IT IS A PREVENTATIVE MEDICATION, OTHERWISE IT IS A 20% CO-INSURANCE  COUMADIN IS A TIER 2 WHICH IS A $35.00 CO-PAY GENERIC IS A TIER 1 WHICH IS A $15.00 CO-PAY PATIENT WILL PAY CO-PAY IF IT IS A PREVENTATIVE MEDICATION, OTHERWISE IT IS A 20% CO-INSURANCE
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
## 2012-02-27 DIAGNOSIS — F411 Generalized anxiety disorder: Secondary | ICD-10-CM | POA: Diagnosis not present

## 2012-02-27 DIAGNOSIS — R509 Fever, unspecified: Secondary | ICD-10-CM | POA: Diagnosis not present

## 2012-02-27 DIAGNOSIS — I2699 Other pulmonary embolism without acute cor pulmonale: Secondary | ICD-10-CM | POA: Diagnosis not present

## 2012-02-27 DIAGNOSIS — K219 Gastro-esophageal reflux disease without esophagitis: Secondary | ICD-10-CM | POA: Diagnosis not present

## 2012-02-27 LAB — PROTIME-INR
INR: 1.25 (ref 0.00–1.49)
Prothrombin Time: 15.5 seconds — ABNORMAL HIGH (ref 11.6–15.2)

## 2012-02-27 LAB — CBC
HCT: 40.5 % (ref 39.0–52.0)
Hemoglobin: 13.3 g/dL (ref 13.0–17.0)
MCHC: 32.8 g/dL (ref 30.0–36.0)
RBC: 4.68 MIL/uL (ref 4.22–5.81)
WBC: 7.4 10*3/uL (ref 4.0–10.5)

## 2012-02-27 LAB — HEPARIN LEVEL (UNFRACTIONATED): Heparin Unfractionated: 0.19 IU/mL — ABNORMAL LOW (ref 0.30–0.70)

## 2012-02-27 MED ORDER — GUAIFENESIN-DM 100-10 MG/5ML PO SYRP: 5.0000 mL | ORAL_SOLUTION | ORAL | Status: DC | PRN

## 2012-02-27 MED ORDER — WARFARIN SODIUM 2.5 MG PO TABS
12.5000 mg | ORAL_TABLET | Freq: Once | ORAL | Status: AC
  Administered 2012-02-27: 12.5 mg via ORAL
  Filled 2012-02-27: qty 1

## 2012-02-27 MED ORDER — ENOXAPARIN SODIUM 150 MG/ML ~~LOC~~ SOLN: 150.0000 mg | SUBCUTANEOUS | Status: DC

## 2012-02-27 MED ORDER — WARFARIN SODIUM 7.5 MG PO TABS: 7.5000 mg | ORAL_TABLET | Freq: Every day | ORAL | Status: DC

## 2012-02-27 MED ORDER — HYDROCODONE-ACETAMINOPHEN 5-325 MG PO TABS: 1.0000 | ORAL_TABLET | ORAL | Status: DC | PRN
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
## 2012-02-27 NOTE — Progress Notes (Signed)
Reviewed discharge instructions with pt and wife, voiced understanding of meds, followup lab work, lovenox.

## 2012-02-27 NOTE — Progress Notes (Addendum)
ANTICOAGULATION CONSULT NOTE - Follow Up Consult  Pharmacy Consult for Lovenox and warfarin Indication: pulmonary embolus  Allergies  Allergen Reactions  . Vancomycin     "red man syndrome"  . Clarithromycin     REACTION: nausea and tremor  . Gabapentin     REACTION: nausea, dizziness    Patient Measurements: Height: 5\' 9"  (175.3 cm) Weight: 216 lb 4.3 oz (98.1 kg) IBW/kg (Calculated) : 70.7  Heparin dosing weight 91.3 kg  Vital Signs: Temp: 98.3 F (36.8 C) (10/02 0455) Temp src: Oral (10/02 0455) BP: 123/84 mmHg (10/02 0455) Pulse Rate: 76  (10/02 0455)  Labs:  Basename 02/27/12 0640 02/26/12 0625 02/25/12 0505  HGB 13.3 13.3 --  HCT 40.5 38.9* 38.2*  PLT 274 234 197  APTT -- -- --  LABPROT 15.5* 14.0 13.4  INR 1.25 1.09 1.03  HEPARINUNFRC 0.19* 0.47 0.31  CREATININE -- -- --  CKTOTAL -- -- --  CKMB -- -- --  TROPONINI -- -- --    Estimated Creatinine Clearance: 125.8 ml/min (by C-G formula based on Cr of 0.92).   Medical History: Past Medical History  Diagnosis Date  . Hiatal hernia   . GERD (gastroesophageal reflux disease)   . IBS (irritable bowel syndrome)   . Diverticulosis   . Neck pain, chronic   . Back pain, chronic   . Anxiety   . Kidney stones 2005  . Erosive esophagitis     Grade A  . COLONIC POLYPS, HYPERPLASTIC 08/05/2007    Qualifier: Diagnosis of  By: Misty Stanley CMA (AAMA), Marchelle Folks    . ESOPHAGITIS, HX OF 12/15/2007    Qualifier: Diagnosis of  By: Burnadette Pop  MD, Trisha Mangle        Assessment: Bilateral PE:   Day#4/5 minimum Lovenox/Coumadi overlap Yesterday 10/1 Dr. Isidoro Donning ordered to change from IV heparin drip to SQ Lovenox and coumadin bridge for bilateral PE. Now on 1.5mg /kg q24h Lovenox (=150 mg SQ q24h).  INR = 1.25 today after 10mg , 10mg  and 12.5mg  over past 3 days. Warfarin predictor points for this patient = 10 points. INR slowly increasing.    had 1 episode of scant hemoptysis with coughing on 9/30 AM. RN noted last evening that  patient is coughing up a small amount of blood. RN unable to assess because pt.threw away tissue.  PLTC stable =274K. Hgb =13.3.   On 10/1 Dr. Isidoro Donning reported plan to discharge today if patient medically stable, on Lovenox 150mg  SQ q24h and coumadin with INR check on Fri 10/4 and Mon. 03/03/12.  INR slowly increasing.  Expect patient will need ~10mg  daily, but give higher dose today. Monitor for bleeding.    Goal of Therapy:  INR =2-3 Monitor platelets by anticoagulation protocol: Yes   Plan:  Lovenox 150 mg SQ q24 hours. Coumadin 12.5 mg po x1 today, then 10mg  daily. If home today needs INR check Fri 10/4 and Mon 10/7, then at least once weekly until INR more stable. (Consider outpatient RX for 5 mg Coumadin tablets for easier adjustment of dosage).    Thank you for allowing pharmacy to be a part of this patients care team.  Noah Delaine, RPh Clinical Pharmacist Pager: [phone] 02/27/2012 10:56AM

## 2012-02-28 ENCOUNTER — Telehealth: Payer: Self-pay | Admitting: *Deleted

## 2012-02-28 NOTE — Telephone Encounter (Signed)
VM left by patient's wife.  Message left to [facility].

## 2012-02-29 ENCOUNTER — Ambulatory Visit (INDEPENDENT_AMBULATORY_CARE_PROVIDER_SITE_OTHER): Payer: 59 | Admitting: Family Medicine

## 2012-02-29 ENCOUNTER — Encounter: Payer: Self-pay | Admitting: Family Medicine

## 2012-02-29 VITALS — BP 119/86 | HR 87 | Ht 69.0 in | Wt 211.0 lb

## 2012-02-29 DIAGNOSIS — R61 Generalized hyperhidrosis: Secondary | ICD-10-CM

## 2012-02-29 DIAGNOSIS — I2699 Other pulmonary embolism without acute cor pulmonale: Secondary | ICD-10-CM

## 2012-02-29 DIAGNOSIS — D6859 Other primary thrombophilia: Secondary | ICD-10-CM | POA: Diagnosis not present

## 2012-02-29 DIAGNOSIS — R509 Fever, unspecified: Secondary | ICD-10-CM

## 2012-02-29 DIAGNOSIS — R634 Abnormal weight loss: Secondary | ICD-10-CM

## 2012-02-29 LAB — CBC WITH DIFFERENTIAL/PLATELET
Basophils Relative: 1 % (ref 0.0–3.0)
Eosinophils Absolute: 0 10*3/uL (ref 0.0–0.7)
Hemoglobin: 13.6 g/dL (ref 13.0–17.0)
Lymphocytes Relative: 28.5 % (ref 12.0–46.0)
MCHC: 33.2 g/dL (ref 30.0–36.0)
Neutro Abs: 3.7 10*3/uL (ref 1.4–7.7)
RBC: 4.63 Mil/uL (ref 4.22–5.81)

## 2012-02-29 LAB — URINALYSIS, ROUTINE W REFLEX MICROSCOPIC
Specific Gravity, Urine: 1.02 (ref 1.000–1.030)
Urine Glucose: NEGATIVE
pH: 6 (ref 5.0–8.0)

## 2012-02-29 LAB — COMPREHENSIVE METABOLIC PANEL
AST: 34 U/L (ref 0–37)
Alkaline Phosphatase: 64 U/L (ref 39–117)
BUN: 16 mg/dL (ref 6–23)
Calcium: 9.9 mg/dL (ref 8.4–10.5)
Chloride: 101 mEq/L (ref 96–112)
Creatinine, Ser: 1 mg/dL (ref 0.4–1.5)
Total Bilirubin: 0.7 mg/dL (ref 0.3–1.2)

## 2012-02-29 LAB — PROTIME-INR: Prothrombin Time: 20.7 s — ABNORMAL HIGH (ref 10.2–12.4)

## 2012-02-29 NOTE — Telephone Encounter (Signed)
No RC prior to visit today.

## 2012-03-02 DIAGNOSIS — R509 Fever, unspecified: Secondary | ICD-10-CM | POA: Insufficient documentation

## 2012-03-02 NOTE — Assessment & Plan Note (Addendum)
Plus abnormal wt loss (he cites approx 15-20 lbs over the last couple of weeks, not accounted for fully by brief periods of poor PO intake). With PE now (plus the superficial venous thromboemboli vs phlebitis he has in his upper extremities that he developed during his previous hospitalization for the febrile illness) plus this wt loss and recurrent night sweats I will do w/u for occult malignancy. CBC w/diff with pathologist review of smear, TB skin test, CMET, TSH, PSA, CRP, lipase, hemoccults x 3, UA.  HIs PE study didn't show any other lung abnormalities.

## 2012-03-02 NOTE — Assessment & Plan Note (Addendum)
Resolved, but etiology still questionable. Perhaps this factors in as a clue to what is going on in the overall picture here that now included pulmonary emboli. See orders under night sweats section.  Also, will check RMSF titers and will check HIV RNA quant to see if there is any evidence that this was an acute HIV infection.
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
## 2012-03-03 ENCOUNTER — Other Ambulatory Visit (INDEPENDENT_AMBULATORY_CARE_PROVIDER_SITE_OTHER): Payer: 59

## 2012-03-03 ENCOUNTER — Other Ambulatory Visit: Payer: Self-pay | Admitting: Family Medicine

## 2012-03-03 DIAGNOSIS — I2699 Other pulmonary embolism without acute cor pulmonale: Secondary | ICD-10-CM

## 2012-03-03 LAB — TB SKIN TEST: TB Skin Test: NEGATIVE

## 2012-03-03 LAB — PATHOLOGIST SMEAR REVIEW

## 2012-03-03 LAB — ROCKY MTN SPOTTED FVR ABS PNL(IGG+IGM)
RMSF IgG: 0.2 IV
RMSF IgM: 0.42 IV

## 2012-03-03 MED ORDER — WARFARIN SODIUM 5 MG PO TABS: ORAL_TABLET | ORAL | Status: DC

## 2012-03-04 LAB — HIV-1 RNA QUANT-NO REFLEX-BLD
HIV 1 RNA Quant: <20 copies/mL (ref <20)
HIV-1 RNA Quant, Log: <1.3 {Log} (ref <1.30)

## 2012-03-06 ENCOUNTER — Other Ambulatory Visit (INDEPENDENT_AMBULATORY_CARE_PROVIDER_SITE_OTHER): Payer: 59

## 2012-03-06 DIAGNOSIS — I2699 Other pulmonary embolism without acute cor pulmonale: Secondary | ICD-10-CM

## 2012-03-07 ENCOUNTER — Other Ambulatory Visit: Payer: 59

## 2012-03-07 LAB — HEMOCCULT SLIDES (X 3 CARDS)
Fecal Occult Blood: NEGATIVE
OCCULT 2: NEGATIVE
OCCULT 3: NEGATIVE
OCCULT 5: NEGATIVE

## 2012-03-07 NOTE — Progress Notes (Signed)
Quick Note:  Pt states he took 10 on tues and wed And on thurs took 7.5. So are you wanting pt to continue taking 7.5 ______

## 2012-03-07 NOTE — Progress Notes (Signed)
Quick Note:  Patient Informed and voiced understanding  Pt informed per md to stay on 7.5 ______

## 2012-03-10 NOTE — Progress Notes (Signed)
Quick Note:  Patient Informed and voiced understanding ______ 

## 2012-03-11 ENCOUNTER — Encounter: Payer: Self-pay | Admitting: Family Medicine

## 2012-03-11 ENCOUNTER — Ambulatory Visit (INDEPENDENT_AMBULATORY_CARE_PROVIDER_SITE_OTHER): Payer: 59 | Admitting: Family Medicine

## 2012-03-11 VITALS — BP 108/79 | HR 75 | Ht 69.0 in | Wt 214.0 lb

## 2012-03-11 DIAGNOSIS — R61 Generalized hyperhidrosis: Secondary | ICD-10-CM | POA: Diagnosis not present

## 2012-03-11 DIAGNOSIS — G4733 Obstructive sleep apnea (adult) (pediatric): Secondary | ICD-10-CM

## 2012-03-11 DIAGNOSIS — I2699 Other pulmonary embolism without acute cor pulmonale: Secondary | ICD-10-CM | POA: Diagnosis not present

## 2012-03-11 NOTE — Assessment & Plan Note (Signed)
Plus abnormal wt loss.  These sx's are stabilizing, wt actually up a few pounds since last visit. I was working this up in the context of his recent unexplained febrile illness, and all blood testing, urine testing, and stool testing returned negative. The only abnormalities that bear repeating are mildly elevated ALT, mildly elevated platelets, and his lyme and RMSF serologies which were initially negative.  We'll do these repeats at f/u office visit in 6 wks.

## 2012-03-11 NOTE — Assessment & Plan Note (Signed)
Stable. PT/INR today. If therapeutic again today then will do next check in 2 wks and continue 7.5mg  qd coumadin dosing.  He is already through his lovenox bridge.

## 2012-03-11 NOTE — Assessment & Plan Note (Signed)
High risk, symptoms match the syndrome, needs sleep study in sleep lab, ordered today for [facility] sleep lab.

## 2012-03-11 NOTE — Progress Notes (Signed)
OFFICE NOTE  03/11/2012  CC:  Chief Complaint  Patient presents with  . Follow-up    PE, INR     HPI: Patient is a 41 y.o. Caucasian male who is here for f/u pulmonary emboli/INR check.  He is feeling much better.  No SOB, no hemoptysis, no chest pain.  She has some itchy throat that is associated with dry cough--not seeming to change much over the weeks.  No wheezing.  Denies heartburn.  Night sweats have occurred only every other night lately and they are not as profuse as before.  Appetite is good, wt is coming up a little.    Has long hx of snoring, apneic spells witnessed by wife for "years", some excessive daytime sleepiness. Was supposed to have gotten a sleep study in the past but somehow it never happened.  He does want to pursue one now.  ROS: no nose bleeds, no BRBPR, no melena, no easy bruising.  Pertinent PMH:  Past Medical History  Diagnosis Date  . Hiatal hernia   . GERD (gastroesophageal reflux disease)   . IBS (irritable bowel syndrome)   . Diverticulosis   . Neck pain, chronic   . Back pain, chronic   . Anxiety   . Kidney stones 2005  . Erosive esophagitis     Grade A  . COLONIC POLYPS, HYPERPLASTIC 08/05/2007    Qualifier: Diagnosis of  By: Misty Stanley CMA (AAMA), Marchelle Folks    . ESOPHAGITIS, HX OF 12/15/2007    Qualifier: Diagnosis of  By: Burnadette Pop  MD, Trisha Mangle    . Pulmonary emboli 01/2012    MEDS:  Outpatient Prescriptions Prior to Visit  Medication Sig Dispense Refill  . citalopram (CELEXA) 20 MG tablet TAKE 1 TABLET BY MOUTH EVERY DAY  30 tablet  3  . cyclobenzaprine (FLEXERIL) 10 MG tablet Take 10 mg by mouth at bedtime as needed. For muscle spasm       . fluticasone (FLONASE) 50 MCG/ACT nasal spray Place 2 sprays into the nose daily.  16 g  6  . guaiFENesin-dextromethorphan (ROBITUSSIN DM) 100-10 MG/5ML syrup Take 5 mLs by mouth every 4 (four) hours as needed for cough.  118 mL  0  . ibuprofen (ADVIL,MOTRIN) 800 MG tablet Take 1 tablet (800 mg total)  by mouth every 8 (eight) hours as needed for pain.  45 tablet  1  . omeprazole (PRILOSEC) 20 MG capsule Take 20 mg by mouth daily.      . ondansetron (ZOFRAN) 4 MG tablet Take 1 tablet (4 mg total) by mouth every 8 (eight) hours as needed for nausea.  10 tablet  0  . oxyCODONE-acetaminophen (PERCOCET/ROXICET) 5-325 MG per tablet Take 1 tablet by mouth every 6 (six) hours as needed. For pain      . warfarin (COUMADIN) 5 MG tablet Take as directed.  15 tablet  3  . mupirocin ointment (BACTROBAN) 2 % Apply topically 3 (three) times daily.  15 g  0  . warfarin (COUMADIN) 7.5 MG tablet Take 1 tablet (7.5 mg total) by mouth daily.  15 tablet  0  . enoxaparin (LOVENOX) 150 MG/ML injection Inject 1 mL (150 mg total) into the skin daily.  2 Syringe  0  . HYDROcodone-acetaminophen (NORCO/VICODIN) 5-325 MG per tablet Take 1-2 tablets by mouth every 4 (four) hours as needed.  30 tablet  0  . saccharomyces boulardii (FLORASTOR) 250 MG capsule Take 1 capsule (250 mg total) by mouth 2 (two) times daily.  30 capsule  0  **  Not on lovenox as listed above  PE: Blood pressure 108/79, pulse 75, height 5\' 9"  (1.753 m), weight 214 lb (97.07 kg). ENT:   Eyes: no injection, icteris, swelling, or exudate.  EOMI, PERRLA. Nose: no drainage or turbinate edema/swelling.  No injection or focal lesion.  Mouth: lips without lesion/swelling.  Oral mucosa pink and moist.  Dentition intact and without obvious caries or gingival swelling.  Oropharynx without erythema, exudate, or swelling.  Neck - No masses or thyromegaly or limitation in range of motion CV: RRR, no m/r/g.   LUNGS: CTA bilat, nonlabored resps, good aeration in all lung fields. EXT: no clubbing, cyanosis, or edema.   Quality of sleep questionnaire today scored 12 (very high risk of OSA).  IMPRESSION AND PLAN:  Pulmonary embolism Stable. PT/INR today. If therapeutic again today then will do next check in 2 wks and continue 7.5mg  qd coumadin dosing.  He is  already through his lovenox bridge.  Night sweats Plus abnormal wt loss.  These sx's are stabilizing, wt actually up a few pounds since last visit. I was working this up in the context of his recent unexplained febrile illness, and all blood testing, urine testing, and stool testing returned negative. The only abnormalities that bear repeating are mildly elevated ALT, mildly elevated platelets, and his lyme and RMSF serologies which were initially negative.  We'll do these repeats at f/u office visit in 6 wks.  OSA (obstructive sleep apnea) High risk, symptoms match the syndrome, needs sleep study in sleep lab, ordered today for [facility] sleep lab.   An After Visit Summary was printed and given to the patient.   FOLLOW UP: 6 wks

## 2012-03-13 ENCOUNTER — Other Ambulatory Visit: Payer: Self-pay | Admitting: *Deleted

## 2012-03-13 MED ORDER — WARFARIN SODIUM 7.5 MG PO TABS: 7.5000 mg | ORAL_TABLET | Freq: Every day | ORAL | Status: DC

## 2012-03-13 NOTE — Telephone Encounter (Signed)
Refill needed on warfarin 7.5 dose.  RX sent to pharmacy.

## 2012-03-18 ENCOUNTER — Other Ambulatory Visit (INDEPENDENT_AMBULATORY_CARE_PROVIDER_SITE_OTHER): Payer: 59

## 2012-03-18 ENCOUNTER — Other Ambulatory Visit: Payer: 59

## 2012-03-18 DIAGNOSIS — I2699 Other pulmonary embolism without acute cor pulmonale: Secondary | ICD-10-CM | POA: Diagnosis not present

## 2012-03-18 LAB — PROTIME-INR: INR: 2.1 ratio — ABNORMAL HIGH (ref 0.8–1.0)

## 2012-03-19 ENCOUNTER — Other Ambulatory Visit (INDEPENDENT_AMBULATORY_CARE_PROVIDER_SITE_OTHER): Payer: 59

## 2012-03-19 DIAGNOSIS — Z0000 Encounter for general adult medical examination without abnormal findings: Secondary | ICD-10-CM

## 2012-03-19 LAB — LIPID PANEL
Cholesterol: 202 mg/dL — ABNORMAL HIGH (ref 0–200)
HDL: 33.2 mg/dL — ABNORMAL LOW
Total CHOL/HDL Ratio: 6
Triglycerides: 165 mg/dL — ABNORMAL HIGH (ref 0.0–149.0)
VLDL: 33 mg/dL (ref 0.0–40.0)

## 2012-03-19 LAB — LDL CHOLESTEROL, DIRECT: Direct LDL: 139.4 mg/dL

## 2012-03-25 ENCOUNTER — Other Ambulatory Visit: Payer: Self-pay | Admitting: Family Medicine

## 2012-03-25 DIAGNOSIS — I2699 Other pulmonary embolism without acute cor pulmonale: Secondary | ICD-10-CM

## 2012-04-02 ENCOUNTER — Other Ambulatory Visit: Payer: 59

## 2012-04-04 ENCOUNTER — Other Ambulatory Visit (INDEPENDENT_AMBULATORY_CARE_PROVIDER_SITE_OTHER): Payer: 59

## 2012-04-04 DIAGNOSIS — I2699 Other pulmonary embolism without acute cor pulmonale: Secondary | ICD-10-CM

## 2012-04-04 LAB — PROTIME-INR: Prothrombin Time: 18.5 s — ABNORMAL HIGH (ref 10.2–12.4)

## 2012-04-14 ENCOUNTER — Encounter (HOSPITAL_BASED_OUTPATIENT_CLINIC_OR_DEPARTMENT_OTHER): Payer: 59

## 2012-04-16 ENCOUNTER — Other Ambulatory Visit: Payer: Self-pay | Admitting: *Deleted

## 2012-04-16 ENCOUNTER — Ambulatory Visit (INDEPENDENT_AMBULATORY_CARE_PROVIDER_SITE_OTHER): Payer: 59 | Admitting: Family Medicine

## 2012-04-16 ENCOUNTER — Encounter: Payer: Self-pay | Admitting: Family Medicine

## 2012-04-16 VITALS — BP 127/67 | HR 87 | Ht 69.0 in | Wt 224.0 lb

## 2012-04-16 DIAGNOSIS — R634 Abnormal weight loss: Secondary | ICD-10-CM | POA: Diagnosis not present

## 2012-04-16 DIAGNOSIS — L740 Miliaria rubra: Secondary | ICD-10-CM

## 2012-04-16 DIAGNOSIS — I2699 Other pulmonary embolism without acute cor pulmonale: Secondary | ICD-10-CM | POA: Diagnosis not present

## 2012-04-16 DIAGNOSIS — R61 Generalized hyperhidrosis: Secondary | ICD-10-CM | POA: Diagnosis not present

## 2012-04-16 LAB — COMPREHENSIVE METABOLIC PANEL
Alkaline Phosphatase: 46 U/L (ref 39–117)
Creatinine, Ser: 1 mg/dL (ref 0.4–1.5)
GFR: 87.41 mL/min (ref >60.00)
Glucose, Bld: 91 mg/dL (ref 70–99)
Sodium: 137 mEq/L (ref 135–145)
Total Bilirubin: 0.6 mg/dL (ref 0.3–1.2)
Total Protein: 7.8 g/dL (ref 6.0–8.3)

## 2012-04-16 LAB — PROTIME-INR: Prothrombin Time: 15.3 s — ABNORMAL HIGH (ref 10.2–12.4)

## 2012-04-16 LAB — CBC WITH DIFFERENTIAL/PLATELET
Basophils Relative: 0.4 % (ref 0.0–3.0)
Eosinophils Absolute: 0 10*3/uL (ref 0.0–0.7)
Eosinophils Relative: 0 % (ref 0.0–5.0)
Hemoglobin: 14.6 g/dL (ref 13.0–17.0)
Lymphocytes Relative: 44.7 % (ref 12.0–46.0)
MCHC: 33.4 g/dL (ref 30.0–36.0)
Neutro Abs: 2.6 10*3/uL (ref 1.4–7.7)
RBC: 4.96 Mil/uL (ref 4.22–5.81)
WBC: 5.5 10*3/uL (ref 4.5–10.5)

## 2012-04-16 MED ORDER — OMEPRAZOLE 20 MG PO CPDR: 20.0000 mg | DELAYED_RELEASE_CAPSULE | Freq: Two times a day (BID) | ORAL | Status: DC

## 2012-04-16 MED ORDER — HYDROCORTISONE 2 % EX LOTN: TOPICAL_LOTION | CUTANEOUS | Status: DC

## 2012-04-16 MED ORDER — WARFARIN SODIUM 5 MG PO TABS: 10.0000 mg | ORAL_TABLET | Freq: Every day | ORAL | Status: DC

## 2012-04-16 NOTE — Assessment & Plan Note (Signed)
Will call in low potency steroid lotion.

## 2012-04-16 NOTE — Telephone Encounter (Signed)
Pt called stating he forgot to talk to Dr. Milinda Cave regarding letter to insurance company to pay for OTC omeprazole.  Pt was told to take 2 tabs daily by Dr. Ardell Isaacs office in 2012.  Pt is buying OTC.  Advised we can try to run through insurance to see if they will pay.  Per [employer] OMEPRAZOLE is tier 1 for all [facility] plans.  RX is sent to pharmacy.  Advised RX for cream was sent to pharmacy this AM during visit.  Pt understands that if prior approval is needed it may take a couple of days for insurance to answer request.

## 2012-04-16 NOTE — Assessment & Plan Note (Signed)
Very stable. Recheck labs today. Discussed possibility of lifetime coumadin therapy if lupus anticoagulant remains positive. Will consider hematologist referral in near future if lupus anticoagulant remains positive.

## 2012-04-16 NOTE — Progress Notes (Signed)
OFFICE NOTE  04/16/2012  CC:  Chief Complaint  Patient presents with  . Follow-up    PE     HPI: Patient is a 41 y.o. Caucasian male who is here for 1 mo f/u pulm emb and some unexplained wt loss and night sweats.  We planned to repeat Lyme dz and RMSF titers as well as CBC and CMET and lupus anticoagulant today.  He feels well today.  No longer having night sweats.  Appetite and food/fluid intake is great.  Complains of some small bumps all over his back lately, minimal itching.  Pertinent PMH:  Past Medical History  Diagnosis Date  . Hiatal hernia   . GERD (gastroesophageal reflux disease)   . IBS (irritable bowel syndrome)   . Diverticulosis   . Neck pain, chronic   . Back pain, chronic   . Anxiety   . Kidney stones 2005  . Erosive esophagitis     Grade A  . COLONIC POLYPS, HYPERPLASTIC 08/05/2007    Qualifier: Diagnosis of  By: Misty Stanley CMA (AAMA), Marchelle Folks    . ESOPHAGITIS, HX OF 12/15/2007    Qualifier: Diagnosis of  By: Burnadette Pop  MD, Trisha Mangle    . Pulmonary emboli 01/2012    MEDS:  Outpatient Prescriptions Prior to Visit  Medication Sig Dispense Refill  . citalopram (CELEXA) 20 MG tablet TAKE 1 TABLET BY MOUTH EVERY DAY  30 tablet  3  . cyclobenzaprine (FLEXERIL) 10 MG tablet Take 10 mg by mouth at bedtime as needed. For muscle spasm       . fluticasone (FLONASE) 50 MCG/ACT nasal spray Place 2 sprays into the nose daily.  16 g  6  . ibuprofen (ADVIL,MOTRIN) 800 MG tablet Take 1 tablet (800 mg total) by mouth every 8 (eight) hours as needed for pain.  45 tablet  1  . omeprazole (PRILOSEC) 20 MG capsule Take 20 mg by mouth daily.      [name] oxyCODONE-acetaminophen (PERCOCET/ROXICET) 5-325 MG per tablet Take 1 tablet by mouth every 6 (six) hours as needed. For pain      . warfarin (COUMADIN) 5 MG tablet Take as directed.  15 tablet  3  . warfarin (COUMADIN) 7.5 MG tablet Take 1 tablet (7.5 mg total) by mouth daily.  30 tablet  3  . guaiFENesin-dextromethorphan (ROBITUSSIN  DM) 100-10 MG/5ML syrup Take 5 mLs by mouth every 4 (four) hours as needed for cough.  118 mL  0  . mupirocin ointment (BACTROBAN) 2 % Apply topically 3 (three) times daily.  15 g  0  . ondansetron (ZOFRAN) 4 MG tablet Take 1 tablet (4 mg total) by mouth every 8 (eight) hours as needed for nausea.  10 tablet  0   Last reviewed on 04/16/2012 10:44 AM by [name], MD  PE: Blood pressure 127/67, pulse 87, height 5\' 9"  (1.753 m), weight 224 lb (101.606 kg). Gen: Alert, well appearing.  Patient is oriented to person, place, time, and situation. AFFECT: pleasant, lucid thought and speech. CV: RRR, no m/r/g.   LUNGS: CTA bilat, nonlabored resps, good aeration in all lung fields. SKIN: back with scattered 1mm pink papules c/w mild heat rash.  IMPRESSION AND PLAN:  Pulmonary embolism Very stable. Recheck labs today. Discussed possibility of lifetime coumadin therapy if lupus anticoagulant remains positive. Will consider hematologist referral in near future if lupus anticoagulant remains positive.  Heat rash Will call in low potency steroid lotion.  Weight loss, abnormal Resolved, gaining wt well now.  This  was likely due to prolonged acute illness recently.  Night sweats Resolved. Likely related to recent prolonged acute illness with subsequent PE's.   An After Visit Summary was printed and given to the patient.  FOLLOW UP: 79mo

## 2012-04-17 ENCOUNTER — Telehealth: Payer: Self-pay | Admitting: *Deleted

## 2012-04-17 LAB — LUPUS ANTICOAGULANT PANEL: Lupus Anticoagulant: NOT DETECTED

## 2012-04-17 MED ORDER — ALCLOMETASONE DIPROPIONATE 0.05 % EX CREA: TOPICAL_CREAM | Freq: Two times a day (BID) | CUTANEOUS | Status: DC

## 2012-04-17 NOTE — Telephone Encounter (Signed)
Rx for different cream sent to pharmacy.

## 2012-04-17 NOTE — Telephone Encounter (Signed)
PC from [facility] stating Hydrocortisone cream is not available to order.  Would like RX for something different.  Please advise new RX.

## 2012-04-18 NOTE — Telephone Encounter (Signed)
Pt aware new RX has been called in.

## 2012-04-21 ENCOUNTER — Other Ambulatory Visit: Payer: 59

## 2012-04-22 ENCOUNTER — Other Ambulatory Visit: Payer: 59

## 2012-04-23 ENCOUNTER — Other Ambulatory Visit (INDEPENDENT_AMBULATORY_CARE_PROVIDER_SITE_OTHER): Payer: 59

## 2012-04-23 DIAGNOSIS — I2699 Other pulmonary embolism without acute cor pulmonale: Secondary | ICD-10-CM

## 2012-04-23 LAB — PROTIME-INR: INR: 1.9 ratio — ABNORMAL HIGH (ref 0.8–1.0)

## 2012-04-23 NOTE — Assessment & Plan Note (Signed)
Resolved, gaining wt well now.  This was likely due to prolonged acute illness recently.

## 2012-04-23 NOTE — Assessment & Plan Note (Signed)
Resolved. Likely related to recent prolonged acute illness with subsequent PE's.

## 2012-04-25 ENCOUNTER — Telehealth: Payer: Self-pay | Admitting: Oncology

## 2012-04-25 ENCOUNTER — Other Ambulatory Visit: Payer: Self-pay | Admitting: Family Medicine

## 2012-04-25 ENCOUNTER — Ambulatory Visit (INDEPENDENT_AMBULATORY_CARE_PROVIDER_SITE_OTHER): Payer: 59

## 2012-04-25 DIAGNOSIS — Z23 Encounter for immunization: Secondary | ICD-10-CM

## 2012-04-25 DIAGNOSIS — I2699 Other pulmonary embolism without acute cor pulmonale: Secondary | ICD-10-CM

## 2012-04-25 DIAGNOSIS — R760 Raised antibody titer: Secondary | ICD-10-CM

## 2012-04-25 NOTE — Telephone Encounter (Signed)
S/W pt in re NP appt 12/06 @ 2:30 w/Dr. Arline Asp  Referring Dr. Milinda Cave Dx-Lupus anti-coag positive, PE  Welcome packet mailed.

## 2012-04-28 ENCOUNTER — Telehealth: Payer: Self-pay | Admitting: Oncology

## 2012-04-28 NOTE — Telephone Encounter (Signed)
D/C 04/28/12 for appt.05/02/12

## 2012-04-30 ENCOUNTER — Telehealth: Payer: Self-pay | Admitting: Oncology

## 2012-04-30 ENCOUNTER — Other Ambulatory Visit: Payer: Self-pay | Admitting: Family Medicine

## 2012-04-30 DIAGNOSIS — I2699 Other pulmonary embolism without acute cor pulmonale: Secondary | ICD-10-CM

## 2012-04-30 NOTE — Telephone Encounter (Signed)
Pt wife called to r/s appt to 12/10 @ 10:30 w/Dr. Clelia Croft.  Calendar mailed.

## 2012-05-01 ENCOUNTER — Other Ambulatory Visit (INDEPENDENT_AMBULATORY_CARE_PROVIDER_SITE_OTHER): Payer: 59

## 2012-05-01 DIAGNOSIS — I2699 Other pulmonary embolism without acute cor pulmonale: Secondary | ICD-10-CM

## 2012-05-01 LAB — PROTIME-INR: INR: 2.4 ratio — ABNORMAL HIGH (ref 0.8–1.0)

## 2012-05-02 ENCOUNTER — Ambulatory Visit: Payer: 59

## 2012-05-02 ENCOUNTER — Ambulatory Visit: Payer: 59 | Admitting: Oncology

## 2012-05-02 ENCOUNTER — Other Ambulatory Visit: Payer: 59 | Admitting: Lab

## 2012-05-02 ENCOUNTER — Other Ambulatory Visit: Payer: Self-pay | Admitting: Oncology

## 2012-05-02 DIAGNOSIS — I2699 Other pulmonary embolism without acute cor pulmonale: Secondary | ICD-10-CM

## 2012-05-05 ENCOUNTER — Ambulatory Visit (INDEPENDENT_AMBULATORY_CARE_PROVIDER_SITE_OTHER): Payer: 59 | Admitting: Family Medicine

## 2012-05-05 ENCOUNTER — Encounter: Payer: Self-pay | Admitting: Family Medicine

## 2012-05-05 VITALS — BP 124/82 | HR 80 | Ht 69.0 in | Wt 225.0 lb

## 2012-05-05 DIAGNOSIS — R0789 Other chest pain: Secondary | ICD-10-CM | POA: Insufficient documentation

## 2012-05-05 DIAGNOSIS — S4352XA Sprain of left acromioclavicular joint, initial encounter: Secondary | ICD-10-CM | POA: Insufficient documentation

## 2012-05-05 DIAGNOSIS — S4350XA Sprain of unspecified acromioclavicular joint, initial encounter: Secondary | ICD-10-CM

## 2012-05-05 NOTE — Assessment & Plan Note (Signed)
Reassured pt that I think this is either musculoskeletal or GI in etiology.  No red flags for cardiopulmonary etiology. Encouraged him to still try to eat reflux-friendly diet even though he takes daily PPI.   Discussed red flags for cardiac or other worrisome chest pain.
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
## 2012-05-05 NOTE — Assessment & Plan Note (Signed)
Reassured pt that this was not an internal shoulder joint derangement.  I don't feel like any imaging is warranted at this time. I mentioned the option of steroid injection into this joint but I don't do this procedure so I recommended he contact his orthopedist for appt if he is interested in pursuing this treatment.  He must avoid NSAIDs due to current treatment with coumadin for his PE's.

## 2012-05-06 ENCOUNTER — Telehealth: Payer: Self-pay | Admitting: Oncology

## 2012-05-06 ENCOUNTER — Ambulatory Visit: Payer: 59

## 2012-05-06 ENCOUNTER — Encounter: Payer: Self-pay | Admitting: Oncology

## 2012-05-06 ENCOUNTER — Ambulatory Visit (HOSPITAL_BASED_OUTPATIENT_CLINIC_OR_DEPARTMENT_OTHER): Payer: 59 | Admitting: Oncology

## 2012-05-06 ENCOUNTER — Other Ambulatory Visit (HOSPITAL_BASED_OUTPATIENT_CLINIC_OR_DEPARTMENT_OTHER): Payer: 59 | Admitting: Lab

## 2012-05-06 VITALS — BP 114/72 | HR 61 | Temp 97.7°F | Resp 20 | Ht 69.0 in | Wt 224.5 lb

## 2012-05-06 DIAGNOSIS — I2699 Other pulmonary embolism without acute cor pulmonale: Secondary | ICD-10-CM | POA: Diagnosis not present

## 2012-05-06 LAB — CBC WITH DIFFERENTIAL/PLATELET
BASO%: 0.6 % (ref 0.0–2.0)
EOS%: 0.6 % (ref 0.0–7.0)
HCT: 45 % (ref 38.4–49.9)
LYMPH%: 48.1 % (ref 14.0–49.0)
MCH: 29.4 pg (ref 27.2–33.4)
MCHC: 33.5 g/dL (ref 32.0–36.0)
MONO#: 0.4 10*3/uL (ref 0.1–0.9)
NEUT%: 41.7 % (ref 39.0–75.0)
RBC: 5.13 10*6/uL (ref 4.20–5.82)
WBC: 5 10*3/uL (ref 4.0–10.3)
lymph#: 2.4 10*3/uL (ref 0.9–3.3)

## 2012-05-06 LAB — COMPREHENSIVE METABOLIC PANEL (CC13)
ALT: 38 U/L (ref 0–55)
AST: 19 U/L (ref 5–34)
Chloride: 104 mEq/L (ref 98–107)
Creatinine: 1.1 mg/dL (ref 0.7–1.3)
Sodium: 140 mEq/L (ref 136–145)
Total Bilirubin: 0.47 mg/dL (ref 0.20–1.20)
Total Protein: 7.4 g/dL (ref 6.4–8.3)

## 2012-05-06 NOTE — Progress Notes (Signed)
Note dictated

## 2012-05-06 NOTE — Progress Notes (Signed)
Checked in new pt with no financial concerns. °

## 2012-05-06 NOTE — Telephone Encounter (Signed)
gv pt appt schedule for March 2014. Pt aware central will contact him re ct appt for March 2014.
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
## 2012-05-13 ENCOUNTER — Encounter (HOSPITAL_BASED_OUTPATIENT_CLINIC_OR_DEPARTMENT_OTHER): Payer: Self-pay

## 2012-05-28 DIAGNOSIS — G4733 Obstructive sleep apnea (adult) (pediatric): Secondary | ICD-10-CM

## 2012-05-28 HISTORY — DX: Obstructive sleep apnea (adult) (pediatric): G47.33

## 2012-05-29 ENCOUNTER — Ambulatory Visit (INDEPENDENT_AMBULATORY_CARE_PROVIDER_SITE_OTHER): Payer: 59 | Admitting: Family Medicine

## 2012-05-29 ENCOUNTER — Encounter: Payer: Self-pay | Admitting: Family Medicine

## 2012-05-29 ENCOUNTER — Ambulatory Visit: Payer: Self-pay | Admitting: Family Medicine

## 2012-05-29 VITALS — BP 109/79 | HR 77 | Ht 69.0 in | Wt 222.0 lb

## 2012-05-29 DIAGNOSIS — G43009 Migraine without aura, not intractable, without status migrainosus: Secondary | ICD-10-CM | POA: Diagnosis not present

## 2012-05-29 DIAGNOSIS — I2699 Other pulmonary embolism without acute cor pulmonale: Secondary | ICD-10-CM

## 2012-05-29 DIAGNOSIS — G4733 Obstructive sleep apnea (adult) (pediatric): Secondary | ICD-10-CM | POA: Diagnosis not present

## 2012-05-29 DIAGNOSIS — J3489 Other specified disorders of nose and nasal sinuses: Secondary | ICD-10-CM

## 2012-05-29 MED ORDER — RIZATRIPTAN BENZOATE 10 MG PO TABS: 10.0000 mg | ORAL_TABLET | ORAL | Status: DC | PRN

## 2012-05-29 NOTE — Progress Notes (Signed)
OFFICE NOTE  06/01/2012  CC:  Chief Complaint  Patient presents with  . Headache    since Monday; question sinus     HPI: Patient is a 42 y.o. Caucasian male who is here for headaches.  Describes hx of similar HAs in the past but could always take BC powder and this would make HA resolve.  Now he is on coumadin and cannot take BC powder, so he has had a HA for 3d.  Describes the headache starting of with slight vision abnormalities--a few spots in visiul field and slight blurriness, then HA began and has been throbbing and severe at times but has let up at times and only been moderate intensity.  +Nausea, no vomiting.  +Photo, +phonophobia.  No dizziness.   His pain often intensifies in his paranasal sinus areas.  He denies significant nasal congestion or mucous or sneezing.  No upper teeth pain, no fever, no facial swelling, no PND.  No fever.  Of note, in the past we have suspected OSA and have ordered a sleep study and he finally got this scheduled for 06/13/12.    Regarding recent PEs, he has seen the hematologist about his + lupus anticoagulant, and the suspicion is that this was a false positive and that he'll only need the standard 60mo of coumadin as long as CT chest at the 60mo mark shows no significant clot burden in lungs anymore.  He is due for PT/INR today.  Pertinent PMH:  Past Medical History  Diagnosis Date  . Hiatal hernia   . GERD (gastroesophageal reflux disease)   . IBS (irritable bowel syndrome)   . Diverticulosis   . Neck pain, chronic   . Back pain, chronic   . Anxiety   . Kidney stones 2005  . Erosive esophagitis     Grade A  . COLONIC POLYPS, HYPERPLASTIC 08/05/2007    Qualifier: Diagnosis of  By: Misty Stanley CMA (AAMA), Marchelle Folks    . ESOPHAGITIS, HX OF 12/15/2007    Qualifier: Diagnosis of  By: Burnadette Pop  MD, Trisha Mangle    . Pulmonary emboli 01/2012    MEDS:  Outpatient Prescriptions Prior to Visit  Medication Sig Dispense Refill  . fluticasone (FLONASE) 50  MCG/ACT nasal spray Place 2 sprays into the nose daily.  16 g  6  . omeprazole (PRILOSEC) 20 MG capsule Take 1 capsule (20 mg total) by mouth 2 (two) times daily.  60 capsule  5  . oxyCODONE-acetaminophen (PERCOCET/ROXICET) 5-325 MG per tablet Take 1 tablet by mouth every 6 (six) hours as needed. For pain      . warfarin (COUMADIN) 5 MG tablet Take 2 tablets (10 mg total) by mouth daily.  60 tablet  3  . warfarin (COUMADIN) 7.5 MG tablet Take 1 tablet (7.5 mg total) by mouth daily.  30 tablet  3  . alclomethasone (ACLOVATE) 0.05 % cream Apply topically 2 (two) times daily. Apply qd-bid to affected areas prn  45 g  1  . citalopram (CELEXA) 20 MG tablet TAKE 1 TABLET BY MOUTH EVERY DAY  30 tablet  3  . cyclobenzaprine (FLEXERIL) 10 MG tablet Take 10 mg by mouth at bedtime as needed. For muscle spasm       . guaiFENesin-dextromethorphan (ROBITUSSIN DM) 100-10 MG/5ML syrup Take 5 mLs by mouth every 4 (four) hours as needed for cough.  118 mL  0  . ibuprofen (ADVIL,MOTRIN) 800 MG tablet Take 1 tablet (800 mg total) by mouth every 8 (eight) hours as needed  for pain.  45 tablet  1  Last reviewed on 05/29/2012  2:31 PM by [name], MD  PE: Blood pressure 109/79, pulse 77, height 5\' 9"  (1.753 m), weight 222 lb (100.699 kg). Gen: Alert, well appearing.  Patient is oriented to person, place, time, and situation. AFFECT: pleasant, lucid thought and speech. ENT: Ears: EACs clear, normal epithelium.  TMs with good light reflex and landmarks bilaterally.  Eyes: no injection, icteris, swelling, or exudate.  EOMI, PERRLA. Nose: no drainage or turbinate edema/swelling.  No injection or focal lesion.  He has some mild sensitivity to palpation of the paranasal sinuses.  Mouth: lips without lesion/swelling.  Oral mucosa pink and moist.  Dentition intact and without obvious caries or gingival swelling.  Oropharynx without erythema, exudate, or swelling.  Neck: supple/nontender.  No LAD, mass, or TM.  Carotid  pulses 2+ bilaterally, without bruits. CV: RRR, no m/r/g.   LUNGS: CTA bilat, nonlabored resps, good aeration in all lung fields. EXT: no clubbing, cyanosis, or edema.  Neuro: CN 2-12 intact bilaterally, strength 5/5 in proximal and distal upper extremities and lower extremities bilaterally.  No sensory deficits.  No tremor.  No disdiadochokinesis.  No ataxia.  Upper extremity and lower extremity DTRs symmetric.  No pronator drift.  IMPRESSION AND PLAN:  Migraine headache without aura Maxalt 10mg  trial. Therapeutic expectations and side effect profile of medication discussed today.  Patient's questions answered.   Sinus pain I feel like this is likely related to his migraine HA's and not indicative of sinus disease. However, will evaluate further with some plain films of his sinuses.  OSA (obstructive sleep apnea) Sleep study scheduled for 06/13/12.  Pulmonary embolism Check PT/INR today.   An After Visit Summary was printed and given to the patient.  FOLLOW UP: 1 mo

## 2012-05-30 ENCOUNTER — Other Ambulatory Visit: Payer: Self-pay | Admitting: Family Medicine

## 2012-05-31 DIAGNOSIS — G43009 Migraine without aura, not intractable, without status migrainosus: Secondary | ICD-10-CM | POA: Insufficient documentation

## 2012-05-31 DIAGNOSIS — J3489 Other specified disorders of nose and nasal sinuses: Secondary | ICD-10-CM | POA: Insufficient documentation

## 2012-05-31 NOTE — Assessment & Plan Note (Signed)
Maxalt 10mg  trial. Therapeutic expectations and side effect profile of medication discussed today.  Patient's questions answered.

## 2012-05-31 NOTE — Assessment & Plan Note (Signed)
I feel like this is likely related to his migraine HA's and not indicative of sinus disease. However, will evaluate further with some plain films of his sinuses.

## 2012-06-01 NOTE — Assessment & Plan Note (Signed)
Sleep study scheduled for 06/13/12.

## 2012-06-01 NOTE — Assessment & Plan Note (Signed)
Check PT/INR today 

## 2012-06-02 NOTE — Telephone Encounter (Signed)
eScribe request for refill on CITALOPRAM Last filled - 02/13/12, #30 X 3  Last seen on - 05/29/12 Follow up - 06/30/12 Refill sent per [facility] refill protocol.

## 2012-06-04 ENCOUNTER — Ambulatory Visit
Admission: RE | Admit: 2012-06-04 | Discharge: 2012-06-04 | Disposition: A | Discharge status: Home / Self Care | Payer: 59 | Source: Ambulatory Visit | Attending: Family Medicine | Admitting: Family Medicine

## 2012-06-04 DIAGNOSIS — J3489 Other specified disorders of nose and nasal sinuses: Secondary | ICD-10-CM

## 2012-06-10 DIAGNOSIS — M542 Cervicalgia: Secondary | ICD-10-CM | POA: Diagnosis not present

## 2012-06-10 DIAGNOSIS — M5137 Other intervertebral disc degeneration, lumbosacral region: Secondary | ICD-10-CM | POA: Diagnosis not present

## 2012-06-10 DIAGNOSIS — M546 Pain in thoracic spine: Secondary | ICD-10-CM | POA: Diagnosis not present

## 2012-06-10 DIAGNOSIS — M545 Low back pain: Secondary | ICD-10-CM | POA: Diagnosis not present

## 2012-06-11 ENCOUNTER — Telehealth: Payer: Self-pay | Admitting: *Deleted

## 2012-06-11 NOTE — Telephone Encounter (Signed)
PC from wife.  Maxalt not working.  Pt and wife wondering if any other -triptan that Trentan could take while on coumadin.  If possible, would prefer something not tier 3.

## 2012-06-12 MED ORDER — SUMATRIPTAN SUCCINATE 100 MG PO TABS: ORAL_TABLET | ORAL | Status: DC

## 2012-06-12 NOTE — Telephone Encounter (Signed)
Pt notified . He will call back if this does not work.

## 2012-06-12 NOTE — Telephone Encounter (Signed)
Generic imitrex sent to pharmacy.  There are several other triptans to try as well, if needed.--thx

## 2012-06-15 ENCOUNTER — Ambulatory Visit (HOSPITAL_BASED_OUTPATIENT_CLINIC_OR_DEPARTMENT_OTHER): Payer: 59 | Attending: Family Medicine | Admitting: Radiology

## 2012-06-15 VITALS — Ht 69.0 in | Wt 222.0 lb

## 2012-06-15 DIAGNOSIS — G4733 Obstructive sleep apnea (adult) (pediatric): Secondary | ICD-10-CM | POA: Diagnosis not present

## 2012-06-25 ENCOUNTER — Encounter: Payer: Self-pay | Admitting: Family Medicine

## 2012-06-25 ENCOUNTER — Telehealth: Payer: Self-pay | Admitting: Family Medicine

## 2012-06-25 DIAGNOSIS — G4733 Obstructive sleep apnea (adult) (pediatric): Secondary | ICD-10-CM

## 2012-06-25 DIAGNOSIS — G4730 Sleep apnea, unspecified: Secondary | ICD-10-CM

## 2012-06-25 DIAGNOSIS — G4710 Hypersomnia, unspecified: Secondary | ICD-10-CM | POA: Diagnosis not present

## 2012-06-25 NOTE — Procedures (Cosign Needed)
NAMEJUEL, BELLEROSE NO.:  [id number]  MEDICAL RECORD NO.:  [id number]          PATIENT TYPE:  OUT  LOCATION:  SLEEP CENTER                 FACILITY[facility]  [facility]  PHYSICIAN:  Barbaraann Share, MD,FCCPDATE OF BIRTH:  October 29, 1972  DATE OF STUDY:  06/15/2012                           NOCTURNAL POLYSOMNOGRAM  REFERRING PHYSICIAN:  [name], MD  INDICATION FOR STUDY:  Hypersomnia with sleep apnea.  EPWORTH SLEEPINESS SCORE:  2.  MEDICATIONS:  SLEEP ARCHITECTURE:  The patient had a total sleep time of 299 minutes with decreased slow-wave sleep for age and only 37 minutes of REM. Sleep onset latency was prolonged at 36 minutes and REM onset was very prolonged at 320 minutes.  Sleep efficiency was moderately reduced at 76%.  RESPIRATORY DATA:  The patient was found to have 8 apneas and 121 obstructive hypopneas, giving him an apnea-hypopnea index of 26 events per hour.  The events occurred primarily in the supine position, and there was loud snoring noted throughout.  OXYGEN DATA:  There was O2 desaturation as low as 83% with the patient's obstructive events.  CARDIAC DATA:  No clinically significant arrhythmias were seen.  MOVEMENT-PARASOMNIA:  The patient had no significant leg jerks or other abnormal behaviors noted.  IMPRESSIONS-RECOMMENDATIONS: 1. Moderate obstructive sleep apnea/hypopnea syndrome, with an AHI of     26 events per hour and oxygen desaturation as low as 83%.     Treatment for this degree of sleep apnea can include a trial of     weight loss alone, upper airway surgery, dental appliance, and also     CPAP.  Clinical correlation is suggested. 2. The patient did have occasional findings during the night     suggestive of bruxism.  Again, clinical correlation is suggested.    Barbaraann Share, MD,FCCP Diplomate, American Board of Sleep Medicine   [facility]/MEDQ  D:  06/25/2012 08:52:58  T:  06/25/2012 09:06:04  Job:  [id number]

## 2012-06-25 NOTE — Telephone Encounter (Signed)
Pt scheduled for Friday, 06/27/12.  Pt notified.  i will call patient back to let him know what time to arrive and if he will have to stay all night.  No answer at sleep center when I call back.

## 2012-06-25 NOTE — Telephone Encounter (Signed)
Pls notify pt that his sleep study did confirm significant obstructive sleep apnea and I recommend he use CPAP. Pls arrange CPAP titration at [facility] sleep center.-thx

## 2012-06-25 NOTE — Telephone Encounter (Signed)
Pt notified and is agreeable.  Able to have appt any night but Thursday.  No answer at [facility]- sleep center.

## 2012-06-27 ENCOUNTER — Encounter (HOSPITAL_BASED_OUTPATIENT_CLINIC_OR_DEPARTMENT_OTHER): Payer: 59

## 2012-06-27 NOTE — Telephone Encounter (Signed)
Message left on voicemail that per [name] in sleep center, pt will arrive at 8pm and would most likely leave between 5 and 6 AM.  Pt advised to call me with any questions or call [facility] sleep center with questions or to reschedule.  Their number given.

## 2012-06-28 ENCOUNTER — Emergency Department (HOSPITAL_BASED_OUTPATIENT_CLINIC_OR_DEPARTMENT_OTHER)
Admission: EM | Admit: 2012-06-28 | Discharge: 2012-06-29 | Disposition: A | Discharge status: Home / Self Care | Payer: 59 | Attending: Emergency Medicine | Admitting: Emergency Medicine

## 2012-06-28 ENCOUNTER — Encounter (HOSPITAL_BASED_OUTPATIENT_CLINIC_OR_DEPARTMENT_OTHER): Payer: Self-pay | Admitting: *Deleted

## 2012-06-28 DIAGNOSIS — G4733 Obstructive sleep apnea (adult) (pediatric): Secondary | ICD-10-CM | POA: Diagnosis not present

## 2012-06-28 DIAGNOSIS — Z86711 Personal history of pulmonary embolism: Secondary | ICD-10-CM | POA: Insufficient documentation

## 2012-06-28 DIAGNOSIS — N489 Disorder of penis, unspecified: Secondary | ICD-10-CM | POA: Insufficient documentation

## 2012-06-28 DIAGNOSIS — R369 Urethral discharge, unspecified: Secondary | ICD-10-CM | POA: Diagnosis not present

## 2012-06-28 DIAGNOSIS — Z79899 Other long term (current) drug therapy: Secondary | ICD-10-CM | POA: Diagnosis not present

## 2012-06-28 DIAGNOSIS — Z9889 Other specified postprocedural states: Secondary | ICD-10-CM | POA: Diagnosis not present

## 2012-06-28 DIAGNOSIS — F411 Generalized anxiety disorder: Secondary | ICD-10-CM | POA: Diagnosis not present

## 2012-06-28 DIAGNOSIS — N390 Urinary tract infection, site not specified: Secondary | ICD-10-CM | POA: Insufficient documentation

## 2012-06-28 DIAGNOSIS — Z8719 Personal history of other diseases of the digestive system: Secondary | ICD-10-CM | POA: Insufficient documentation

## 2012-06-28 DIAGNOSIS — R300 Dysuria: Secondary | ICD-10-CM | POA: Insufficient documentation

## 2012-06-28 DIAGNOSIS — Z87442 Personal history of urinary calculi: Secondary | ICD-10-CM | POA: Insufficient documentation

## 2012-06-28 DIAGNOSIS — Z87891 Personal history of nicotine dependence: Secondary | ICD-10-CM | POA: Diagnosis not present

## 2012-06-28 DIAGNOSIS — Z860100 Personal history of colon polyps, unspecified: Secondary | ICD-10-CM | POA: Insufficient documentation

## 2012-06-28 DIAGNOSIS — G8929 Other chronic pain: Secondary | ICD-10-CM | POA: Insufficient documentation

## 2012-06-28 DIAGNOSIS — IMO0002 Reserved for concepts with insufficient information to code with codable children: Secondary | ICD-10-CM | POA: Insufficient documentation

## 2012-06-28 DIAGNOSIS — K219 Gastro-esophageal reflux disease without esophagitis: Secondary | ICD-10-CM | POA: Diagnosis not present

## 2012-06-28 DIAGNOSIS — Z7901 Long term (current) use of anticoagulants: Secondary | ICD-10-CM | POA: Diagnosis not present

## 2012-06-28 LAB — URINALYSIS, ROUTINE W REFLEX MICROSCOPIC
Bilirubin Urine: NEGATIVE
Nitrite: NEGATIVE
Protein, ur: NEGATIVE mg/dL
Urobilinogen, UA: 0.2 mg/dL (ref 0.0–1.0)

## 2012-06-28 LAB — URINE MICROSCOPIC-ADD ON

## 2012-06-28 MED ORDER — CEPHALEXIN 500 MG PO CAPS: 500.0000 mg | ORAL_CAPSULE | Freq: Two times a day (BID) | ORAL | Status: DC

## 2012-06-28 NOTE — ED Provider Notes (Signed)
History     CSN: [id number]  Arrival date & time 06/28/12  2213   First MD Initiated Contact with Patient 06/28/12 2234      Chief Complaint  Patient presents with  . pain to groin area.     (Consider location/radiation/quality/duration/timing/severity/associated sxs/prior treatment) HPI [name] is a 42 y.o. male who presents with complaint of burining with urination and penile discharge that is white in color. States symptoms started yesterday. States pain and burning to tip of the penis. No prior hx of the same. States urine is normal, no blood in urine. States did noted some white discharge prior to urinating just now in ED. No fever, chills. Only one sexual partner for 12 years. No abdominal pain. No n/v.     Past Medical History  Diagnosis Date  . Hiatal hernia   . GERD (gastroesophageal reflux disease)   . IBS (irritable bowel syndrome)   . Diverticulosis   . Neck pain, chronic   . Back pain, chronic   . Anxiety   . Kidney stones 2005  . Erosive esophagitis     Grade A  . COLONIC POLYPS, HYPERPLASTIC 08/05/2007    Qualifier: Diagnosis of  By: Misty Stanley CMA (AAMA), Marchelle Folks    . ESOPHAGITIS, HX OF 12/15/2007    Qualifier: Diagnosis of  By: Burnadette Pop  MD, Trisha Mangle    . Pulmonary emboli 01/2012  . OSA (obstructive sleep apnea) 05/2012    PSG 05/2012, AHI 27/hr, sat nadir 83%.   CPAP titration ordered.    Past Surgical History  Procedure Date  . Kidney stone surgery     removal  . Mandible surgery     cyst removal  . Upper gastrointestinal endoscopy   . Colonoscopy   . Polypectomy     Family History  Problem Relation Age of Onset  . Diabetes Mother   . Colon cancer Neg Hx   . Diabetes Maternal Grandfather     Insulin dependent  . Heart attack Maternal Grandmother     History  Substance Use Topics  . Smoking status: Former Smoker -- 0.2 packs/day for 1 years    Types: Cigarettes  . Smokeless tobacco: Never Used  . Alcohol Use: 0.5 oz/week    1 drink(s)  per week     Comment: rarely, 3 per month      Review of Systems  Constitutional: Negative for fever and chills.  HENT: Negative for neck pain and neck stiffness.   Respiratory: Negative.   Cardiovascular: Negative.   Gastrointestinal: Negative.   Genitourinary: Positive for dysuria, discharge and penile pain. Negative for frequency, penile swelling, scrotal swelling, difficulty urinating, genital sores and testicular pain.  Skin: Negative.   Neurological: Negative for weakness and numbness.    Allergies  Vancomycin; Clarithromycin; and Gabapentin  Home Medications   Current Outpatient Rx  Name  Route  Sig  Dispense  Refill  . ALCLOMETASONE DIPROPIONATE 0.05 % EX CREA   Topical   Apply topically 2 (two) times daily. Apply qd-bid to affected areas prn   45 g   1   . CITALOPRAM HYDROBROMIDE 20 MG PO TABS      TAKE 1 TABLET BY MOUTH EVERY DAY   30 tablet   2   . CYCLOBENZAPRINE HCL 10 MG PO TABS   Oral   Take 10 mg by mouth at bedtime as needed. For muscle spasm          . FLUTICASONE PROPIONATE 50 MCG/ACT NA SUSP  Nasal   Place 2 sprays into the nose daily.   16 g   6   . GUAIFENESIN-DM 100-10 MG/5ML PO SYRP   Oral   Take 5 mLs by mouth every 4 (four) hours as needed for cough.   118 mL   0   . IBUPROFEN 800 MG PO TABS   Oral   Take 1 tablet (800 mg total) by mouth every 8 (eight) hours as needed for pain.   45 tablet   1   . OMEPRAZOLE 20 MG PO CPDR   Oral   Take 1 capsule (20 mg total) by mouth 2 (two) times daily.   60 capsule   5   . OXYCODONE-ACETAMINOPHEN 5-325 MG PO TABS   Oral   Take 1 tablet by mouth every 6 (six) hours as needed. For pain         . SUMATRIPTAN SUCCINATE 100 MG PO TABS      1 tab po qd as needed for headache; may repeat dose in 2 hours if needed.  Max in 24 h is 200 mg.   9 tablet   1   . WARFARIN SODIUM 5 MG PO TABS   Oral   Take 2 tablets (10 mg total) by mouth daily.   60 tablet   3   . WARFARIN SODIUM 7.5  MG PO TABS   Oral   Take 1 tablet (7.5 mg total) by mouth daily.   30 tablet   3     BP 130/78  Pulse 105  Temp 98 F (36.7 C) (Oral)  Resp 20  Ht 5\' 9"  (1.753 m)  Wt 220 lb (99.791 kg)  BMI 32.49 kg/m2  SpO2 97%  Physical Exam  Nursing note and vitals reviewed. Constitutional: He appears well-developed and well-nourished. No distress.  Cardiovascular: Normal rate, regular rhythm and normal heart sounds.   Pulmonary/Chest: Effort normal and breath sounds normal. No respiratory distress. He has no wheezes. He has no rales.  Genitourinary: Testes normal and penis normal. Circumcised. No penile erythema. No discharge found.  Neurological: He is alert.  Skin: Skin is warm and dry.    ED Course  Procedures (including critical care time)  Normal genital exam. No discharge. Will get UA.  Results for orders placed during the hospital encounter of 06/28/12  URINALYSIS, ROUTINE W REFLEX MICROSCOPIC      Component Value Range   Color, Urine YELLOW  YELLOW   APPearance CLEAR  CLEAR   Specific Gravity, Urine 1.021  1.005 - 1.030   pH 5.5  5.0 - 8.0   Glucose, UA NEGATIVE  NEGATIVE mg/dL   Hgb urine dipstick MODERATE (*) NEGATIVE   Bilirubin Urine NEGATIVE  NEGATIVE   Ketones, ur NEGATIVE  NEGATIVE mg/dL   Protein, ur NEGATIVE  NEGATIVE mg/dL   Urobilinogen, UA 0.2  0.0 - 1.0 mg/dL   Nitrite NEGATIVE  NEGATIVE   Leukocytes, UA SMALL (*) NEGATIVE  URINE MICROSCOPIC-ADD ON      Component Value Range   Squamous Epithelial / LPF RARE  RARE   WBC, UA 11-20  <3 WBC/hpf   RBC / HPF 3-6  <3 RBC/hpf   Bacteria, UA FEW (*) RARE   Urine-Other MUCOUS PRESENT        1. UTI (lower urinary tract infection)       MDM  Pt with urinary symptoms. No discharge or abnormalities noted on physical exam. UA infected. Pt denies having STD, States knows that is not what  is causing his symptoms, refusing treatment at this time. GC and chlamydia obtained, will send for cultures. Urine culture  sent as well. Will start on keflex for now. Follow up as needed. Pt otherwise non toxic. No distress.   Filed Vitals:   06/28/12 2225  BP: 130/78  Pulse: 105  Temp: 98 F (36.7 C)  Resp: 20          Bhargav Barbaro A Raffaela Ladley, PA 06/29/12 0002

## 2012-06-28 NOTE — ED Notes (Signed)
MD at bedside. 
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
## 2012-06-29 NOTE — ED Provider Notes (Signed)
Medical screening examination/treatment/procedure(s) were performed by non-physician practitioner and as supervising physician I was immediately available for consultation/collaboration.   Christopher J. Pollina, MD 06/29/12 1550 

## 2012-06-30 ENCOUNTER — Ambulatory Visit: Payer: 59 | Admitting: Family Medicine

## 2012-06-30 LAB — URINE CULTURE

## 2012-07-07 ENCOUNTER — Encounter (HOSPITAL_BASED_OUTPATIENT_CLINIC_OR_DEPARTMENT_OTHER): Payer: 59

## 2012-07-14 ENCOUNTER — Other Ambulatory Visit: Payer: Self-pay | Admitting: *Deleted

## 2012-07-14 MED ORDER — WARFARIN SODIUM 7.5 MG PO TABS: 7.5000 mg | ORAL_TABLET | Freq: Every day | ORAL | Status: DC

## 2012-07-14 NOTE — Telephone Encounter (Signed)
Faxed refill request received from pharmacy for COUMADIN 7.5MG  Last filled by MD on 03/13/12, #30 X 3 Last INR on 05/29/12 Follow up INR in one month.  Pt has not had INR check.  PC to patient.  Advised I would call in refill, but he is due for INR check.  He will come on Wednesday.  Refill sent.

## 2012-07-14 NOTE — Addendum Note (Signed)
Addended by: Luisa Dago on: 07/14/2012 03:16 PM   Modules accepted: Orders

## 2012-07-14 NOTE — Telephone Encounter (Signed)
RX printed instead of sending electronic.  RX resent.

## 2012-07-16 ENCOUNTER — Other Ambulatory Visit: Payer: 59

## 2012-07-17 ENCOUNTER — Ambulatory Visit: Payer: 59 | Admitting: Family Medicine

## 2012-07-21 ENCOUNTER — Other Ambulatory Visit (INDEPENDENT_AMBULATORY_CARE_PROVIDER_SITE_OTHER): Payer: 59

## 2012-07-21 DIAGNOSIS — I2699 Other pulmonary embolism without acute cor pulmonale: Secondary | ICD-10-CM

## 2012-07-21 LAB — PROTIME-INR: INR: 3.3 ratio — ABNORMAL HIGH (ref 0.8–1.0)

## 2012-07-21 NOTE — Progress Notes (Signed)
Labs only

## 2012-07-26 DIAGNOSIS — M67912 Unspecified disorder of synovium and tendon, left shoulder: Secondary | ICD-10-CM

## 2012-07-26 HISTORY — DX: Unspecified disorder of synovium and tendon, left shoulder: M67.912

## 2012-07-29 ENCOUNTER — Encounter (HOSPITAL_BASED_OUTPATIENT_CLINIC_OR_DEPARTMENT_OTHER): Payer: 59

## 2012-07-31 DIAGNOSIS — M25519 Pain in unspecified shoulder: Secondary | ICD-10-CM | POA: Diagnosis not present

## 2012-08-04 ENCOUNTER — Other Ambulatory Visit (INDEPENDENT_AMBULATORY_CARE_PROVIDER_SITE_OTHER): Payer: 59

## 2012-08-04 ENCOUNTER — Other Ambulatory Visit: Payer: Self-pay | Admitting: Orthopaedic Surgery

## 2012-08-04 DIAGNOSIS — I2699 Other pulmonary embolism without acute cor pulmonale: Secondary | ICD-10-CM

## 2012-08-04 DIAGNOSIS — M25512 Pain in left shoulder: Secondary | ICD-10-CM

## 2012-08-04 LAB — PROTIME-INR
INR: 1.9 ratio — ABNORMAL HIGH (ref 0.8–1.0)
Prothrombin Time: 20.3 s — ABNORMAL HIGH (ref 10.2–12.4)

## 2012-08-04 NOTE — Progress Notes (Signed)
Labs only

## 2012-08-05 ENCOUNTER — Other Ambulatory Visit: Payer: 59 | Admitting: Lab

## 2012-08-05 ENCOUNTER — Ambulatory Visit (HOSPITAL_COMMUNITY)
Admission: RE | Admit: 2012-08-05 | Discharge: 2012-08-05 | Disposition: A | Discharge status: Home / Self Care | Payer: 59 | Source: Ambulatory Visit | Attending: Oncology | Admitting: Oncology

## 2012-08-05 ENCOUNTER — Other Ambulatory Visit: Payer: Self-pay | Admitting: Orthopaedic Surgery

## 2012-08-05 ENCOUNTER — Other Ambulatory Visit: Payer: Self-pay | Admitting: Oncology

## 2012-08-05 DIAGNOSIS — I2699 Other pulmonary embolism without acute cor pulmonale: Secondary | ICD-10-CM

## 2012-08-05 DIAGNOSIS — M25512 Pain in left shoulder: Secondary | ICD-10-CM

## 2012-08-05 DIAGNOSIS — R079 Chest pain, unspecified: Secondary | ICD-10-CM | POA: Diagnosis not present

## 2012-08-05 MED ORDER — IOHEXOL 350 MG/ML SOLN
100.0000 mL | Freq: Once | INTRAVENOUS | Status: AC | PRN
  Administered 2012-08-05: 100 mL via INTRAVENOUS

## 2012-08-06 LAB — LUPUS ANTICOAGULANT PANEL
PTT Lupus Anticoagulant: 46.1 secs — ABNORMAL HIGH (ref 28.0–43.0)
PTTLA 4:1 Mix: 42.9 secs (ref 28.0–43.0)

## 2012-08-12 ENCOUNTER — Ambulatory Visit (HOSPITAL_BASED_OUTPATIENT_CLINIC_OR_DEPARTMENT_OTHER): Payer: 59 | Admitting: Oncology

## 2012-08-12 ENCOUNTER — Telehealth: Payer: Self-pay | Admitting: Family Medicine

## 2012-08-12 VITALS — BP 120/80 | HR 76 | Temp 97.6°F | Resp 20 | Ht 69.0 in | Wt 228.5 lb

## 2012-08-12 DIAGNOSIS — Z7901 Long term (current) use of anticoagulants: Secondary | ICD-10-CM

## 2012-08-12 DIAGNOSIS — I2699 Other pulmonary embolism without acute cor pulmonale: Secondary | ICD-10-CM | POA: Diagnosis not present

## 2012-08-12 DIAGNOSIS — Z86718 Personal history of other venous thrombosis and embolism: Secondary | ICD-10-CM

## 2012-08-12 NOTE — Telephone Encounter (Signed)
Spoke with patient RE: general information that he was needing to speak with provider about. Patient would like to update provider on information RE: "visit with medical specialist seen for blood clots" and also if he can begin Ibuprofen Rx again, and if so, he needs new Rx/SLS Please advise.
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
## 2012-08-13 ENCOUNTER — Ambulatory Visit
Admission: RE | Admit: 2012-08-13 | Discharge: 2012-08-13 | Disposition: A | Discharge status: Home / Self Care | Payer: 59 | Source: Ambulatory Visit | Attending: Orthopaedic Surgery | Admitting: Orthopaedic Surgery

## 2012-08-13 DIAGNOSIS — M25512 Pain in left shoulder: Secondary | ICD-10-CM

## 2012-08-14 ENCOUNTER — Other Ambulatory Visit: Payer: Self-pay | Admitting: Family Medicine

## 2012-08-14 MED ORDER — IBUPROFEN 800 MG PO TABS: 800.0000 mg | ORAL_TABLET | Freq: Three times a day (TID) | ORAL | Status: DC | PRN

## 2012-08-14 NOTE — Telephone Encounter (Signed)
Spoke with pt. Told him it was fine to restart ibuprofen now--new rx called in.

## 2012-08-18 ENCOUNTER — Ambulatory Visit (HOSPITAL_BASED_OUTPATIENT_CLINIC_OR_DEPARTMENT_OTHER): Payer: 59

## 2012-08-18 DIAGNOSIS — M25519 Pain in unspecified shoulder: Secondary | ICD-10-CM | POA: Diagnosis not present

## 2012-08-27 ENCOUNTER — Telehealth: Payer: Self-pay | Admitting: Family Medicine

## 2012-08-27 DIAGNOSIS — G4733 Obstructive sleep apnea (adult) (pediatric): Secondary | ICD-10-CM

## 2012-08-27 NOTE — Telephone Encounter (Signed)
Pt notified of results.  He is agreeable.  He states he has appt for titration with [facility] already (he will need to R/S due to conflicts).  Pt would prefer home titration if possible.  I called [facility] and they do not do home titrations.  [facility] will do home study with recent sleep study.

## 2012-08-27 NOTE — Telephone Encounter (Signed)
Home CPAP titration through [facility] ordered.

## 2012-08-27 NOTE — Telephone Encounter (Signed)
Pls call pt and notify him that his sleep study showed moderate OSA and I recommend a trial of CPAP. If he agrees, let me know and I'll enter order for CPAP titration study.-thx

## 2012-09-03 NOTE — Telephone Encounter (Signed)
Ranen notified study results and OV note discussing OSA have been faxed to [facility] per Candace's request.  He spoke with her yesterday.  Advised if he has not had a follow up call from them by Friday to call Candace or me.  He is agreeable.

## 2012-09-22 ENCOUNTER — Other Ambulatory Visit: Payer: Self-pay | Admitting: *Deleted

## 2012-09-22 MED ORDER — CITALOPRAM HYDROBROMIDE 20 MG PO TABS: ORAL_TABLET | ORAL | Status: DC

## 2012-09-22 NOTE — Telephone Encounter (Signed)
Rx request to pharmacy; same as last refill/SLS

## 2012-09-24 ENCOUNTER — Other Ambulatory Visit: Payer: Self-pay | Admitting: *Deleted

## 2012-09-24 MED ORDER — OMEPRAZOLE 20 MG PO CPDR: 20.0000 mg | DELAYED_RELEASE_CAPSULE | Freq: Two times a day (BID) | ORAL | Status: DC

## 2012-09-24 NOTE — Telephone Encounter (Signed)
Rx request to pharmacy/SLS  

## 2012-11-19 ENCOUNTER — Other Ambulatory Visit: Payer: Self-pay | Admitting: Family Medicine

## 2012-11-19 NOTE — Telephone Encounter (Signed)
Patient requesting citalopram refill.  Patient did not keep upcoming appointment.  Medication denied.  Patient needs to make an appointment.

## 2012-11-24 ENCOUNTER — Encounter: Payer: Self-pay | Admitting: Family Medicine

## 2012-11-24 ENCOUNTER — Ambulatory Visit: Payer: 59 | Admitting: Family Medicine

## 2012-11-25 ENCOUNTER — Ambulatory Visit: Payer: 59 | Admitting: Family Medicine

## 2012-11-26 ENCOUNTER — Ambulatory Visit: Payer: 59 | Admitting: Family Medicine

## 2012-12-01 ENCOUNTER — Ambulatory Visit: Payer: 59 | Admitting: Family Medicine

## 2012-12-16 ENCOUNTER — Ambulatory Visit (INDEPENDENT_AMBULATORY_CARE_PROVIDER_SITE_OTHER): Payer: 59 | Admitting: General Surgery

## 2012-12-16 ENCOUNTER — Encounter (INDEPENDENT_AMBULATORY_CARE_PROVIDER_SITE_OTHER): Payer: Self-pay | Admitting: General Surgery

## 2012-12-16 VITALS — BP 130/78 | HR 74 | Resp 16 | Ht 69.0 in | Wt 225.2 lb

## 2012-12-16 DIAGNOSIS — K645 Perianal venous thrombosis: Secondary | ICD-10-CM | POA: Insufficient documentation

## 2012-12-16 MED ORDER — HYDROCORTISONE 2.5 % RE CREA: TOPICAL_CREAM | Freq: Four times a day (QID) | RECTAL | Status: DC

## 2012-12-16 NOTE — Patient Instructions (Signed)
Soak a cottonball in witch hazel and apply to the area for 10 minutes 3 times a day. After this applied the Anusol HC. Warm water tub soaks 3 times a day. Avoid constipation. May take milk of magnesia or MiraLAX as needed for constipation. Call if the situation gets worse within the next 48 hours. It should take one to 2 weeks to completely go away.

## 2012-12-16 NOTE — Progress Notes (Signed)
Patient ID: [name], male   DOB: Jul 05, 1970, 42 y.o.   MRN: [id number]  Chief Complaint  Patient presents with  . New Evaluation    poss thrombosed hems    HPI [name] is a 42 y.o. male.   HPI   He is self-referred. He had some perianal swelling began about 48 hours ago after he got sick. He's had this before and had been seen in ourffice with a thrombosed external hemorrhoid. He started putting Preparation H on the area and it is not quite as swollen as it used to be.   Past Medical History  Diagnosis Date  . Hiatal hernia   . GERD (gastroesophageal reflux disease)   . IBS (irritable bowel syndrome)   . Diverticulosis   . Neck pain, chronic   . Back pain, chronic   . Anxiety   . Kidney stones 2005  . Erosive esophagitis     Grade A  . COLONIC POLYPS, HYPERPLASTIC 08/05/2007    Qualifier: Diagnosis of  By: Misty Stanley CMA (AAMA), Marchelle Folks    . ESOPHAGITIS, HX OF 12/15/2007    Qualifier: Diagnosis of  By: Burnadette Pop  MD, Trisha Mangle    . Pulmonary emboli 01/2012  . OSA (obstructive sleep apnea) 05/2012    PSG 05/2012, AHI 27/hr, sat nadir 83%.   CPAP titration ordered.    Past Surgical History  Procedure Laterality Date  . Kidney stone surgery      removal  . Mandible surgery      cyst removal  . Upper gastrointestinal endoscopy    . Colonoscopy    . Polypectomy      Family History  Problem Relation Age of Onset  . Diabetes Mother   . Colon cancer Neg Hx   . Diabetes Maternal Grandfather     Insulin dependent  . Heart attack Maternal Grandmother     Social History History  Substance Use Topics  . Smoking status: Former Smoker -- 0.25 packs/day for 1 years    Types: Cigarettes  . Smokeless tobacco: Never Used  . Alcohol Use: 0.5 oz/week    1 drink(s) per week     Comment: rarely, 3 per month    Allergies  Allergen Reactions  . Vancomycin     "red man syndrome"  . Clarithromycin     REACTION: nausea and tremor  . Gabapentin     REACTION: nausea,  dizziness    Current Outpatient Prescriptions  Medication Sig Dispense Refill  . citalopram (CELEXA) 20 MG tablet TAKE 1 TABLET BY MOUTH EVERY DAY  30 tablet  2  . cyclobenzaprine (FLEXERIL) 10 MG tablet Take 10 mg by mouth at bedtime as needed. For muscle spasm       . fluticasone (FLONASE) 50 MCG/ACT nasal spray Place 2 sprays into the nose daily.  16 g  6  . ibuprofen (ADVIL,MOTRIN) 800 MG tablet Take 1 tablet (800 mg total) by mouth every 8 (eight) hours as needed for pain.  45 tablet  5  . omeprazole (PRILOSEC) 20 MG capsule Take 1 capsule (20 mg total) by mouth 2 (two) times daily.  60 capsule  5  . oxyCODONE-acetaminophen (PERCOCET/ROXICET) 5-325 MG per tablet Take 1 tablet by mouth every 6 (six) hours as needed. For pain      . hydrocortisone (ANUSOL-HC) 2.5 % rectal cream Place rectally 4 (four) times daily. Apply on swollen anal area.  15 g  2  . [DISCONTINUED] pantoprazole (PROTONIX) 40 MG tablet Take 1 tablet (40  mg total) by mouth daily.  30 tablet  11  . [DISCONTINUED] pregabalin (LYRICA) 50 MG capsule Take 1 capsule (50 mg total) by mouth 3 (three) times daily.  90 capsule  2   No current facility-administered medications for this visit.    Review of Systems Review of Systems  Gastrointestinal: Positive for constipation and rectal pain.    Blood pressure 130/78, pulse 74, resp. rate 16, height 5\' 9"  (1.753 m), weight 225 lb 3.2 oz (102.15 kg).  Physical Exam Physical Exam  Constitutional: He appears well-developed and well-nourished. No distress.  Genitourinary:  Moderately soft, properly swelling right anterior lateral area consistent with thrombosed external hemorrhoid. Minimal if any tenderness. No bleeding.    Data Reviewed none  Assessment    Thrombosed external hemorrhoid right anterior lateral position. This is spontaneously resolving. We discussed incision and drainage versus aggressive medical management. He would prefer to aggressive medical management.      Plan    Anusol 2.5% HC cream to the area 4 times a day. Apply witch hazel to the area 3 times a day. Warm water to help soaks. Avoid constipation. Return if it does not improve.        Del Wiseman J 12/16/2012, 3:33 PM

## 2012-12-18 ENCOUNTER — Encounter: Payer: Self-pay | Admitting: Family Medicine

## 2012-12-19 ENCOUNTER — Ambulatory Visit (INDEPENDENT_AMBULATORY_CARE_PROVIDER_SITE_OTHER): Payer: 59 | Admitting: Family Medicine

## 2012-12-19 ENCOUNTER — Encounter: Payer: Self-pay | Admitting: Family Medicine

## 2012-12-19 VITALS — BP 124/86 | HR 67 | Temp 98.2°F | Resp 18 | Ht 69.0 in | Wt 228.0 lb

## 2012-12-19 DIAGNOSIS — R61 Generalized hyperhidrosis: Secondary | ICD-10-CM | POA: Diagnosis not present

## 2012-12-19 DIAGNOSIS — R232 Flushing: Secondary | ICD-10-CM

## 2012-12-19 LAB — CBC WITH DIFFERENTIAL/PLATELET
Basophils Relative: 0.5 % (ref 0.0–3.0)
Eosinophils Absolute: 0 10*3/uL (ref 0.0–0.7)
HCT: 46.2 % (ref 39.0–52.0)
Hemoglobin: 15.3 g/dL (ref 13.0–17.0)
Lymphocytes Relative: 44.3 % (ref 12.0–46.0)
MCHC: 33.2 g/dL (ref 30.0–36.0)
MCV: 91.4 fl (ref 78.0–100.0)
Monocytes Absolute: 0.7 10*3/uL (ref 0.1–1.0)
Neutro Abs: 2.9 10*3/uL (ref 1.4–7.7)
RBC: 5.05 Mil/uL (ref 4.22–5.81)

## 2012-12-19 LAB — BASIC METABOLIC PANEL
BUN: 15 mg/dL (ref 6–23)
GFR: 85.15 mL/min (ref >60.00)
Glucose, Bld: 84 mg/dL (ref 70–99)
Potassium: 4.5 mEq/L (ref 3.5–5.1)

## 2012-12-19 LAB — SEDIMENTATION RATE: Sed Rate: 7 mm/hr (ref 0–22)

## 2012-12-19 NOTE — Progress Notes (Signed)
OFFICE NOTE  12/19/2012  CC:  Chief Complaint  Patient presents with  . Back Pain  . Flank Pain     HPI: Patient is a 42 y.o. Caucasian male who is here for hot flashes/night sweats.  Has been a complaint of his before on and off, seemingly random periods, over the last >1 yr or so. Night sweats are a couple of nights a week--occ is woken up by the feeling of feeling very sweaty. Daytime he feels a flash of hot feeling all over, occ flushes and only occasionally actually breaks out into somewhat of a sweat.  No palpitations, no dizziness, no SOB, no presyncope, no anxiety associated with these episodes.  Says he doesn't feel like he is more anxious during the periods when he has these sx's more regularly.  Has had some blood tests for same sx's in the past and all were normal.    ROS: no vision complaints.  No headaches.  No chest pain.  NO muscle aches.  He has chronic low back pain--is going to go to the neurosurgeon again for this soon.   He does complain of 3d hx of hearing "a harley revving in my right ear" every time he feels like he has to urinate.  After urination the sound goes away.  He has been more congested in his nose the last few days.   Appetite is good.  No undesired wt loss.  No abd pain.  Bowels are "fine".  No dysuria, hematuria, urinary urgency, or urinary frequency.  No skin changes.  Pertinent PMH:  Past Medical History  Diagnosis Date  . Hiatal hernia   . GERD (gastroesophageal reflux disease)   . IBS (irritable bowel syndrome)   . Diverticulosis   . Neck pain, chronic   . Back pain, chronic     Multilevel lumbar spondylosis (age advanced) on MRI 08/2011  . Anxiety   . Kidney stones 2005  . Erosive esophagitis     Grade A  . COLONIC POLYPS, HYPERPLASTIC 08/05/2007    Qualifier: Diagnosis of  By: Misty Stanley CMA (AAMA), Marchelle Folks    . ESOPHAGITIS, HX OF 12/15/2007    Qualifier: Diagnosis of  By: Burnadette Pop  MD, Trisha Mangle    . Pulmonary emboli 01/2012  . OSA  (obstructive sleep apnea) 05/2012    PSG 05/2012, AHI 27/hr, sat nadir 83%.   CPAP titration ordered.  . Tendinopathy of left rotator cuff 07/2012    MRI: Dr. Magnus Ivan (ortho)   Past Surgical History  Procedure Laterality Date  . Kidney stone surgery      removal  . Mandible surgery      cyst removal  . Upper gastrointestinal endoscopy    . Colonoscopy    . Polypectomy     Past family and social history reviewed and there are no changes since the patient's last office visit with me.  MEDS:  Outpatient Prescriptions Prior to Visit  Medication Sig Dispense Refill  . citalopram (CELEXA) 20 MG tablet TAKE 1 TABLET BY MOUTH EVERY DAY  30 tablet  2  . cyclobenzaprine (FLEXERIL) 10 MG tablet Take 10 mg by mouth at bedtime as needed. For muscle spasm       . fluticasone (FLONASE) 50 MCG/ACT nasal spray Place 2 sprays into the nose daily.  16 g  6  . hydrocortisone (ANUSOL-HC) 2.5 % rectal cream Place rectally 4 (four) times daily. Apply on swollen anal area.  15 g  2  . ibuprofen (ADVIL,MOTRIN) 800 MG tablet  Take 1 tablet (800 mg total) by mouth every 8 (eight) hours as needed for pain.  45 tablet  5  . omeprazole (PRILOSEC) 20 MG capsule Take 1 capsule (20 mg total) by mouth 2 (two) times daily.  60 capsule  5  . oxyCODONE-acetaminophen (PERCOCET/ROXICET) 5-325 MG per tablet Take 1 tablet by mouth every 6 (six) hours as needed. For pain       No facility-administered medications prior to visit.    PE: Blood pressure 124/86, pulse 67, temperature 98.2 F (36.8 C), temperature source Temporal, resp. rate 18, height 5\' 9"  (1.753 m), weight 228 lb (103.42 kg), SpO2 97.00%. Gen: Alert, well appearing.  Patient is oriented to person, place, time, and situation. ENT: Ears: EACs clear, normal epithelium.  TMs with good light reflex and landmarks bilaterally.  Eyes: no injection, icteris, swelling, or exudate.  EOMI, PERRLA. Nose: no drainage or turbinate edema/swelling.  No injection or focal  lesion.  Mouth: lips without lesion/swelling.  Oral mucosa pink and moist.  Dentition intact and without obvious caries or gingival swelling.  Oropharynx without erythema, exudate, or swelling.  Neck - No masses or thyromegaly or limitation in range of motion CV: RRR, no m/r/g.   LUNGS: CTA bilat, nonlabored resps, good aeration in all lung fields. ABD: soft, NT/ND EXT: no clubbing, cyanosis, or edema.  Neuro: CN 2-12 intact bilaterally, strength 5/5 in proximal and distal upper extremities and lower extremities bilaterally.  He has a very fine resting tremor in fingers when holding his hands outstretched in front of him.  No disdiadochokinesis.  No ataxia.  Upper extremity and lower extremity DTRs symmetric.    LABS: none today  IMPRESSION AND PLAN:  1) Hot flashes and night sweats: no pattern, has been a complaint of his on/off for >1 yr. Lab eval in the past neg.  Will check TSH, testost, BMET, CBC, and ESR again and if all negative then I recommend no further w/u at this time.  Most likely dx is chronic anxiety.  Doubt med side effect.  The only meds he takes with regularity are citalopram and omeprazole.  He has no sign on exam of any serotonin excess.    2) Eustacian tube dysfunction: restart daily flonase.  Reassured pt that technically this condition has nothing to do with his urinary tract, even though the symptom is brought on lately by urge to urinate (? Pressure change in head/sinuses with the effort of holding his urine in).  3) Chronic low back pain: he is setting up another eval/check up for this problem with his neurosurgeon, Dr. Noel Gerold.  An After Visit Summary was printed and given to the patient.  FOLLOW UP: prn

## 2012-12-22 ENCOUNTER — Other Ambulatory Visit: Payer: Self-pay | Admitting: *Deleted

## 2012-12-22 ENCOUNTER — Telehealth (INDEPENDENT_AMBULATORY_CARE_PROVIDER_SITE_OTHER): Payer: Self-pay | Admitting: General Surgery

## 2012-12-22 MED ORDER — CITALOPRAM HYDROBROMIDE 20 MG PO TABS: ORAL_TABLET | ORAL | Status: DC

## 2012-12-22 NOTE — Telephone Encounter (Signed)
Patient's wife called in to make a follow up appt. She states patient has been using the cream prescribed and doing warm tub soaks and his hemorrhoid is no better. I offered re-evaluation in urgent office. Patient's wife will call back and let us know if they can come in for appt.

## 2012-12-22 NOTE — Telephone Encounter (Signed)
I sent the previous result note to this patient's chart by mistake (the one about RF'ing med in April, May, and June..etc). Pls disregard it. I RF'd this patient's citalopram [name]).--thx

## 2012-12-22 NOTE — Telephone Encounter (Signed)
Refill request for citalopram  Last filled by MD on - 09/22/12 #30 x2 Last seen- 12/19/12 F/U- 12/25/12 Please advise refill?

## 2012-12-22 NOTE — Telephone Encounter (Signed)
I printed this rx---last visit I gave rx's for April, May, and June ---which makes her due for RF appropriately now. However, she asked if it could be sent by interoffice mail and the answer to this question is no, it can't. We can mail it by [place] postal service or she can come by the office and pick it up.  Needs o/v in 1-2 months.-thx

## 2012-12-23 ENCOUNTER — Other Ambulatory Visit: Payer: Self-pay | Admitting: Family Medicine

## 2012-12-23 DIAGNOSIS — E291 Testicular hypofunction: Secondary | ICD-10-CM

## 2012-12-25 ENCOUNTER — Encounter (INDEPENDENT_AMBULATORY_CARE_PROVIDER_SITE_OTHER): Payer: Self-pay | Admitting: Surgery

## 2012-12-25 ENCOUNTER — Encounter: Payer: 59 | Admitting: Family Medicine

## 2012-12-25 ENCOUNTER — Ambulatory Visit (INDEPENDENT_AMBULATORY_CARE_PROVIDER_SITE_OTHER): Payer: 59 | Admitting: Surgery

## 2012-12-25 VITALS — BP 124/80 | HR 67 | Temp 97.7°F | Resp 14 | Ht 69.0 in | Wt 227.0 lb

## 2012-12-25 DIAGNOSIS — K645 Perianal venous thrombosis: Secondary | ICD-10-CM

## 2012-12-25 NOTE — Progress Notes (Signed)
Subjective:     Patient ID: [name], male   DOB: August 10, 1970, 42 y.o.   MRN: [id number]  HPI He is to follow up with from Dr. Arne Cleveland With a thrombosed hemorrhoid. The patient elected to try conservative measures but has continued to have discomfort  Review of Systems     Objective:   Physical Exam On examination, there remains a thrombosed hemorrhoid. After discussion with him, I prepped the area with Betadine, anesthetized with lidocaine, and drained the hemorrhoid.    Assessment:     Thrombosed external hemorrhoid     Plan:     Wound care instructions were given. He will see [place] back as needed of

## 2012-12-30 ENCOUNTER — Encounter: Payer: 59 | Admitting: Family Medicine

## 2013-01-08 ENCOUNTER — Encounter: Payer: 59 | Admitting: Family Medicine

## 2013-01-14 ENCOUNTER — Encounter: Payer: 59 | Admitting: Family Medicine

## 2013-01-15 DIAGNOSIS — M25519 Pain in unspecified shoulder: Secondary | ICD-10-CM | POA: Diagnosis not present

## 2013-01-20 ENCOUNTER — Encounter: Payer: 59 | Admitting: Family Medicine

## 2013-01-23 ENCOUNTER — Encounter: Payer: Self-pay | Admitting: Family Medicine

## 2013-01-23 ENCOUNTER — Ambulatory Visit (INDEPENDENT_AMBULATORY_CARE_PROVIDER_SITE_OTHER): Payer: 59 | Admitting: Family Medicine

## 2013-01-23 VITALS — BP 116/79 | HR 65 | Temp 97.4°F | Resp 18 | Ht 69.0 in | Wt 227.0 lb

## 2013-01-23 DIAGNOSIS — Z0000 Encounter for general adult medical examination without abnormal findings: Secondary | ICD-10-CM | POA: Diagnosis not present

## 2013-01-23 DIAGNOSIS — E291 Testicular hypofunction: Secondary | ICD-10-CM | POA: Diagnosis not present

## 2013-01-23 LAB — LIPID PANEL
LDL Cholesterol: 112 mg/dL — ABNORMAL HIGH (ref 0–99)
Total CHOL/HDL Ratio: 4
Triglycerides: 82 mg/dL (ref 0.0–149.0)
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
## 2013-01-23 NOTE — Assessment & Plan Note (Signed)
Reviewed age and gender appropriate health maintenance issues (prudent diet, regular exercise, health risks of tobacco and excessive alcohol, use of seatbelts, fire alarms in home, use of sunscreen).  Also reviewed age and gender appropriate health screening as well as vaccine recommendations Pt was willing to get flu vaccine today but he left before I remembered to give this to him.   He can return for this prn. He has had recent metabolic panel, CBC, and TSH testing and these were normal. FLP done today.

## 2013-01-23 NOTE — Assessment & Plan Note (Signed)
Initial testosterone check was a bit low. Will repeat today and check LH, prolactin, and PSA. If testost low again, will discuss starting testost replacement with pt.  We went over the different types of replacement therapy available today so he can think about them. We reviewed the potential risks and benefits of testost replacement therapy today.

## 2013-02-09 ENCOUNTER — Telehealth: Payer: Self-pay | Admitting: Family Medicine

## 2013-02-09 NOTE — Telephone Encounter (Signed)
A user error has taken place: encounter opened in error, closed for administrative reasons.

## 2013-02-12 ENCOUNTER — Ambulatory Visit (INDEPENDENT_AMBULATORY_CARE_PROVIDER_SITE_OTHER): Payer: 59 | Admitting: Family Medicine

## 2013-02-12 ENCOUNTER — Encounter: Payer: Self-pay | Admitting: Family Medicine

## 2013-02-12 VITALS — BP 123/80 | HR 66 | Temp 97.6°F | Resp 18 | Ht 69.0 in | Wt 226.0 lb

## 2013-02-12 DIAGNOSIS — H6980 Other specified disorders of Eustachian tube, unspecified ear: Secondary | ICD-10-CM

## 2013-02-12 DIAGNOSIS — H6990 Unspecified Eustachian tube disorder, unspecified ear: Secondary | ICD-10-CM | POA: Diagnosis not present

## 2013-02-12 DIAGNOSIS — Z23 Encounter for immunization: Secondary | ICD-10-CM | POA: Diagnosis not present

## 2013-02-12 DIAGNOSIS — H6981 Other specified disorders of Eustachian tube, right ear: Secondary | ICD-10-CM

## 2013-02-12 MED ORDER — FLUTICASONE PROPIONATE 50 MCG/ACT NA SUSP: NASAL | Status: DC

## 2013-02-12 NOTE — Addendum Note (Signed)
Addended by: Eulah Pont on: 02/12/2013 10:11 AM   Modules accepted: Orders

## 2013-02-12 NOTE — Assessment & Plan Note (Addendum)
Discussed dx.  I find no abnormalities on exam today.  Nothing to suggest TMJ on exam. Restart flonase daily.  Use OTC generic afrin for topical decongestant hs for max 3 nights. If not improved in a couple of weeks he can return and/or call and I'll check x-ray of the area (he had a bone cyst removed from right mandible in the past and is worried about this). Reassured pt today.
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
## 2013-02-12 NOTE — Patient Instructions (Signed)
Buy generic (OTC) afrin and use 2 sprays each nostril for max of 3 nights in a row--this is a decongestant spray to use 10 min before using flonase.

## 2013-02-27 ENCOUNTER — Ambulatory Visit (INDEPENDENT_AMBULATORY_CARE_PROVIDER_SITE_OTHER): Payer: 59 | Admitting: Family Medicine

## 2013-02-27 ENCOUNTER — Encounter: Payer: Self-pay | Admitting: Family Medicine

## 2013-02-27 VITALS — BP 135/88 | HR 61 | Temp 98.2°F | Resp 18 | Ht 69.0 in | Wt 225.0 lb

## 2013-02-27 DIAGNOSIS — Z79899 Other long term (current) drug therapy: Secondary | ICD-10-CM | POA: Diagnosis not present

## 2013-02-27 DIAGNOSIS — F410 Panic disorder [episodic paroxysmal anxiety] without agoraphobia: Secondary | ICD-10-CM | POA: Diagnosis not present

## 2013-02-27 DIAGNOSIS — F411 Generalized anxiety disorder: Secondary | ICD-10-CM

## 2013-02-27 DIAGNOSIS — M25559 Pain in unspecified hip: Secondary | ICD-10-CM

## 2013-02-27 DIAGNOSIS — M25552 Pain in left hip: Secondary | ICD-10-CM

## 2013-02-27 MED ORDER — OXYCODONE-ACETAMINOPHEN 5-325 MG PO TABS: ORAL_TABLET | ORAL | Status: DC
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
## 2013-02-28 LAB — PRESCRIPTION ABUSE MONITORING 10P, URINE
Barbiturate Screen, Urine: NEGATIVE ng/mL
Buprenorphine, Urine: NEGATIVE ng/mL
Methadone Screen, Urine: NEGATIVE ng/mL
Opiate Screen, Urine: NEGATIVE ng/mL
Oxycodone Screen, Ur: NEGATIVE ng/mL
Propoxyphene: NEGATIVE ng/mL

## 2013-03-12 DIAGNOSIS — M67919 Unspecified disorder of synovium and tendon, unspecified shoulder: Secondary | ICD-10-CM | POA: Diagnosis not present

## 2013-03-12 DIAGNOSIS — M25519 Pain in unspecified shoulder: Secondary | ICD-10-CM | POA: Diagnosis not present

## 2013-03-16 ENCOUNTER — Other Ambulatory Visit: Payer: Self-pay | Admitting: Orthopaedic Surgery

## 2013-03-16 DIAGNOSIS — M25511 Pain in right shoulder: Secondary | ICD-10-CM

## 2013-03-20 ENCOUNTER — Ambulatory Visit
Admission: RE | Admit: 2013-03-20 | Discharge: 2013-03-20 | Disposition: A | Discharge status: Home / Self Care | Payer: 59 | Source: Ambulatory Visit | Attending: Orthopaedic Surgery | Admitting: Orthopaedic Surgery

## 2013-03-20 DIAGNOSIS — M25511 Pain in right shoulder: Secondary | ICD-10-CM

## 2013-03-22 ENCOUNTER — Other Ambulatory Visit: Payer: 59

## 2013-03-30 ENCOUNTER — Other Ambulatory Visit: Payer: Self-pay | Admitting: Family Medicine

## 2013-03-30 MED ORDER — CITALOPRAM HYDROBROMIDE 20 MG PO TABS: ORAL_TABLET | ORAL | Status: DC

## 2013-04-15 ENCOUNTER — Ambulatory Visit: Payer: 59 | Admitting: Family Medicine

## 2013-04-16 ENCOUNTER — Ambulatory Visit: Payer: 59 | Admitting: Family Medicine

## 2013-04-16 ENCOUNTER — Other Ambulatory Visit: Payer: Self-pay | Admitting: Family Medicine

## 2013-04-16 MED ORDER — OMEPRAZOLE 20 MG PO CPDR: 20.0000 mg | DELAYED_RELEASE_CAPSULE | Freq: Two times a day (BID) | ORAL | Status: DC

## 2013-04-20 ENCOUNTER — Ambulatory Visit (INDEPENDENT_AMBULATORY_CARE_PROVIDER_SITE_OTHER): Payer: 59 | Admitting: Family Medicine

## 2013-04-20 ENCOUNTER — Encounter: Payer: Self-pay | Admitting: Family Medicine

## 2013-04-20 VITALS — BP 115/77 | HR 77 | Temp 98.6°F | Resp 18 | Ht 69.0 in | Wt 225.0 lb

## 2013-04-20 DIAGNOSIS — IMO0002 Reserved for concepts with insufficient information to code with codable children: Secondary | ICD-10-CM

## 2013-04-20 DIAGNOSIS — M51369 Other intervertebral disc degeneration, lumbar region without mention of lumbar back pain or lower extremity pain: Secondary | ICD-10-CM

## 2013-04-20 DIAGNOSIS — M51379 Other intervertebral disc degeneration, lumbosacral region without mention of lumbar back pain or lower extremity pain: Secondary | ICD-10-CM

## 2013-04-20 DIAGNOSIS — M5137 Other intervertebral disc degeneration, lumbosacral region: Secondary | ICD-10-CM | POA: Diagnosis not present

## 2013-04-20 DIAGNOSIS — M5416 Radiculopathy, lumbar region: Secondary | ICD-10-CM

## 2013-04-20 DIAGNOSIS — M5136 Other intervertebral disc degeneration, lumbar region: Secondary | ICD-10-CM

## 2013-04-20 MED ORDER — GABAPENTIN 300 MG PO CAPS: 300.0000 mg | ORAL_CAPSULE | Freq: Three times a day (TID) | ORAL | Status: DC

## 2013-04-20 MED ORDER — PREDNISONE 20 MG PO TABS: ORAL_TABLET | ORAL | Status: DC

## 2013-04-20 NOTE — Assessment & Plan Note (Signed)
He is a tough patient to get a clear story from b/c his history is interlaced with lots of hypochondriacal and catastrophic thinking. He has a history suggestive of lumbar radiculopathy but no objective signs today to support this. No new imaging ordered today.  I did start him on neurontin today, 300mg  qhs and titrate up to 300 tid over the next 10 days. I will make a referral to a new neurosurgeon at his request---he requests Dr. Tressie Stalker, whom a friend had recommended to him.
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
## 2013-05-12 ENCOUNTER — Other Ambulatory Visit (HOSPITAL_COMMUNITY): Payer: Self-pay | Admitting: Orthopaedic Surgery

## 2013-05-18 ENCOUNTER — Encounter (HOSPITAL_COMMUNITY): Payer: Self-pay | Admitting: Pharmacy Technician

## 2013-05-19 ENCOUNTER — Other Ambulatory Visit: Payer: Self-pay | Admitting: Family Medicine

## 2013-05-19 ENCOUNTER — Telehealth: Payer: Self-pay | Admitting: Family Medicine

## 2013-05-19 MED ORDER — IBUPROFEN 800 MG PO TABS: 800.0000 mg | ORAL_TABLET | Freq: Three times a day (TID) | ORAL | Status: DC | PRN

## 2013-05-19 NOTE — Telephone Encounter (Signed)
Patient requesting refill of ibuprofen but I don't see it in his medication list.  Please advise.

## 2013-05-19 NOTE — Patient Instructions (Addendum)
Diamond Martucci Zahn  05/19/2013                           YOUR PROCEDURE IS SCHEDULED ON: 05/29/13               PLEASE REPORT TO SHORT STAY CENTER AT : 5:30 AM               CALL THIS NUMBER IF ANY PROBLEMS THE DAY OF SURGERY :               [phone]                      REMEMBER:   Do not eat food or drink liquids AFTER MIDNIGHT Thursday NIGHT   Take these medicines the morning of surgery with A SIP OF WATER: CITALOPRAM,  OMEPRAZOLE                                  MAY TAKE PERCOCET IF NEEDED   Do not wear jewelry, make-up   Do not wear lotions, powders, or perfumes.   Do not shave legs or underarms 12 hrs. before surgery (men may shave face)  Do not bring valuables to the hospital.  Contacts, dentures or bridgework may not be worn into surgery.  Leave suitcase in the car. After surgery it may be brought to your room.  For patients admitted to the hospital more than one night, checkout time is 11:00                          The day of discharge.   Patients discharged the day of surgery will not be allowed to drive home                             If going home same day of surgery, must have someone stay with you first                           24 hrs at home and arrange for some one to drive you home from hospital.  WIFE WILL BE DRIVER  Special Instructions:   Please read over the following fact sheets that you were given:                                    1. [facility] PREPARING FOR SURGERY SHEET                                                X_____________________________________________________________________        Failure to follow these instructions may result in cancellation of your surgery

## 2013-05-19 NOTE — Telephone Encounter (Signed)
Rx for ibuprofen 800mg  eRx'd.

## 2013-05-25 ENCOUNTER — Encounter (HOSPITAL_COMMUNITY)
Admission: RE | Admit: 2013-05-25 | Discharge: 2013-05-25 | Disposition: A | Discharge status: Home / Self Care | Payer: 59 | Source: Ambulatory Visit | Attending: Orthopaedic Surgery | Admitting: Orthopaedic Surgery

## 2013-05-25 ENCOUNTER — Encounter (HOSPITAL_COMMUNITY): Payer: Self-pay

## 2013-05-25 DIAGNOSIS — Z01812 Encounter for preprocedural laboratory examination: Secondary | ICD-10-CM | POA: Insufficient documentation

## 2013-05-25 DIAGNOSIS — Z01818 Encounter for other preprocedural examination: Secondary | ICD-10-CM | POA: Insufficient documentation

## 2013-05-25 LAB — CBC
HCT: 42.5 % (ref 39.0–52.0)
Hemoglobin: 14.5 g/dL (ref 13.0–17.0)
MCH: 30.3 pg (ref 26.0–34.0)
MCV: 88.7 fL (ref 78.0–100.0)
Platelets: 303 10*3/uL (ref 150–400)
RBC: 4.79 MIL/uL (ref 4.22–5.81)
WBC: 6.6 10*3/uL (ref 4.0–10.5)

## 2013-05-29 ENCOUNTER — Encounter (HOSPITAL_COMMUNITY): Payer: Self-pay | Admitting: *Deleted

## 2013-05-29 ENCOUNTER — Ambulatory Visit (HOSPITAL_COMMUNITY): Payer: 59 | Admitting: Registered Nurse

## 2013-05-29 ENCOUNTER — Encounter (HOSPITAL_COMMUNITY): Payer: 59 | Admitting: Registered Nurse

## 2013-05-29 ENCOUNTER — Encounter (HOSPITAL_COMMUNITY)
Admission: RE | Disposition: A | Discharge status: Home / Self Care | Payer: Self-pay | Source: Ambulatory Visit | Attending: Orthopaedic Surgery

## 2013-05-29 ENCOUNTER — Ambulatory Visit (HOSPITAL_COMMUNITY)
Admission: RE | Admit: 2013-05-29 | Discharge: 2013-05-29 | Disposition: A | Discharge status: Home / Self Care | Payer: 59 | Source: Ambulatory Visit | Attending: Orthopaedic Surgery | Admitting: Orthopaedic Surgery

## 2013-05-29 DIAGNOSIS — I739 Peripheral vascular disease, unspecified: Secondary | ICD-10-CM | POA: Insufficient documentation

## 2013-05-29 DIAGNOSIS — K219 Gastro-esophageal reflux disease without esophagitis: Secondary | ICD-10-CM | POA: Diagnosis not present

## 2013-05-29 DIAGNOSIS — M25819 Other specified joint disorders, unspecified shoulder: Secondary | ICD-10-CM | POA: Insufficient documentation

## 2013-05-29 DIAGNOSIS — M67919 Unspecified disorder of synovium and tendon, unspecified shoulder: Secondary | ICD-10-CM | POA: Diagnosis not present

## 2013-05-29 DIAGNOSIS — G4733 Obstructive sleep apnea (adult) (pediatric): Secondary | ICD-10-CM | POA: Insufficient documentation

## 2013-05-29 DIAGNOSIS — K589 Irritable bowel syndrome without diarrhea: Secondary | ICD-10-CM | POA: Diagnosis not present

## 2013-05-29 DIAGNOSIS — Z86711 Personal history of pulmonary embolism: Secondary | ICD-10-CM | POA: Insufficient documentation

## 2013-05-29 DIAGNOSIS — Z79899 Other long term (current) drug therapy: Secondary | ICD-10-CM | POA: Insufficient documentation

## 2013-05-29 DIAGNOSIS — Z87891 Personal history of nicotine dependence: Secondary | ICD-10-CM | POA: Insufficient documentation

## 2013-05-29 DIAGNOSIS — M7541 Impingement syndrome of right shoulder: Secondary | ICD-10-CM

## 2013-05-29 DIAGNOSIS — M19019 Primary osteoarthritis, unspecified shoulder: Secondary | ICD-10-CM | POA: Insufficient documentation

## 2013-05-29 DIAGNOSIS — M7580 Other shoulder lesions, unspecified shoulder: Principal | ICD-10-CM

## 2013-05-29 DIAGNOSIS — K449 Diaphragmatic hernia without obstruction or gangrene: Secondary | ICD-10-CM | POA: Insufficient documentation

## 2013-05-29 DIAGNOSIS — Z7982 Long term (current) use of aspirin: Secondary | ICD-10-CM | POA: Insufficient documentation

## 2013-05-29 SURGERY — SHOULDER ARTHROSCOPY WITH SUBACROMIAL DECOMPRESSION AND DISTAL CLAVICLE EXCISION
Anesthesia: General | Site: Shoulder | Laterality: Right

## 2013-05-29 MED ORDER — NEOSTIGMINE METHYLSULFATE 1 MG/ML IJ SOLN
INTRAMUSCULAR | Status: AC
Start: 2013-05-29 — End: 2013-05-29
  Filled 2013-05-29: qty 10

## 2013-05-29 MED ORDER — SUFENTANIL CITRATE 50 MCG/ML IV SOLN
INTRAVENOUS | Status: DC | PRN
  Administered 2013-05-29 (×3): 10 ug via INTRAVENOUS

## 2013-05-29 MED ORDER — MORPHINE SULFATE 4 MG/ML IJ SOLN
INTRAMUSCULAR | Status: AC
  Filled 2013-05-29: qty 1

## 2013-05-29 MED ORDER — HYDROMORPHONE HCL PF 1 MG/ML IJ SOLN
INTRAMUSCULAR | Status: AC
  Filled 2013-05-29: qty 1

## 2013-05-29 MED ORDER — DEXAMETHASONE SODIUM PHOSPHATE 10 MG/ML IJ SOLN
INTRAMUSCULAR | Status: AC
  Filled 2013-05-29: qty 1

## 2013-05-29 MED ORDER — ROCURONIUM BROMIDE 100 MG/10ML IV SOLN
INTRAVENOUS | Status: DC | PRN
  Administered 2013-05-29: 5 mg via INTRAVENOUS
  Administered 2013-05-29: 45 mg via INTRAVENOUS

## 2013-05-29 MED ORDER — SUCCINYLCHOLINE CHLORIDE 20 MG/ML IJ SOLN
INTRAMUSCULAR | Status: DC | PRN
  Administered 2013-05-29: 100 mg via INTRAVENOUS

## 2013-05-29 MED ORDER — SODIUM CHLORIDE 0.9 % IJ SOLN
INTRAMUSCULAR | Status: AC
  Filled 2013-05-29: qty 20

## 2013-05-29 MED ORDER — ONDANSETRON HCL 4 MG/2ML IJ SOLN
INTRAMUSCULAR | Status: AC
  Filled 2013-05-29: qty 2

## 2013-05-29 MED ORDER — MIDAZOLAM HCL 2 MG/2ML IJ SOLN
INTRAMUSCULAR | Status: AC
  Filled 2013-05-29: qty 2

## 2013-05-29 MED ORDER — PHENYLEPHRINE HCL 10 MG/ML IJ SOLN
INTRAMUSCULAR | Status: DC | PRN
  Administered 2013-05-29 (×2): 120 ug via INTRAVENOUS

## 2013-05-29 MED ORDER — OXYCODONE HCL 5 MG/5ML PO SOLN
5.0000 mg | Freq: Once | ORAL | Status: DC | PRN
  Filled 2013-05-29: qty 5

## 2013-05-29 MED ORDER — MIDAZOLAM HCL 5 MG/5ML IJ SOLN
INTRAMUSCULAR | Status: DC | PRN
  Administered 2013-05-29: 2 mg via INTRAVENOUS

## 2013-05-29 MED ORDER — PROPOFOL 10 MG/ML IV BOLUS
INTRAVENOUS | Status: AC
  Filled 2013-05-29: qty 20

## 2013-05-29 MED ORDER — BUPIVACAINE-EPINEPHRINE PF 0.25-1:200000 % IJ SOLN
INTRAMUSCULAR | Status: AC
  Filled 2013-05-29: qty 30

## 2013-05-29 MED ORDER — PROMETHAZINE HCL 25 MG/ML IJ SOLN
INTRAMUSCULAR | Status: AC
  Filled 2013-05-29: qty 1

## 2013-05-29 MED ORDER — LACTATED RINGERS IV SOLN
INTRAVENOUS | Status: DC | PRN
  Administered 2013-05-29 (×2): via INTRAVENOUS

## 2013-05-29 MED ORDER — GLYCOPYRROLATE 0.2 MG/ML IJ SOLN
INTRAMUSCULAR | Status: DC | PRN
  Administered 2013-05-29: 0.4 mg via INTRAVENOUS

## 2013-05-29 MED ORDER — MORPHINE SULFATE 4 MG/ML IJ SOLN
INTRAMUSCULAR | Status: DC | PRN
  Administered 2013-05-29: 4 mg

## 2013-05-29 MED ORDER — HYDROMORPHONE HCL PF 2 MG/ML IJ SOLN
INTRAMUSCULAR | Status: AC
  Filled 2013-05-29: qty 1

## 2013-05-29 MED ORDER — MEPERIDINE HCL 50 MG/ML IJ SOLN
6.2500 mg | INTRAMUSCULAR | Status: DC | PRN
  Administered 2013-05-29: 12.5 mg via INTRAVENOUS

## 2013-05-29 MED ORDER — DEXAMETHASONE SODIUM PHOSPHATE 10 MG/ML IJ SOLN
INTRAMUSCULAR | Status: DC | PRN
  Administered 2013-05-29: 10 mg via INTRAVENOUS

## 2013-05-29 MED ORDER — BUPIVACAINE-EPINEPHRINE (PF) 0.25% -1:200000 IJ SOLN
INTRAMUSCULAR | Status: DC | PRN
  Administered 2013-05-29: 20 mL

## 2013-05-29 MED ORDER — EPINEPHRINE HCL 1 MG/ML IJ SOLN
INTRAMUSCULAR | Status: DC | PRN
  Administered 2013-05-29: 2 mL

## 2013-05-29 MED ORDER — LIDOCAINE HCL (CARDIAC) 20 MG/ML IV SOLN
INTRAVENOUS | Status: AC
  Filled 2013-05-29: qty 5

## 2013-05-29 MED ORDER — GLYCOPYRROLATE 0.2 MG/ML IJ SOLN
INTRAMUSCULAR | Status: AC
Start: 2013-05-29 — End: 2013-05-29
  Filled 2013-05-29: qty 3

## 2013-05-29 MED ORDER — ROCURONIUM BROMIDE 100 MG/10ML IV SOLN
INTRAVENOUS | Status: AC
  Filled 2013-05-29: qty 1

## 2013-05-29 MED ORDER — PHENYLEPHRINE 40 MCG/ML (10ML) SYRINGE FOR IV PUSH (FOR BLOOD PRESSURE SUPPORT)
PREFILLED_SYRINGE | INTRAVENOUS | Status: AC
  Filled 2013-05-29: qty 10

## 2013-05-29 MED ORDER — SUFENTANIL CITRATE 50 MCG/ML IV SOLN
INTRAVENOUS | Status: AC
  Filled 2013-05-29: qty 1

## 2013-05-29 MED ORDER — NEOSTIGMINE METHYLSULFATE 1 MG/ML IJ SOLN
INTRAMUSCULAR | Status: DC | PRN
  Administered 2013-05-29: 3 mg via INTRAVENOUS

## 2013-05-29 MED ORDER — METHOCARBAMOL 500 MG PO TABS: 500.0000 mg | ORAL_TABLET | Freq: Four times a day (QID) | ORAL | Status: DC | PRN

## 2013-05-29 MED ORDER — HYDROMORPHONE HCL PF 1 MG/ML IJ SOLN
0.2500 mg | INTRAMUSCULAR | Status: DC | PRN
  Administered 2013-05-29: 0.5 mg via INTRAVENOUS

## 2013-05-29 MED ORDER — CEFAZOLIN SODIUM-DEXTROSE 2-3 GM-% IV SOLR
2.0000 g | INTRAVENOUS | Status: AC
  Administered 2013-05-29: 2 g via INTRAVENOUS

## 2013-05-29 MED ORDER — CEFAZOLIN SODIUM-DEXTROSE 2-3 GM-% IV SOLR
INTRAVENOUS | Status: AC
  Filled 2013-05-29: qty 50

## 2013-05-29 MED ORDER — ONDANSETRON HCL 4 MG/2ML IJ SOLN
INTRAMUSCULAR | Status: DC | PRN
  Administered 2013-05-29: 4 mg via INTRAVENOUS

## 2013-05-29 MED ORDER — HYDROMORPHONE HCL PF 1 MG/ML IJ SOLN
INTRAMUSCULAR | Status: DC | PRN
  Administered 2013-05-29 (×2): 1 mg via INTRAVENOUS

## 2013-05-29 MED ORDER — EPINEPHRINE HCL 1 MG/ML IJ SOLN
INTRAMUSCULAR | Status: AC
Start: 2013-05-29 — End: 2013-05-29
  Filled 2013-05-29: qty 2

## 2013-05-29 MED ORDER — OXYCODONE HCL 5 MG PO TABS: 5.0000 mg | ORAL_TABLET | Freq: Once | ORAL | Status: DC | PRN

## 2013-05-29 MED ORDER — SODIUM CHLORIDE 0.9 % IR SOLN
Status: DC | PRN
  Administered 2013-05-29: 6000 mL

## 2013-05-29 MED ORDER — PROMETHAZINE HCL 25 MG/ML IJ SOLN
6.2500 mg | INTRAMUSCULAR | Status: DC | PRN
  Administered 2013-05-29: 6.25 mg via INTRAVENOUS

## 2013-05-29 MED ORDER — OXYCODONE-ACETAMINOPHEN 5-325 MG PO TABS: 1.0000 | ORAL_TABLET | ORAL | Status: DC | PRN

## 2013-05-29 MED ORDER — SUCCINYLCHOLINE CHLORIDE 20 MG/ML IJ SOLN
INTRAMUSCULAR | Status: AC
  Filled 2013-05-29: qty 1

## 2013-05-29 MED ORDER — LIDOCAINE HCL (CARDIAC) 20 MG/ML IV SOLN
INTRAVENOUS | Status: DC | PRN
  Administered 2013-05-29: 75 mg via INTRAVENOUS
  Administered 2013-05-29: 25 mg via INTRATRACHEAL

## 2013-05-29 MED ORDER — MEPERIDINE HCL 50 MG/ML IJ SOLN
INTRAMUSCULAR | Status: AC
  Filled 2013-05-29: qty 1

## 2013-05-29 MED ORDER — PROPOFOL 10 MG/ML IV BOLUS
INTRAVENOUS | Status: DC | PRN
  Administered 2013-05-29: 200 mg via INTRAVENOUS

## 2013-05-29 SURGICAL SUPPLY — 29 items
BLADE 4.2CUDA (BLADE) ×3 IMPLANT
BLADE SURG SZ11 CARB STEEL (BLADE) ×3 IMPLANT
BUR VERTEX HOODED 4.5 (BURR) ×3 IMPLANT
DRAPE SHOULDER BEACH CHAIR (DRAPES) ×3 IMPLANT
DRAPE U-SHAPE 47X51 STRL (DRAPES) ×6 IMPLANT
DURAPREP 26ML APPLICATOR (WOUND CARE) ×3 IMPLANT
GAUZE XEROFORM 1X8 LF (GAUZE/BANDAGES/DRESSINGS) ×3 IMPLANT
GLOVE BIO SURGEON STRL SZ7.5 (GLOVE) ×3 IMPLANT
GLOVE BIOGEL PI IND STRL 8 (GLOVE) ×2 IMPLANT
GLOVE BIOGEL PI INDICATOR 8 (GLOVE) ×4
GLOVE ECLIPSE 8.0 STRL XLNG CF (GLOVE) ×3 IMPLANT
GLOVE SURG SS PI 7.5 STRL IVOR (GLOVE) ×6 IMPLANT
GOWN STRL REIN XL XLG (GOWN DISPOSABLE) ×9 IMPLANT
KIT SHOULDER TRACTION (DRAPES) ×3 IMPLANT
MANIFOLD NEPTUNE II (INSTRUMENTS) ×3 IMPLANT
NEEDLE SPNL 18GX3.5 QUINCKE PK (NEEDLE) ×3 IMPLANT
PACK SHOULDER CUSTOM OPM052 (CUSTOM PROCEDURE TRAY) ×3 IMPLANT
PAD ABD 8X10 STRL (GAUZE/BANDAGES/DRESSINGS) ×3 IMPLANT
POSITIONER SURGICAL ARM (MISCELLANEOUS) ×3 IMPLANT
SET ARTHROSCOPY TUBING (MISCELLANEOUS) ×3
SET ARTHROSCOPY TUBING LN (MISCELLANEOUS) ×1 IMPLANT
SLING ARM FOAM STRAP LRG (SOFTGOODS) ×3 IMPLANT
SPONGE GAUZE 4X4 12PLY (GAUZE/BANDAGES/DRESSINGS) ×3 IMPLANT
SUT ETHILON 3 0 PS 1 (SUTURE) ×3 IMPLANT
TAPE CLOTH SURG 6X10 WHT LF (GAUZE/BANDAGES/DRESSINGS) ×3 IMPLANT
TOWEL OR 17X26 10 PK STRL BLUE (TOWEL DISPOSABLE) ×3 IMPLANT
TUBING CONNECTING 10 (TUBING) ×2 IMPLANT
TUBING CONNECTING 10' (TUBING) ×1
WAND 90 DEG TURBOVAC W/CORD (SURGICAL WAND) ×3 IMPLANT

## 2013-05-29 NOTE — Transfer of Care (Signed)
Immediate Anesthesia Transfer of Care Note  Patient: [name]  Procedure(s) Performed: Procedure(s): RIGHT SHOULDER ARTHROSCOPY WITH SUBACROMIAL DECOMPRESSION AND DISTAL CLAVICLE RESECTION (Right)  Patient Location: PACU  Anesthesia Type:General  Level of Consciousness: awake, alert , oriented and patient cooperative  Airway & Oxygen Therapy: Patient Spontanous Breathing and Patient connected to face mask oxygen  Post-op Assessment: Report given to PACU RN, Post -op Vital signs reviewed and stable and Patient moving all extremities X 4  Post vital signs: stable  Complications: No apparent anesthesia complications

## 2013-05-29 NOTE — Discharge Instructions (Signed)
Increase your activities as comfort allows. Come out of sling occasionally to move your elbow and shoulder. Ice for swelling.  No overhead lifting. You can remove your dressings tomorrow 05/30/13 and get your incisions wet in the shower. Band-aids daily over the incisions to protect them.

## 2013-05-29 NOTE — Anesthesia Postprocedure Evaluation (Signed)
Anesthesia Post Note  Patient: [name]  Procedure(s) Performed: Procedure(s) (LRB): RIGHT SHOULDER ARTHROSCOPY WITH SUBACROMIAL DECOMPRESSION AND DISTAL CLAVICLE RESECTION (Right)  Anesthesia type: General  Patient location: PACU  Post pain: Pain level controlled  Post assessment: Post-op Vital signs reviewed  Last Vitals: BP 122/63  Pulse 62  Temp(Src) 36.8 C (Oral)  Resp 12  SpO2 100%  Post vital signs: Reviewed  Level of consciousness: sedated  Complications: No apparent anesthesia complications
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
## 2013-05-29 NOTE — Op Note (Signed)
NAMEINRI, SOBIESKI NO.:  [id number]  MEDICAL RECORD NO.:  [id number]  LOCATION:  [facility]                         FACILITY:  [facility]  PHYSICIAN:  Lind Guest. [name], M.D.DATE OF BIRTH:  April 11, 1971  DATE OF PROCEDURE:  05/29/2013 DATE OF DISCHARGE:                              OPERATIVE REPORT   PREOPERATIVE DIAGNOSIS:  Severe impingement syndrome with acromioclavicular joint arthropathy, right shoulder.  POSTOPERATIVE DIAGNOSIS:  Severe impingement syndrome with acromioclavicular joint arthropathy, right shoulder.  PROCEDURE:  Right shoulder arthroscopy with debridement, subacromial decompression, and distal clavicle resection.  SURGEON:  Jean Rosenthal, MD.  ASSISTING:  Erskine Emery, PA-C.  ANESTHESIA: 1. General. 2. Local with 0.25% plain Sensorcaine mixed with 4 mg of morphine.  BLOOD LOSS:  Minimal.  COMPLICATIONS:  None.  INDICATIONS:  [name] is a 43 year old individual with severe impingement syndrome involving his right shoulder.  We tried multiple steroid injection in the shoulder, anti-inflammatories with rest, ice, heat, time, and exercises.  An MRI was eventually obtained that showed severe tendinosis of the rotator cuff,  downsloping acromion, AC joint arthropathy, and severe impingement.  With the failure of conservative treatment, he wished to proceed with an arthroscopic intervention to his shoulder.  The risks and benefits of the surgery were explained to him in detail and he did agree to proceed with the surgery.  PROCEDURE DESCRIPTION:  After informed consent was obtained, appropriate right shoulder was marked.  He was brought to the operating room, placed supine on the operating table, general anesthesia was then obtained.  He was then fashioned in a beach-chair position with appropriate positioning of the head and neck and padding of the down on operative left arm.  His right operative shoulder was then  prepped and draped with DuraPrep and sterile drapes was placed in skeletal traction using 10 pounds of traction, and the fishing pole traction device with 45 degrees for flexion and neutral rotation.  A time-out was called to identify the correct patient, correct right shoulder.  I then made a posterolateral arthroscopy portal and entered the glenohumeral joint, and could see there was degenerative fraying of the labrum and some minimal degenerative undersurface tearing of the rotator cuff.  Placing a arthroscopic soft tissue ablation wand in the rotator interval anteriorly, we performed a limited debridement of glenohumeral joint.  I then entered the subacromial space through the posterior portal and found significant tendinosis of the rotator cuff with quite a bit of thickened tissue.  There was a downsloping of the acromion, bursitis of the shoulder and AC arthropathy.  Through a separate lateral portal and the anterior portal, I performed a subacromial decompression having removed the thickened tissue of the rotator cuff and the bursa.  I used a high-speed bur, performed a distal clavicle resection and created a space between the distal clavicle and the acromion at the [facility] joint.  I then allowed fluid lavage of the shoulder.  We removed all instrumentation and closed the portal sites with interrupted nylon suture and placed a mixture of morphine and Marcaine into the subacromial space and in  the portal sites.  Well-padded sterile dressing was applied.  His right arm was placed in  a sling.  He was awakened, extubated and taken to recovery room in stable condition.  All final counts were correct.  There were no complications noted.  Of note, Erskine Emery, PA-C was present during the entire case.  His presence was crucial for helping [place] to get into and maintain positioning in the shoulder joints.     Lind Guest. [name], M.D.     CYB/MEDQ  D:  05/29/2013  T:  05/29/2013   Job:  [id number]

## 2013-05-29 NOTE — Anesthesia Preprocedure Evaluation (Addendum)
Anesthesia Evaluation  Patient identified by MRN, date of birth, ID band Patient awake    Reviewed: Allergy & Precautions, H&P , NPO status , Patient's Chart, lab work & pertinent test results  Airway Mallampati: II TM Distance: >3 FB Neck ROM: Full    Dental  (+) Dental Advisory Given   Pulmonary sleep apnea , former smoker,  breath sounds clear to auscultation        Cardiovascular - Peripheral Vascular Disease negative cardio ROS  Rhythm:Regular Rate:Normal  Echo 01/2012 Left ventricle: The cavity size was normal. Systolic function was normal. The estimated ejection fraction was in the range of 50% to 55%. Wall motion was normal; there were no regional wall motion abnormalities.    Neuro/Psych  Headaches, PSYCHIATRIC DISORDERS Anxiety    GI/Hepatic Neg liver ROS, hiatal hernia, PUD, GERD-  Medicated,  Endo/Other  negative endocrine ROS  Renal/GU Renal disease     Musculoskeletal negative musculoskeletal ROS (+)   Abdominal (+) + obese,   Peds  Hematology negative hematology ROS (+)   Anesthesia Other Findings   Reproductive/Obstetrics negative OB ROS                          Anesthesia Physical Anesthesia Plan  ASA: III  Anesthesia Plan: General   Post-op Pain Management:    Induction: Intravenous  Airway Management Planned: Oral ETT  Additional Equipment:   Intra-op Plan:   Post-operative Plan: Extubation in OR  Informed Consent: I have reviewed the patients History and Physical, chart, labs and discussed the procedure including the risks, benefits and alternatives for the proposed anesthesia with the patient or authorized representative who has indicated his/her understanding and acceptance.   Dental advisory given  Plan Discussed with: CRNA  Anesthesia Plan Comments:         Anesthesia Quick Evaluation

## 2013-05-29 NOTE — Brief Op Note (Signed)
05/29/2013  8:41 AM  PATIENT:  Rozell Searing  43 y.o. male  PRE-OPERATIVE DIAGNOSIS:  Severe impingement right shoulder, AC joint arthritis  POST-OPERATIVE DIAGNOSIS:  Severe impingement right shoulder, AC joint arthritis  PROCEDURE:  Procedure(s): RIGHT SHOULDER ARTHROSCOPY WITH SUBACROMIAL DECOMPRESSION AND DISTAL CLAVICLE RESECTION (Right)  SURGEON:  Surgeon(s) and Role:    * Mcarthur Rossetti, MD - Primary  PHYSICIAN ASSISTANT: Benita Stabile, PA-C  ANESTHESIA:   local and general  EBL:  Total I/O In: 1000 [I.V.:1000] Out: -   BLOOD ADMINISTERED:none  DRAINS: none   LOCAL MEDICATIONS USED:  Marcaine  SPECIMEN:  No Specimen  DISPOSITION OF SPECIMEN:  N/A  COUNTS:  YES  TOURNIQUET:  * No tourniquets in log *  DICTATION: .Other Dictation: Dictation Number [id number]  PLAN OF CARE: Discharge to home after PACU  PATIENT DISPOSITION:  PACU - hemodynamically stable.   Delay start of Pharmacological VTE agent (>24hrs) due to surgical blood loss or risk of bleeding: not applicable

## 2013-06-29 ENCOUNTER — Other Ambulatory Visit: Payer: Self-pay | Admitting: *Deleted

## 2013-06-29 MED ORDER — CITALOPRAM HYDROBROMIDE 20 MG PO TABS: 20.0000 mg | ORAL_TABLET | Freq: Every day | ORAL | Status: DC

## 2013-06-29 NOTE — Telephone Encounter (Signed)
Citalopram 20mg  qd rx sent to pharmacy.

## 2013-06-29 NOTE — Telephone Encounter (Signed)
Refill request for citalopram Last filled by MD on - PCP has never filled Last Appt: 04/20/13 Next Appt: none Please advise refill?

## 2013-07-13 ENCOUNTER — Telehealth: Payer: Self-pay | Admitting: Family Medicine

## 2013-07-13 MED ORDER — CYCLOBENZAPRINE HCL 10 MG PO TABS: 10.0000 mg | ORAL_TABLET | Freq: Every evening | ORAL | Status: DC | PRN

## 2013-07-13 NOTE — Telephone Encounter (Signed)
Patient requesting flexeril rx.  Please advise.

## 2013-07-13 NOTE — Telephone Encounter (Signed)
Rx for flexeril sent to pharmacy.

## 2013-07-13 NOTE — Telephone Encounter (Signed)
Patient is no longer seeing the physician that was writing the Rx. Patient is out.

## 2013-07-14 ENCOUNTER — Ambulatory Visit: Payer: 59 | Admitting: Family Medicine

## 2013-08-17 ENCOUNTER — Ambulatory Visit: Payer: 59 | Admitting: Family Medicine

## 2013-08-18 ENCOUNTER — Ambulatory Visit: Payer: 59 | Admitting: Family Medicine

## 2013-08-20 ENCOUNTER — Other Ambulatory Visit: Payer: Self-pay

## 2013-08-20 MED ORDER — OMEPRAZOLE 20 MG PO CPDR: 20.0000 mg | DELAYED_RELEASE_CAPSULE | Freq: Two times a day (BID) | ORAL | Status: DC

## 2013-10-21 ENCOUNTER — Other Ambulatory Visit: Payer: Self-pay | Admitting: Family Medicine

## 2013-10-24 ENCOUNTER — Encounter (HOSPITAL_COMMUNITY): Payer: Self-pay | Admitting: Emergency Medicine

## 2013-10-24 ENCOUNTER — Emergency Department (HOSPITAL_COMMUNITY)
Admission: EM | Admit: 2013-10-24 | Discharge: 2013-10-24 | Disposition: A | Discharge status: Home / Self Care | Payer: 59 | Attending: Emergency Medicine | Admitting: Emergency Medicine

## 2013-10-24 DIAGNOSIS — F411 Generalized anxiety disorder: Secondary | ICD-10-CM | POA: Insufficient documentation

## 2013-10-24 DIAGNOSIS — IMO0002 Reserved for concepts with insufficient information to code with codable children: Secondary | ICD-10-CM | POA: Insufficient documentation

## 2013-10-24 DIAGNOSIS — S0990XA Unspecified injury of head, initial encounter: Secondary | ICD-10-CM | POA: Insufficient documentation

## 2013-10-24 DIAGNOSIS — G8929 Other chronic pain: Secondary | ICD-10-CM | POA: Insufficient documentation

## 2013-10-24 DIAGNOSIS — S0001XA Abrasion of scalp, initial encounter: Secondary | ICD-10-CM

## 2013-10-24 DIAGNOSIS — Z79899 Other long term (current) drug therapy: Secondary | ICD-10-CM | POA: Insufficient documentation

## 2013-10-24 DIAGNOSIS — Y929 Unspecified place or not applicable: Secondary | ICD-10-CM | POA: Insufficient documentation

## 2013-10-24 DIAGNOSIS — Z87891 Personal history of nicotine dependence: Secondary | ICD-10-CM | POA: Insufficient documentation

## 2013-10-24 DIAGNOSIS — Z860100 Personal history of colon polyps, unspecified: Secondary | ICD-10-CM | POA: Insufficient documentation

## 2013-10-24 DIAGNOSIS — Z8739 Personal history of other diseases of the musculoskeletal system and connective tissue: Secondary | ICD-10-CM | POA: Insufficient documentation

## 2013-10-24 DIAGNOSIS — Z86711 Personal history of pulmonary embolism: Secondary | ICD-10-CM | POA: Insufficient documentation

## 2013-10-24 DIAGNOSIS — Z87442 Personal history of urinary calculi: Secondary | ICD-10-CM | POA: Insufficient documentation

## 2013-10-24 DIAGNOSIS — Y9389 Activity, other specified: Secondary | ICD-10-CM | POA: Insufficient documentation

## 2013-10-24 DIAGNOSIS — K219 Gastro-esophageal reflux disease without esophagitis: Secondary | ICD-10-CM | POA: Insufficient documentation

## 2013-10-24 DIAGNOSIS — Z8669 Personal history of other diseases of the nervous system and sense organs: Secondary | ICD-10-CM | POA: Insufficient documentation
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
## 2013-10-24 NOTE — ED Provider Notes (Signed)
Medical screening examination/treatment/procedure(s) were performed by non-physician practitioner and as supervising physician I was immediately available for consultation/collaboration.   EKG Interpretation None        Osvaldo Shipper, MD 10/24/13 [phone]

## 2013-10-24 NOTE — Discharge Instructions (Signed)
Head Injury, Adult You have a head injury. Headaches and throwing up (vomiting) are common after a head injury. It should be easy to wake up from sleeping. Sometimes you must stay in the hospital. Most problems happen within the first 24 hours. Side effects may occur up to 7 10 days after the injury.  WHAT ARE THE TYPES OF HEAD INJURIES? Head injuries can be as minor as a bump. Some head injuries can be more severe. More severe head injuries include:  A jarring injury to the brain (concussion).  A bruise of the brain (contusion). This mean there is bleeding in the brain that can cause swelling.  A cracked skull (skull fracture).  Bleeding in the brain that collects, clots, and forms a bump (hematoma). . WHEN SHOULD I GET HELP RIGHT AWAY?   You are confused or sleepy.  You cannot be woken up.  You feel sick to your stomach (nauseous) or keep throwing up.  Your dizziness or unsteadiness is get worse.  You have very bad, lasting headaches that are not helped by medicine.  You cannot use your arms or legs like normal  You cannot walk.  You notice changes in the black spots in the center of the colored part of your eye (pupil).  You have clear or bloody fluid coming from your nose or ears.  You have trouble seeing. During the next 24 hours after the injury, you must stay with someone who can watch you. This person should get help right away (call 911 in the [place] if you start to shake and are not able to control it (seizures), you become pass out, or you are unable to wake up. HOW CAN I PREVENT A HEAD INJURY IN THE FUTURE?  Wear seat belts.  Wear helmets while bike riding and playing sports like football.  Stay away from dangerous activities around the house. WHEN CAN I RETURN TO NORMAL ACTIVITIES AND ATHLETICS? See your doctor before doing these activities. You should not do normal activities or play contact sports until 1 week after the following symptoms have  stopped:  Headache that does not go away.  Dizziness.  Poor attention.  Confusion.  Memory problems.  Sickness to your stomach or throwing up.  Tiredness.  Fussiness.  Bothered by bright lights or loud noises.  Anxiousness or depression.  Restless sleep. MAKE SURE YOU:   Understand these instructions.  Will watch your condition.  Will get help right away if you are not doing well or get worse. Document Released: 04/26/2008 Document Revised: 03/04/2013 Document Reviewed: 01/19/2013 [facility] Patient Information 2014 [employer].
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
## 2013-11-20 ENCOUNTER — Other Ambulatory Visit: Payer: Self-pay | Admitting: Family Medicine

## 2013-12-22 ENCOUNTER — Other Ambulatory Visit: Payer: Self-pay | Admitting: Family Medicine

## 2014-01-04 ENCOUNTER — Encounter: Payer: Self-pay | Admitting: Gastroenterology

## 2014-01-06 ENCOUNTER — Other Ambulatory Visit: Payer: Self-pay | Admitting: Family Medicine

## 2014-01-07 ENCOUNTER — Encounter: Payer: Self-pay | Admitting: Emergency Medicine

## 2014-01-07 ENCOUNTER — Other Ambulatory Visit: Payer: Self-pay | Admitting: Family Medicine

## 2014-01-07 ENCOUNTER — Ambulatory Visit: Payer: 59 | Admitting: Physician Assistant

## 2014-01-07 ENCOUNTER — Emergency Department (INDEPENDENT_AMBULATORY_CARE_PROVIDER_SITE_OTHER)
Admission: EM | Admit: 2014-01-07 | Discharge: 2014-01-07 | Disposition: A | Discharge status: Home / Self Care | Payer: 59 | Source: Home / Self Care | Attending: Emergency Medicine | Admitting: Emergency Medicine

## 2014-01-07 DIAGNOSIS — J209 Acute bronchitis, unspecified: Secondary | ICD-10-CM | POA: Diagnosis not present

## 2014-01-07 MED ORDER — PROMETHAZINE-CODEINE 6.25-10 MG/5ML PO SYRP: ORAL_SOLUTION | ORAL | Status: DC

## 2014-01-07 MED ORDER — AZITHROMYCIN 250 MG PO TABS: ORAL_TABLET | ORAL | Status: DC
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
## 2014-01-07 NOTE — ED Notes (Signed)
[name] complains of fever, sweats, body aches, sore throat, congestion, mild cough with green sputum, stomach pains and nausea for 5 days.

## 2014-01-07 NOTE — Telephone Encounter (Signed)
Patient's wife states that prior physician wrote the Rx for refills for a whole year. She would like to know if Dr. Anitra Lauth can do the same. Please call her.

## 2014-01-08 MED ORDER — OMEPRAZOLE 20 MG PO CPDR: DELAYED_RELEASE_CAPSULE | ORAL | Status: DC

## 2014-01-08 NOTE — Addendum Note (Signed)
Addended by: Ralph Dowdy on: 01/08/2014 08:37 AM   Modules accepted: Orders

## 2014-01-08 NOTE — Telephone Encounter (Signed)
OK for omeprazole to be done for 1 mo supply with #11 RFs.-thx

## 2014-01-08 NOTE — Telephone Encounter (Signed)
He hasn't been to office since 04/20/2013.  Please advise.

## 2014-02-05 ENCOUNTER — Other Ambulatory Visit: Payer: Self-pay | Admitting: Family Medicine

## 2014-02-08 NOTE — Telephone Encounter (Signed)
Pt requesting RF of celexa.  Patient hasn't been seen by you since 04/20/2013.  Please advise rf.

## 2014-02-11 ENCOUNTER — Encounter: Payer: Self-pay | Admitting: Family Medicine

## 2014-02-11 ENCOUNTER — Ambulatory Visit (INDEPENDENT_AMBULATORY_CARE_PROVIDER_SITE_OTHER): Payer: 59 | Admitting: Family Medicine

## 2014-02-11 VITALS — BP 117/82 | HR 81 | Temp 98.7°F | Resp 18 | Ht 69.0 in | Wt 225.0 lb

## 2014-02-11 DIAGNOSIS — E663 Overweight: Secondary | ICD-10-CM

## 2014-02-11 DIAGNOSIS — K219 Gastro-esophageal reflux disease without esophagitis: Secondary | ICD-10-CM | POA: Diagnosis not present

## 2014-02-11 DIAGNOSIS — Z0000 Encounter for general adult medical examination without abnormal findings: Secondary | ICD-10-CM

## 2014-02-11 LAB — CBC WITH DIFFERENTIAL/PLATELET
Basophils Absolute: 0 10*3/uL (ref 0.0–0.1)
Basophils Relative: 0.6 % (ref 0.0–3.0)
Eosinophils Absolute: 0 10*3/uL (ref 0.0–0.7)
Eosinophils Relative: 0 % (ref 0.0–5.0)
HCT: 43.6 % (ref 39.0–52.0)
HEMOGLOBIN: 14.9 g/dL (ref 13.0–17.0)
LYMPHS PCT: 46.9 % — AB (ref 12.0–46.0)
Lymphs Abs: 2.4 10*3/uL (ref 0.7–4.0)
MCHC: 34.2 g/dL (ref 30.0–36.0)
MCV: 89.2 fl (ref 78.0–100.0)
MONOS PCT: 7.2 % (ref 3.0–12.0)
Monocytes Absolute: 0.4 10*3/uL (ref 0.1–1.0)
NEUTROS ABS: 2.3 10*3/uL (ref 1.4–7.7)
NEUTROS PCT: 45.3 % (ref 43.0–77.0)
Platelets: 283 10*3/uL (ref 150.0–400.0)
RBC: 4.88 Mil/uL (ref 4.22–5.81)
RDW: 13.3 % (ref 11.5–15.5)
WBC: 5.1 10*3/uL (ref 4.0–10.5)

## 2014-02-11 LAB — LIPID PANEL
Cholesterol: 173 mg/dL (ref 0–200)
HDL: 32.8 mg/dL — ABNORMAL LOW (ref >39.00)
LDL Cholesterol: 108 mg/dL — ABNORMAL HIGH (ref 0–99)
NONHDL: 140.2
Total CHOL/HDL Ratio: 5
Triglycerides: 160 mg/dL — ABNORMAL HIGH (ref 0.0–149.0)
VLDL: 32 mg/dL (ref 0.0–40.0)

## 2014-02-11 LAB — COMPREHENSIVE METABOLIC PANEL
ALBUMIN: 4.4 g/dL (ref 3.5–5.2)
ALT: 31 U/L (ref 0–53)
AST: 20 U/L (ref 0–37)
Alkaline Phosphatase: 44 U/L (ref 39–117)
BUN: 15 mg/dL (ref 6–23)
CHLORIDE: 103 meq/L (ref 96–112)
CO2: 29 meq/L (ref 19–32)
Calcium: 9.5 mg/dL (ref 8.4–10.5)
Creatinine, Ser: 1 mg/dL (ref 0.4–1.5)
GFR: 85.62 mL/min (ref >60.00)
Glucose, Bld: 88 mg/dL (ref 70–99)
POTASSIUM: 4.2 meq/L (ref 3.5–5.1)
Sodium: 139 mEq/L (ref 135–145)
TOTAL PROTEIN: 7.4 g/dL (ref 6.0–8.3)
Total Bilirubin: 0.6 mg/dL (ref 0.2–1.2)

## 2014-02-11 LAB — TSH: TSH: 1.68 u[IU]/mL (ref 0.35–4.50)

## 2014-02-11 LAB — HEMOGLOBIN A1C: Hgb A1c MFr Bld: 5.5 % (ref 4.6–6.5)

## 2014-02-11 MED ORDER — OMEPRAZOLE 20 MG PO CPDR: DELAYED_RELEASE_CAPSULE | ORAL | Status: DC

## 2014-02-11 MED ORDER — CYCLOBENZAPRINE HCL 10 MG PO TABS
10.0000 mg | ORAL_TABLET | Freq: Every evening | ORAL | Status: DC | PRN
Start: 2014-02-11 — End: 2017-09-26

## 2014-02-11 MED ORDER — FLUTICASONE PROPIONATE 50 MCG/ACT NA SUSP: 2.0000 | Freq: Every day | NASAL | Status: DC

## 2014-02-11 MED ORDER — DESONIDE 0.05 % EX CREA: TOPICAL_CREAM | CUTANEOUS | Status: DC

## 2014-02-11 NOTE — Assessment & Plan Note (Signed)
Stable. I encouraged pt to lose wt, be more aggressive with dietary changes, try to do a trial period weening off of omeprazole (may use OTC H2 blockers prn).  Warned him of potential adverse effects of longterm use of PPIs.

## 2014-02-11 NOTE — Assessment & Plan Note (Addendum)
Reviewed age and gender appropriate health maintenance issues (prudent diet, regular exercise, health risks of tobacco and excessive alcohol, use of seatbelts, fire alarms in home, use of sunscreen).  Also reviewed age and gender appropriate health screening as well as vaccine recommendations. HP + HbA1c today as per his insurer's request (he has a form for me to sign today with request for lipid, glucose, and HbA1c results). He declined flu vaccine today.
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
## 2014-02-11 NOTE — Progress Notes (Signed)
Pre visit review using our clinic review tool, if applicable. No additional management support is needed unless otherwise documented below in the visit note. 

## 2014-03-10 ENCOUNTER — Other Ambulatory Visit: Payer: Self-pay | Admitting: Family Medicine

## 2014-03-27 ENCOUNTER — Emergency Department (HOSPITAL_COMMUNITY): Payer: 59

## 2014-03-27 ENCOUNTER — Emergency Department (HOSPITAL_COMMUNITY)
Admission: EM | Admit: 2014-03-27 | Discharge: 2014-03-27 | Disposition: A | Discharge status: Home / Self Care | Payer: 59 | Attending: Emergency Medicine | Admitting: Emergency Medicine

## 2014-03-27 ENCOUNTER — Encounter (HOSPITAL_COMMUNITY): Payer: Self-pay | Admitting: Emergency Medicine

## 2014-03-27 DIAGNOSIS — Z86711 Personal history of pulmonary embolism: Secondary | ICD-10-CM | POA: Diagnosis not present

## 2014-03-27 DIAGNOSIS — R079 Chest pain, unspecified: Secondary | ICD-10-CM | POA: Insufficient documentation

## 2014-03-27 DIAGNOSIS — Z79899 Other long term (current) drug therapy: Secondary | ICD-10-CM | POA: Diagnosis not present

## 2014-03-27 DIAGNOSIS — F419 Anxiety disorder, unspecified: Secondary | ICD-10-CM | POA: Diagnosis not present

## 2014-03-27 DIAGNOSIS — K219 Gastro-esophageal reflux disease without esophagitis: Secondary | ICD-10-CM | POA: Insufficient documentation

## 2014-03-27 DIAGNOSIS — R110 Nausea: Secondary | ICD-10-CM | POA: Insufficient documentation

## 2014-03-27 DIAGNOSIS — Z87891 Personal history of nicotine dependence: Secondary | ICD-10-CM | POA: Diagnosis not present

## 2014-03-27 DIAGNOSIS — R0602 Shortness of breath: Secondary | ICD-10-CM | POA: Insufficient documentation

## 2014-03-27 DIAGNOSIS — Z87442 Personal history of urinary calculi: Secondary | ICD-10-CM | POA: Diagnosis not present

## 2014-03-27 DIAGNOSIS — Z86010 Personal history of colonic polyps: Secondary | ICD-10-CM | POA: Diagnosis not present

## 2014-03-27 DIAGNOSIS — G8929 Other chronic pain: Secondary | ICD-10-CM | POA: Diagnosis not present

## 2014-03-27 DIAGNOSIS — R61 Generalized hyperhidrosis: Secondary | ICD-10-CM | POA: Diagnosis not present

## 2014-03-27 DIAGNOSIS — Z7951 Long term (current) use of inhaled steroids: Secondary | ICD-10-CM | POA: Insufficient documentation

## 2014-03-27 LAB — I-STAT TROPONIN, ED
TROPONIN I, POC: 0.02 ng/mL (ref 0.00–0.08)
Troponin i, poc: 0.01 ng/mL (ref 0.00–0.08)

## 2014-03-27 LAB — CBC
HCT: 41.4 % (ref 39.0–52.0)
Hemoglobin: 14.5 g/dL (ref 13.0–17.0)
MCH: 30.5 pg (ref 26.0–34.0)
MCHC: 35 g/dL (ref 30.0–36.0)
MCV: 87 fL (ref 78.0–100.0)
PLATELETS: 253 10*3/uL (ref 150–400)
RBC: 4.76 MIL/uL (ref 4.22–5.81)
RDW: 12.4 % (ref 11.5–15.5)
WBC: 4.6 10*3/uL (ref 4.0–10.5)

## 2014-03-27 LAB — BASIC METABOLIC PANEL
ANION GAP: 13 (ref 5–15)
BUN: 18 mg/dL (ref 6–23)
CHLORIDE: 105 meq/L (ref 96–112)
CO2: 22 mEq/L (ref 19–32)
Calcium: 9.4 mg/dL (ref 8.4–10.5)
Creatinine, Ser: 0.98 mg/dL (ref 0.50–1.35)
Glucose, Bld: 81 mg/dL (ref 70–99)
POTASSIUM: 4.1 meq/L (ref 3.7–5.3)
SODIUM: 140 meq/L (ref 137–147)

## 2014-03-27 LAB — TROPONIN I: Troponin I: <0.3 ng/mL (ref <0.30)

## 2014-03-27 LAB — D-DIMER, QUANTITATIVE: D-Dimer, Quant: <0.27 ug/mL-FEU (ref 0.00–0.48)

## 2014-03-27 NOTE — ED Provider Notes (Signed)
I saw and evaluated the patient, reviewed the resident's note and I agree with the findings and plan.   EKG Interpretation   Date/Time:  Saturday March 27 2014 15:37:11 EDT Ventricular Rate:  70 PR Interval:  184 QRS Duration: 86 QT Interval:  374 QTC Calculation: 403 R Axis:   69 Text Interpretation:  Sinus rhythm ST elev, probable normal early repol  pattern compared to most recent, now has nonspecific ST changes.  Similar  Nonspecific ST changes seen on prior ECGs Confirmed by [facility]  MD, TREY  [phone]) on 03/27/2014 4:52:11 PM      43 yo male with sudden onset of lightheadedness and diaphoresis while out working in the yard.  He subsequently developed substernal chest pain. He denied shortness of breath.  On exam, well appearing, nontoxic, not distressed, normal respiratory effort, normal perfusion, heart sounds normal with regular rate and rhythm, clear to auscultation bilaterally.  Story is atypical for ACS and he is at low risk for ACS. Plan to rule out MI with delta troponin.  Unlikely PE and d-dimer negative. If delta troponin is negative and he remains stable during ED course, suspect that he will be appropriate for discharge and outpatient follow-up.  Clinical Impression: 1. Chest pain       Artis Delay, MD 03/28/14 0005
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
## 2014-03-27 NOTE — ED Notes (Signed)
Per EMS- Pt comes from home where he was doing yard work and had near syncopal episode. He stayed standing the entire time but his vision went black. After it subsided had CP and SOB. CP 2/10 when EMS arrived, given 324 aspirin and 2 nitro with pain relief. Has hx of anxiety. This week changed from prilosec to zantac, also increased his protein this week. BP 120/76, EKG SR, CBG WNL.

## 2014-03-27 NOTE — ED Notes (Signed)
Pt undressed, in gown, on monitor, continuous pulse oximetry and blood pressure cuff 

## 2014-03-27 NOTE — Discharge Instructions (Signed)
1. See PCP in 1 week 2. Come back if worsening symptoms Chest Pain (Nonspecific) It is often hard to give a specific diagnosis for the cause of chest pain. There is always a chance that your pain could be related to something serious, such as a heart attack or a blood clot in the lungs. You need to follow up with your health care provider for further evaluation. CAUSES   Heartburn.  Pneumonia or bronchitis.  Anxiety or stress.  Inflammation around your heart (pericarditis) or lung (pleuritis or pleurisy).  A blood clot in the lung.  A collapsed lung (pneumothorax). It can develop suddenly on its own (spontaneous pneumothorax) or from trauma to the chest.  Shingles infection (herpes zoster virus). The chest wall is composed of bones, muscles, and cartilage. Any of these can be the source of the pain.  The bones can be bruised by injury.  The muscles or cartilage can be strained by coughing or overwork.  The cartilage can be affected by inflammation and become sore (costochondritis). DIAGNOSIS  Lab tests or other studies may be needed to find the cause of your pain. Your health care provider may have you take a test called an ambulatory electrocardiogram (ECG). An ECG records your heartbeat patterns over a 24-hour period. You may also have other tests, such as:  Transthoracic echocardiogram (TTE). During echocardiography, sound waves are used to evaluate how blood flows through your heart.  Transesophageal echocardiogram (TEE).  Cardiac monitoring. This allows your health care provider to monitor your heart rate and rhythm in real time.  Holter monitor. This is a portable device that records your heartbeat and can help diagnose heart arrhythmias. It allows your health care provider to track your heart activity for several days, if needed.  Stress tests by exercise or by giving medicine that makes the heart beat faster. TREATMENT   Treatment depends on what may be causing your  chest pain. Treatment may include:  Acid blockers for heartburn.  Anti-inflammatory medicine.  Pain medicine for inflammatory conditions.  Antibiotics if an infection is present.  You may be advised to change lifestyle habits. This includes stopping smoking and avoiding alcohol, caffeine, and chocolate.  You may be advised to keep your head raised (elevated) when sleeping. This reduces the chance of acid going backward from your stomach into your esophagus. Most of the time, nonspecific chest pain will improve within 2-3 days with rest and mild pain medicine.  HOME CARE INSTRUCTIONS   If antibiotics were prescribed, take them as directed. Finish them even if you start to feel better.  For the next few days, avoid physical activities that bring on chest pain. Continue physical activities as directed.  Do not use any tobacco products, including cigarettes, chewing tobacco, or electronic cigarettes.  Avoid drinking alcohol.  Only take medicine as directed by your health care provider.  Follow your health care provider's suggestions for further testing if your chest pain does not go away.  Keep any follow-up appointments you made. If you do not go to an appointment, you could develop lasting (chronic) problems with pain. If there is any problem keeping an appointment, call to reschedule. SEEK MEDICAL CARE IF:   Your chest pain does not go away, even after treatment.  You have a rash with blisters on your chest.  You have a fever. SEEK IMMEDIATE MEDICAL CARE IF:   You have increased chest pain or pain that spreads to your arm, neck, jaw, back, or abdomen.  You have  shortness of breath.  You have an increasing cough, or you cough up blood.  You have severe back or abdominal pain.  You feel nauseous or vomit.  You have severe weakness.  You faint.  You have chills. This is an emergency. Do not wait to see if the pain will go away. Get medical help at once. Call your  local emergency services (911 in [place] Do not drive yourself to the hospital. MAKE SURE YOU:   Understand these instructions.  Will watch your condition.  Will get help right away if you are not doing well or get worse. Document Released: 02/21/2005 Document Revised: 05/19/2013 Document Reviewed: 12/18/2007 [facility] Patient Information 2015 [place], [place]. This information is not intended to replace advice given to you by your health care provider. Make sure you discuss any questions you have with your health care provider.

## 2014-03-28 NOTE — ED Provider Notes (Signed)
I saw and evaluated the patient, reviewed the resident's note and I agree with the findings and plan.   EKG Interpretation   Date/Time:  Saturday March 27 2014 15:37:11 EDT Ventricular Rate:  70 PR Interval:  184 QRS Duration: 86 QT Interval:  374 QTC Calculation: 403 R Axis:   69 Text Interpretation:  Sinus rhythm ST elev, probable normal early repol  pattern compared to most recent, now has nonspecific ST changes.  Similar  Nonspecific ST changes seen on prior ECGs Confirmed by [facility]  MD, TREY  ([id number]) on 03/27/2014 4:52:11 PM         Artis Delay, MD 03/28/14 0005

## 2014-04-15 ENCOUNTER — Other Ambulatory Visit: Payer: Self-pay | Admitting: Family Medicine

## 2014-05-07 ENCOUNTER — Encounter: Payer: Self-pay | Admitting: Family Medicine

## 2014-05-07 ENCOUNTER — Ambulatory Visit (INDEPENDENT_AMBULATORY_CARE_PROVIDER_SITE_OTHER): Payer: 59 | Admitting: Family Medicine

## 2014-05-07 VITALS — BP 112/79 | HR 76 | Temp 98.0°F | Ht 69.0 in | Wt 222.0 lb

## 2014-05-07 DIAGNOSIS — J209 Acute bronchitis, unspecified: Secondary | ICD-10-CM

## 2014-05-07 DIAGNOSIS — J029 Acute pharyngitis, unspecified: Secondary | ICD-10-CM

## 2014-05-07 DIAGNOSIS — J018 Other acute sinusitis: Secondary | ICD-10-CM

## 2014-05-07 MED ORDER — ALBUTEROL SULFATE HFA 108 (90 BASE) MCG/ACT IN AERS: 1.0000 | INHALATION_SPRAY | RESPIRATORY_TRACT | Status: DC | PRN

## 2014-05-07 MED ORDER — PREDNISONE 20 MG PO TABS: ORAL_TABLET | ORAL | Status: DC

## 2014-05-07 MED ORDER — AMOXICILLIN 875 MG PO TABS: 875.0000 mg | ORAL_TABLET | Freq: Two times a day (BID) | ORAL | Status: AC

## 2014-05-07 MED ORDER — AMOXICILLIN 875 MG PO TABS: 875.0000 mg | ORAL_TABLET | Freq: Two times a day (BID) | ORAL | Status: DC

## 2014-05-07 NOTE — Progress Notes (Signed)
Pre visit review using our clinic review tool, if applicable. No additional management support is needed unless otherwise documented below in the visit note. 

## 2014-05-07 NOTE — Progress Notes (Signed)
OFFICE NOTE  05/07/2014  CC:  Chief Complaint  Patient presents with  . Cough   HPI: Patient is a 43 y.o. Caucasian male who is here for cough. Illness onset 10+ days ago with achiness, followed by severe ST for 3-4d, then nasal congestion, PND, lots of coughing and now the cough is the worst part of the illness.  No fevers.  Rare nausea but no vomiting.  No diarrhea. Trying dayquil, nyquil, alka selzter cold.  Pertinent PMH:  Past medical, surgical, social, and family history reviewed and no changes noted since last office visit.  MEDS:  Outpatient Prescriptions Prior to Visit  Medication Sig Dispense Refill  . citalopram (CELEXA) 20 MG tablet TAKE 1 TABLET BY MOUTH EVERY DAY 30 tablet 5  . citalopram (CELEXA) 20 MG tablet TAKE 1 TABLET BY MOUTH EVERY DAY 30 tablet 0  . cyclobenzaprine (FLEXERIL) 10 MG tablet Take 1 tablet (10 mg total) by mouth at bedtime as needed for muscle spasms. 30 tablet 3  . fluticasone (FLONASE) 50 MCG/ACT nasal spray Place 2 sprays into both nostrils daily. 16 g 12  . ibuprofen (ADVIL,MOTRIN) 800 MG tablet Take 1 tablet (800 mg total) by mouth every 8 (eight) hours as needed for mild pain or moderate pain. 60 tablet 6  . ranitidine (ZANTAC) 150 MG tablet Take 150 mg by mouth 2 (two) times daily.     No facility-administered medications prior to visit.    PE: Blood pressure 112/79, pulse 76, temperature 98 F (36.7 C), temperature source Temporal, height 5\' 9"  (1.753 m), weight 222 lb (100.699 kg), SpO2 97 %. VS: noted--normal. Gen: alert, NAD, NONTOXIC APPEARING. HEENT: eyes without injection, drainage, or swelling.  Ears: EACs clear, TMs with normal light reflex and landmarks.  Nose: Clear rhinorrhea, with some dried, crusty exudate adherent to mildly injected mucosa.  No purulent d/c.  Diffuse paranasal sinus TTP.  No facial swelling.  Throat and mouth without focal lesion.  No pharyngial swelling, erythema, or exudate.   Neck: supple, no LAD.    LUNGS: CTA bilat, nonlabored resps.  Some post-exhalation coughing. CV: RRR, no m/r/g. EXT: no c/c/e SKIN: no rash  LAB: rapid strep today was negative  IMPRESSION AND PLAN:  Prolonged URI/acute sinusitis, with acute asthmatic bronchitis. Amoxil 875mg  bid x 10d, prednisone 40mg  qd x 5d, ventolin HFA 2 puffs q4h prn. Robitussin DM prn. Group A strep clx sent.  An After Visit Summary was printed and given to the patient.  FOLLOW UP: prn

## 2014-05-17 ENCOUNTER — Emergency Department (HOSPITAL_BASED_OUTPATIENT_CLINIC_OR_DEPARTMENT_OTHER): Payer: 59

## 2014-05-17 ENCOUNTER — Encounter (HOSPITAL_BASED_OUTPATIENT_CLINIC_OR_DEPARTMENT_OTHER): Payer: Self-pay | Admitting: *Deleted

## 2014-05-17 ENCOUNTER — Emergency Department (HOSPITAL_BASED_OUTPATIENT_CLINIC_OR_DEPARTMENT_OTHER)
Admission: EM | Admit: 2014-05-17 | Discharge: 2014-05-17 | Disposition: A | Discharge status: Home / Self Care | Payer: 59 | Attending: Emergency Medicine | Admitting: Emergency Medicine

## 2014-05-17 DIAGNOSIS — S1093XA Contusion of unspecified part of neck, initial encounter: Secondary | ICD-10-CM | POA: Insufficient documentation

## 2014-05-17 DIAGNOSIS — Z7951 Long term (current) use of inhaled steroids: Secondary | ICD-10-CM | POA: Diagnosis not present

## 2014-05-17 DIAGNOSIS — Y9389 Activity, other specified: Secondary | ICD-10-CM | POA: Diagnosis not present

## 2014-05-17 DIAGNOSIS — Z87891 Personal history of nicotine dependence: Secondary | ICD-10-CM | POA: Diagnosis not present

## 2014-05-17 DIAGNOSIS — S199XXA Unspecified injury of neck, initial encounter: Secondary | ICD-10-CM

## 2014-05-17 DIAGNOSIS — Z87442 Personal history of urinary calculi: Secondary | ICD-10-CM | POA: Insufficient documentation

## 2014-05-17 DIAGNOSIS — Z86711 Personal history of pulmonary embolism: Secondary | ICD-10-CM | POA: Insufficient documentation

## 2014-05-17 DIAGNOSIS — Y998 Other external cause status: Secondary | ICD-10-CM | POA: Diagnosis not present

## 2014-05-17 DIAGNOSIS — Z79899 Other long term (current) drug therapy: Secondary | ICD-10-CM | POA: Insufficient documentation

## 2014-05-17 DIAGNOSIS — Y9289 Other specified places as the place of occurrence of the external cause: Secondary | ICD-10-CM | POA: Diagnosis not present

## 2014-05-17 DIAGNOSIS — X58XXXA Exposure to other specified factors, initial encounter: Secondary | ICD-10-CM | POA: Diagnosis not present

## 2014-05-17 DIAGNOSIS — K219 Gastro-esophageal reflux disease without esophagitis: Secondary | ICD-10-CM | POA: Insufficient documentation

## 2014-05-17 DIAGNOSIS — Z86010 Personal history of colonic polyps: Secondary | ICD-10-CM | POA: Diagnosis not present

## 2014-05-17 DIAGNOSIS — Z8669 Personal history of other diseases of the nervous system and sense organs: Secondary | ICD-10-CM | POA: Diagnosis not present

## 2014-05-17 DIAGNOSIS — F419 Anxiety disorder, unspecified: Secondary | ICD-10-CM | POA: Diagnosis not present

## 2014-05-17 MED ORDER — PROMETHAZINE HCL 25 MG/ML IJ SOLN
25.0000 mg | Freq: Once | INTRAMUSCULAR | Status: AC
  Administered 2014-05-17: 25 mg via INTRAMUSCULAR
  Filled 2014-05-17: qty 1

## 2014-05-17 MED ORDER — KETOROLAC TROMETHAMINE 30 MG/ML IJ SOLN
60.0000 mg | Freq: Once | INTRAMUSCULAR | Status: AC
  Administered 2014-05-17: 60 mg via INTRAMUSCULAR
  Filled 2014-05-17: qty 2

## 2014-05-17 NOTE — Discharge Instructions (Signed)
Ibuprofen 800 mg every 8 hours as needed for pain.  Follow-up with your primary Dr. if not improving in the next week, and return to the ER if your symptoms substantially worsen or change.   Cervical Sprain A cervical sprain is an injury in the neck in which the strong, fibrous tissues (ligaments) that connect your neck bones stretch or tear. Cervical sprains can range from mild to severe. Severe cervical sprains can cause the neck vertebrae to be unstable. This can lead to damage of the spinal cord and can result in serious nervous system problems. The amount of time it takes for a cervical sprain to get better depends on the cause and extent of the injury. Most cervical sprains heal in 1 to 3 weeks. CAUSES  Severe cervical sprains may be caused by:   Contact sport injuries (such as from football, rugby, wrestling, hockey, auto racing, gymnastics, diving, martial arts, or boxing).   Motor vehicle collisions.   Whiplash injuries. This is an injury from a sudden forward and backward whipping movement of the head and neck.  Falls.  Mild cervical sprains may be caused by:   Being in an awkward position, such as while cradling a telephone between your ear and shoulder.   Sitting in a chair that does not offer proper support.   Working at a poorly Landscape [employer] station.   Looking up or down for long periods of time.  SYMPTOMS   Pain, soreness, stiffness, or a burning sensation in the front, back, or sides of the neck. This discomfort may develop immediately after the injury or slowly, 24 hours or more after the injury.   Pain or tenderness directly in the middle of the back of the neck.   Shoulder or upper back pain.   Limited ability to move the neck.   Headache.   Dizziness.   Weakness, numbness, or tingling in the hands or arms.   Muscle spasms.   Difficulty swallowing or chewing.   Tenderness and swelling of the neck.  DIAGNOSIS  Most of the time  your health care provider can diagnose a cervical sprain by taking your history and doing a physical exam. Your health care provider will ask about previous neck injuries and any known neck problems, such as arthritis in the neck. X-rays may be taken to find out if there are any other problems, such as with the bones of the neck. Other tests, such as a CT scan or MRI, may also be needed.  TREATMENT  Treatment depends on the severity of the cervical sprain. Mild sprains can be treated with rest, keeping the neck in place (immobilization), and pain medicines. Severe cervical sprains are immediately immobilized. Further treatment is done to help with pain, muscle spasms, and other symptoms and may include:  Medicines, such as pain relievers, numbing medicines, or muscle relaxants.   Physical therapy. This may involve stretching exercises, strengthening exercises, and posture training. Exercises and improved posture can help stabilize the neck, strengthen muscles, and help stop symptoms from returning.  HOME CARE INSTRUCTIONS   Put ice on the injured area.   Put ice in a plastic bag.   Place a towel between your skin and the bag.   Leave the ice on for 15-20 minutes, 3-4 times a day.   If your injury was severe, you may have been given a cervical collar to wear. A cervical collar is a two-piece collar designed to keep your neck from moving while it heals.  Do  not remove the collar unless instructed by your health care provider.  If you have long hair, keep it outside of the collar.  Ask your health care provider before making any adjustments to your collar. Minor adjustments may be required over time to improve comfort and reduce pressure on your chin or on the back of your head.  Ifyou are allowed to remove the collar for cleaning or bathing, follow your health care provider's instructions on how to do so safely.  Keep your collar clean by wiping it with mild soap and water and drying  it completely. If the collar you have been given includes removable pads, remove them every 1-2 days and hand wash them with soap and water. Allow them to air dry. They should be completely dry before you wear them in the collar.  If you are allowed to remove the collar for cleaning and bathing, wash and dry the skin of your neck. Check your skin for irritation or sores. If you see any, tell your health care provider.  Do not drive while wearing the collar.   Only take over-the-counter or prescription medicines for pain, discomfort, or fever as directed by your health care provider.   Keep all follow-up appointments as directed by your health care provider.   Keep all physical therapy appointments as directed by your health care provider.   Make any needed adjustments to your workstation to promote good posture.   Avoid positions and activities that make your symptoms worse.   Warm up and stretch before being active to help prevent problems.  SEEK MEDICAL CARE IF:   Your pain is not controlled with medicine.   You are unable to decrease your pain medicine over time as planned.   Your activity level is not improving as expected.  SEEK IMMEDIATE MEDICAL CARE IF:   You develop any bleeding.  You develop stomach upset.  You have signs of an allergic reaction to your medicine.   Your symptoms get worse.   You develop new, unexplained symptoms.   You have numbness, tingling, weakness, or paralysis in any part of your body.  MAKE SURE YOU:   Understand these instructions.  Will watch your condition.  Will get help right away if you are not doing well or get worse. Document Released: 03/11/2007 Document Revised: 05/19/2013 Document Reviewed: 11/19/2012 [facility] Patient Information 2015 [place], [place]. This information is not intended to replace advice given to you by your health care provider. Make sure you discuss any questions you have with your health care  provider.  Contusion A contusion is a deep bruise. Contusions are the result of an injury that caused bleeding under the skin. The contusion may turn blue, purple, or yellow. Minor injuries will give you a painless contusion, but more severe contusions may stay painful and swollen for a few weeks.  CAUSES  A contusion is usually caused by a blow, trauma, or direct force to an area of the body. SYMPTOMS   Swelling and redness of the injured area.  Bruising of the injured area.  Tenderness and soreness of the injured area.  Pain. DIAGNOSIS  The diagnosis can be made by taking a history and physical exam. An X-ray, CT scan, or MRI may be needed to determine if there were any associated injuries, such as fractures. TREATMENT  Specific treatment will depend on what area of the body was injured. In general, the best treatment for a contusion is resting, icing, elevating, and applying cold compresses to  the injured area. Over-the-counter medicines may also be recommended for pain control. Ask your caregiver what the best treatment is for your contusion. HOME CARE INSTRUCTIONS   Put ice on the injured area.  Put ice in a plastic bag.  Place a towel between your skin and the bag.  Leave the ice on for 15-20 minutes, 3-4 times a day, or as directed by your health care provider.  Only take over-the-counter or prescription medicines for pain, discomfort, or fever as directed by your caregiver. Your caregiver may recommend avoiding anti-inflammatory medicines (aspirin, ibuprofen, and naproxen) for 48 hours because these medicines may increase bruising.  Rest the injured area.  If possible, elevate the injured area to reduce swelling. SEEK IMMEDIATE MEDICAL CARE IF:   You have increased bruising or swelling.  You have pain that is getting worse.  Your swelling or pain is not relieved with medicines. MAKE SURE YOU:   Understand these instructions.  Will watch your condition.  Will get  help right away if you are not doing well or get worse. Document Released: 02/21/2005 Document Revised: 05/19/2013 Document Reviewed: 03/19/2011 [facility] Patient Information 2015 [place], [place]. This information is not intended to replace advice given to you by your health care provider. Make sure you discuss any questions you have with your health care provider.
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
## 2014-05-17 NOTE — ED Notes (Signed)
Patient transported to X-ray 
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
## 2014-05-19 ENCOUNTER — Telehealth: Payer: Self-pay | Admitting: Family Medicine
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
## 2014-05-19 NOTE — Telephone Encounter (Signed)
Please advise 

## 2014-05-20 ENCOUNTER — Ambulatory Visit: Payer: 59 | Admitting: Family Medicine

## 2014-05-20 MED ORDER — PREDNISONE 20 MG PO TABS: ORAL_TABLET | ORAL | Status: DC

## 2014-05-20 MED ORDER — DOXYCYCLINE HYCLATE 100 MG PO CAPS: 100.0000 mg | ORAL_CAPSULE | Freq: Two times a day (BID) | ORAL | Status: DC

## 2014-05-20 NOTE — Telephone Encounter (Signed)
LMOM for pt to CB.  

## 2014-05-20 NOTE — Telephone Encounter (Signed)
Spoke to patient wife.  She said to send Rx's into [facility] and cancel his appointment.

## 2014-05-20 NOTE — Telephone Encounter (Signed)
Pls call pt and tell him I authorized you to send in doxycycline 100 mg bid x 10d, #20, no RF (antibiotic) and prednisone 20mg , 2 tabs po qd x 5d, #10, no RF. I recommend he take mucinex DM OTC if he is not already doing this. Also, I'll put in referral to local ENT for his neck complaint.-thx

## 2014-07-17 ENCOUNTER — Encounter (HOSPITAL_COMMUNITY): Payer: Self-pay | Admitting: Emergency Medicine

## 2014-07-17 ENCOUNTER — Emergency Department (HOSPITAL_COMMUNITY): Payer: 59

## 2014-07-17 ENCOUNTER — Emergency Department (HOSPITAL_COMMUNITY)
Admission: EM | Admit: 2014-07-17 | Discharge: 2014-07-17 | Disposition: A | Discharge status: Home / Self Care | Payer: 59 | Attending: Emergency Medicine | Admitting: Emergency Medicine

## 2014-07-17 DIAGNOSIS — Z87891 Personal history of nicotine dependence: Secondary | ICD-10-CM | POA: Insufficient documentation

## 2014-07-17 DIAGNOSIS — G8929 Other chronic pain: Secondary | ICD-10-CM | POA: Diagnosis not present

## 2014-07-17 DIAGNOSIS — K219 Gastro-esophageal reflux disease without esophagitis: Secondary | ICD-10-CM | POA: Diagnosis not present

## 2014-07-17 DIAGNOSIS — F419 Anxiety disorder, unspecified: Secondary | ICD-10-CM | POA: Insufficient documentation

## 2014-07-17 DIAGNOSIS — Z8669 Personal history of other diseases of the nervous system and sense organs: Secondary | ICD-10-CM | POA: Diagnosis not present

## 2014-07-17 DIAGNOSIS — Z79899 Other long term (current) drug therapy: Secondary | ICD-10-CM | POA: Diagnosis not present

## 2014-07-17 DIAGNOSIS — Z86010 Personal history of colonic polyps: Secondary | ICD-10-CM | POA: Diagnosis not present

## 2014-07-17 DIAGNOSIS — Z792 Long term (current) use of antibiotics: Secondary | ICD-10-CM | POA: Diagnosis not present

## 2014-07-17 DIAGNOSIS — Z86711 Personal history of pulmonary embolism: Secondary | ICD-10-CM | POA: Insufficient documentation

## 2014-07-17 DIAGNOSIS — Z7951 Long term (current) use of inhaled steroids: Secondary | ICD-10-CM | POA: Insufficient documentation

## 2014-07-17 DIAGNOSIS — Z87442 Personal history of urinary calculi: Secondary | ICD-10-CM | POA: Diagnosis not present

## 2014-07-17 DIAGNOSIS — Z8739 Personal history of other diseases of the musculoskeletal system and connective tissue: Secondary | ICD-10-CM | POA: Insufficient documentation

## 2014-07-17 DIAGNOSIS — K5900 Constipation, unspecified: Secondary | ICD-10-CM | POA: Insufficient documentation

## 2014-07-17 LAB — CBC WITH DIFFERENTIAL/PLATELET
Basophils Absolute: 0 10*3/uL (ref 0.0–0.1)
Basophils Relative: 1 % (ref 0–1)
Eosinophils Absolute: 0 10*3/uL (ref 0.0–0.7)
Eosinophils Relative: 0 % (ref 0–5)
HCT: 44.8 % (ref 39.0–52.0)
HEMOGLOBIN: 14.8 g/dL (ref 13.0–17.0)
LYMPHS PCT: 49 % — AB (ref 12–46)
Lymphs Abs: 2.4 10*3/uL (ref 0.7–4.0)
MCH: 29.4 pg (ref 26.0–34.0)
MCHC: 33 g/dL (ref 30.0–36.0)
MCV: 88.9 fL (ref 78.0–100.0)
MONOS PCT: 9 % (ref 3–12)
Monocytes Absolute: 0.4 10*3/uL (ref 0.1–1.0)
Neutro Abs: 2 10*3/uL (ref 1.7–7.7)
Neutrophils Relative %: 41 % — ABNORMAL LOW (ref 43–77)
Platelets: 290 10*3/uL (ref 150–400)
RBC: 5.04 MIL/uL (ref 4.22–5.81)
RDW: 12.6 % (ref 11.5–15.5)
WBC: 4.9 10*3/uL (ref 4.0–10.5)

## 2014-07-17 LAB — COMPREHENSIVE METABOLIC PANEL
ALT: 59 U/L — ABNORMAL HIGH (ref 0–53)
AST: 34 U/L (ref 0–37)
Albumin: 4.6 g/dL (ref 3.5–5.2)
Alkaline Phosphatase: 40 U/L (ref 39–117)
Anion gap: 7 (ref 5–15)
BILIRUBIN TOTAL: 0.7 mg/dL (ref 0.3–1.2)
BUN: 14 mg/dL (ref 6–23)
CO2: 26 mmol/L (ref 19–32)
Calcium: 9.5 mg/dL (ref 8.4–10.5)
Chloride: 104 mmol/L (ref 96–112)
Creatinine, Ser: 0.85 mg/dL (ref 0.50–1.35)
GFR calc Af Amer: >90 mL/min (ref >90)
GFR calc non Af Amer: >90 mL/min (ref >90)
GLUCOSE: 94 mg/dL (ref 70–99)
POTASSIUM: 3.7 mmol/L (ref 3.5–5.1)
Sodium: 137 mmol/L (ref 135–145)
Total Protein: 7.5 g/dL (ref 6.0–8.3)

## 2014-07-17 LAB — LIPASE, BLOOD: LIPASE: 32 U/L (ref 11–59)

## 2014-07-17 NOTE — ED Notes (Signed)
Patient c/o constipation since last Sunday (2/14).  Patient states he has had several small bowel movements over the week, but they were not productive of consistent.  Patient endorses nausea, but denies vomiting.  Patient has not measured his temperature, but endorses chills.  Patient denies blood in stool he did produce.   Patient states he has had some "sharp," RUQ pain and periumbilical pain.  Patient has taken multiple laxatives over the week including OTC Miralax and Ex-lax.  Patient also tried 2 enemas with little/no success.    On exam, patient's abdomen is distended and mildly tender to palpation, >periumbilical area. Patient has hyperactive bowel sounds.  No rebound tenderness noted.  Lung sounds are clear in all lobes, heart sounds WNL with no S3/S4 and rate regular.  +2 radial and pedal pulses.  Patient is alert and oriented x4, PERRL and MAE.  Patient is in no apparent distress.

## 2014-07-17 NOTE — ED Notes (Addendum)
Pt from home c/o constipation. Reports having a small hard BM this am. Nausea present.  He reports using miralax and stool softener.  He also reports eating chili from [employer] and found slimy stuff in chili and has had problems ever since.

## 2014-07-17 NOTE — Discharge Instructions (Signed)
Take 6-8 capfuls of miralax in 24-32 oz of liquid tomorrow.  Constipation Constipation is when a person has fewer than three bowel movements a week, has difficulty having a bowel movement, or has stools that are dry, hard, or larger than normal. As people grow older, constipation is more common. If you try to fix constipation with medicines that make you have a bowel movement (laxatives), the problem may get worse. Long-term laxative use may cause the muscles of the colon to become weak. A low-fiber diet, not taking in enough fluids, and taking certain medicines may make constipation worse.  CAUSES   Certain medicines, such as antidepressants, pain medicine, iron supplements, antacids, and water pills.   Certain diseases, such as diabetes, irritable bowel syndrome (IBS), thyroid disease, or depression.   Not drinking enough water.   Not eating enough fiber-rich foods.   Stress or travel.   Lack of physical activity or exercise.   Ignoring the urge to have a bowel movement.   Using laxatives too much.  SIGNS AND SYMPTOMS   Having fewer than three bowel movements a week.   Straining to have a bowel movement.   Having stools that are hard, dry, or larger than normal.   Feeling full or bloated.   Pain in the lower abdomen.   Not feeling relief after having a bowel movement.  DIAGNOSIS  Your health care provider will take a medical history and perform a physical exam. Further testing may be done for severe constipation. Some tests may include:  A barium enema X-ray to examine your rectum, colon, and, sometimes, your small intestine.   A sigmoidoscopy to examine your lower colon.   A colonoscopy to examine your entire colon. TREATMENT  Treatment will depend on the severity of your constipation and what is causing it. Some dietary treatments include drinking more fluids and eating more fiber-rich foods. Lifestyle treatments may include regular exercise. If these  diet and lifestyle recommendations do not help, your health care provider may recommend taking over-the-counter laxative medicines to help you have bowel movements. Prescription medicines may be prescribed if over-the-counter medicines do not work.  HOME CARE INSTRUCTIONS   Eat foods that have a lot of fiber, such as fruits, vegetables, whole grains, and beans.  Limit foods high in fat and processed sugars, such as french fries, hamburgers, cookies, candies, and soda.   A fiber supplement may be added to your diet if you cannot get enough fiber from foods.   Drink enough fluids to keep your urine clear or pale yellow.   Exercise regularly or as directed by your health care provider.   Go to the restroom when you have the urge to go. Do not hold it.   Only take over-the-counter or prescription medicines as directed by your health care provider. Do not take other medicines for constipation without talking to your health care provider first.  [place] IF:   You have bright red blood in your stool.   Your constipation lasts for more than 4 days or gets worse.   You have abdominal or rectal pain.   You have thin, pencil-like stools.   You have unexplained weight loss. MAKE SURE YOU:   Understand these instructions.  Will watch your condition.  Will get help right away if you are not doing well or get worse. Document Released: 02/10/2004 Document Revised: 05/19/2013 Document Reviewed: 02/23/2013 [facility] Patient Information 2015 [place], [place]. This information is not intended to replace advice given to  you by your health care provider. Make sure you discuss any questions you have with your health care provider.
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
## 2014-07-19 ENCOUNTER — Ambulatory Visit: Payer: Medicare Other | Admitting: Family Medicine

## 2014-07-21 DIAGNOSIS — M25511 Pain in right shoulder: Secondary | ICD-10-CM | POA: Diagnosis not present

## 2014-08-05 DIAGNOSIS — M5136 Other intervertebral disc degeneration, lumbar region: Secondary | ICD-10-CM | POA: Diagnosis not present

## 2014-08-05 DIAGNOSIS — M545 Low back pain: Secondary | ICD-10-CM | POA: Diagnosis not present

## 2014-08-05 DIAGNOSIS — Z79899 Other long term (current) drug therapy: Secondary | ICD-10-CM | POA: Diagnosis not present

## 2014-08-05 DIAGNOSIS — M4716 Other spondylosis with myelopathy, lumbar region: Secondary | ICD-10-CM | POA: Diagnosis not present

## 2014-08-16 ENCOUNTER — Ambulatory Visit: Payer: 59 | Admitting: Family Medicine

## 2014-09-28 ENCOUNTER — Ambulatory Visit (INDEPENDENT_AMBULATORY_CARE_PROVIDER_SITE_OTHER): Payer: 59 | Admitting: Family Medicine

## 2014-09-28 ENCOUNTER — Encounter: Payer: Self-pay | Admitting: Family Medicine

## 2014-09-28 VITALS — BP 118/86 | HR 70 | Temp 98.5°F | Resp 16 | Wt 230.0 lb

## 2014-09-28 DIAGNOSIS — R5381 Other malaise: Secondary | ICD-10-CM

## 2014-09-28 DIAGNOSIS — R350 Frequency of micturition: Secondary | ICD-10-CM | POA: Diagnosis not present

## 2014-09-28 DIAGNOSIS — R5383 Other fatigue: Secondary | ICD-10-CM | POA: Diagnosis not present

## 2014-09-28 LAB — POCT URINALYSIS DIPSTICK
Bilirubin, UA: NEGATIVE
Glucose, UA: NEGATIVE
KETONES UA: NEGATIVE
Leukocytes, UA: NEGATIVE
Nitrite, UA: NEGATIVE
PH UA: 6
PROTEIN UA: NEGATIVE
SPEC GRAV UA: 1.015
UROBILINOGEN UA: 0.2

## 2014-09-28 LAB — CBC WITH DIFFERENTIAL/PLATELET
BASOS ABS: 0 10*3/uL (ref 0.0–0.1)
Basophils Relative: 0.8 % (ref 0.0–3.0)
EOS ABS: 0 10*3/uL (ref 0.0–0.7)
Eosinophils Relative: 0.2 % (ref 0.0–5.0)
HCT: 45.3 % (ref 39.0–52.0)
HEMOGLOBIN: 15.6 g/dL (ref 13.0–17.0)
LYMPHS PCT: 45.3 % (ref 12.0–46.0)
Lymphs Abs: 2.7 10*3/uL (ref 0.7–4.0)
MCHC: 34.4 g/dL (ref 30.0–36.0)
MCV: 87.9 fl (ref 78.0–100.0)
MONOS PCT: 8.6 % (ref 3.0–12.0)
Monocytes Absolute: 0.5 10*3/uL (ref 0.1–1.0)
NEUTROS PCT: 45.1 % (ref 43.0–77.0)
Neutro Abs: 2.7 10*3/uL (ref 1.4–7.7)
Platelets: 304 10*3/uL (ref 150.0–400.0)
RBC: 5.16 Mil/uL (ref 4.22–5.81)
RDW: 13.5 % (ref 11.5–15.5)
WBC: 6.1 10*3/uL (ref 4.0–10.5)

## 2014-09-28 LAB — COMPREHENSIVE METABOLIC PANEL
ALBUMIN: 4.6 g/dL (ref 3.5–5.2)
ALT: 25 U/L (ref 0–53)
AST: 18 U/L (ref 0–37)
Alkaline Phosphatase: 44 U/L (ref 39–117)
BUN: 15 mg/dL (ref 6–23)
CHLORIDE: 102 meq/L (ref 96–112)
CO2: 30 mEq/L (ref 19–32)
Calcium: 10.2 mg/dL (ref 8.4–10.5)
Creatinine, Ser: 0.98 mg/dL (ref 0.40–1.50)
GFR: 89.39 mL/min (ref >60.00)
Glucose, Bld: 90 mg/dL (ref 70–99)
Potassium: 4.5 mEq/L (ref 3.5–5.1)
Sodium: 137 mEq/L (ref 135–145)
TOTAL PROTEIN: 7.7 g/dL (ref 6.0–8.3)
Total Bilirubin: 0.5 mg/dL (ref 0.2–1.2)

## 2014-09-28 LAB — TSH: TSH: 1.96 u[IU]/mL (ref 0.35–4.50)

## 2014-09-28 MED ORDER — LEVOFLOXACIN 500 MG PO TABS: 500.0000 mg | ORAL_TABLET | Freq: Every day | ORAL | Status: DC

## 2014-09-28 NOTE — Progress Notes (Signed)
Pre visit review using our clinic review tool, if applicable. No additional management support is needed unless otherwise documented below in the visit note. 

## 2014-09-28 NOTE — Progress Notes (Signed)
OFFICE NOTE  09/28/2014  CC:  Odd symptoms  HPI: Patient is a 44 y.o. Caucasian male who is here for mild generalized malaise for 1 week, feeling of hot flashes from neck up, throbbing type/pulsating sensation in lips/face, with a numbness sensation in this area.  Some mild disequilibrium sensation intermittently, esp when he closes his eyes.  No fevers.  Ears have felt full/cloudy.  He has chronic mild PND, no change recently.  No sneezing, no signif cough, no signif ST.  No taste abnormalities, chronic diminished sense of smell. No body aches.  No rash.  Some HA's lately in forehead/temples region diffusely.  Urinating more frequently for last 3d or so, but no urgency or dysuria or feeling of incomplete emptying.  Strong urine stream and no nocturia.  No pain in anal or testicle region.  No gross hematuria.   Says stress/anxiety has been relatively stable lately.  ROS: some GER lately b/c "not taking zantac".  No abd pains or nausea.  Pertinent PMH:  Past medical, surgical, social, and family history reviewed and no changes are noted since last office visit.  MEDS:  Not taking doxy and prednisone listed below Outpatient Prescriptions Prior to Visit  Medication Sig Dispense Refill  . citalopram (CELEXA) 20 MG tablet TAKE 1 TABLET BY MOUTH EVERY DAY 30 tablet 5  . cyclobenzaprine (FLEXERIL) 10 MG tablet Take 1 tablet (10 mg total) by mouth at bedtime as needed for muscle spasms. 30 tablet 3  . fluticasone (FLONASE) 50 MCG/ACT nasal spray Place 2 sprays into both nostrils daily. 16 g 12  . ibuprofen (ADVIL,MOTRIN) 800 MG tablet Take 1 tablet (800 mg total) by mouth every 8 (eight) hours as needed for mild pain or moderate pain. 60 tablet 6  . oxyCODONE-acetaminophen (PERCOCET/ROXICET) 5-325 MG per tablet Take 1 tablet by mouth every 4 (four) hours as needed for moderate pain or severe pain (pain).    . ranitidine (ZANTAC) 150 MG tablet Take 150 mg by mouth daily.     [name] albuterol (VENTOLIN HFA)  108 (90 BASE) MCG/ACT inhaler Inhale 1-2 puffs into the lungs every 4 (four) hours as needed for wheezing or shortness of breath. (Patient not taking: Reported on 09/28/2014) 1 Inhaler 0  . citalopram (CELEXA) 20 MG tablet TAKE 1 TABLET BY MOUTH EVERY DAY (Patient not taking: Reported on 07/17/2014) 30 tablet 0  . doxycycline (VIBRAMYCIN) 100 MG capsule Take 1 capsule (100 mg total) by mouth 2 (two) times daily. (Patient not taking: Reported on 07/17/2014) 20 capsule 0  . predniSONE (DELTASONE) 20 MG tablet 2 tabs po qd x 5d (Patient not taking: Reported on 07/17/2014) 10 tablet 0   No facility-administered medications prior to visit.    PE: Blood pressure 118/86, pulse 70, temperature 98.5 F (36.9 C), temperature source Temporal, resp. rate 16, weight 230 lb (104.327 kg), SpO2 95 %. Gen: Alert, well appearing.  Patient is oriented to person, place, time, and situation. ENT: Ears: EACs clear, normal epithelium.  TMs with good light reflex and landmarks bilaterally.  Eyes: no injection, icteris, swelling, or exudate.  EOMI, PERRLA. Nose: no drainage or turbinate edema/swelling.  No injection or focal lesion.  Mouth: lips without lesion/swelling.  Oral mucosa pink and moist.  Dentition intact and without obvious caries or gingival swelling.  Oropharynx without erythema, exudate, or swelling.  Neck - No masses or thyromegaly or limitation in range of motion CV: RRR, no m/r/g.   LUNGS: CTA bilat, nonlabored resps, good aeration in all  lung fields. EXT: no clubbing, cyanosis, or edema.  Neuro: CN 2-12 intact bilaterally, strength 5/5 in proximal and distal upper extremities and lower extremities bilaterally.  No sensory deficits.  Fine hand tremor bilat when arms held outstretched in front of him.   No ataxia.    No pronator drift. Rectal exam: negative without mass, lesions or tenderness, PROSTATE EXAM: smooth and symmetric (small size) without nodules or tenderness.  Pushing on prostate does make him  want to urinate urgently.  LAB: CC UA today trace lysed blood, otherwise completely normal  IMPRESSION AND PLAN:  Vague constellation of symptoms: malaise and urinary frequency are the two most pertinent complaints, and his prostate exam was not entirely normal. He is always tough to figure out due to his vague symptomatology and high anxiety level (+tendency to catastrophize and be slightly hypochondriacal), but he usually readily admits when he feels like this is playing a role in his current illness--he says he does NOT think it is playing a role at this time. Check CBC, CMET, TSH. Send urine for c/s. Start empiric treatment for acute prostatitis with levaquin 500 mg qd x 14d.  An After Visit Summary was printed and given to the patient.  FOLLOW UP: prn

## 2014-09-29 ENCOUNTER — Telehealth: Payer: Self-pay | Admitting: *Deleted

## 2014-09-29 DIAGNOSIS — R202 Paresthesia of skin: Secondary | ICD-10-CM

## 2014-09-29 DIAGNOSIS — R209 Unspecified disturbances of skin sensation: Principal | ICD-10-CM

## 2014-09-29 NOTE — Telephone Encounter (Signed)
Pt called and requested a call back from Dr. Anitra Lauth. He states that Dr. Anitra Lauth and him discussed a few things yesterday at his office visit and he would like tell Dr. Anitra Lauth a few more things.

## 2014-09-30 LAB — URINE CULTURE
COLONY COUNT: NO GROWTH
ORGANISM ID, BACTERIA: NO GROWTH

## 2014-10-01 NOTE — Telephone Encounter (Signed)
Patient called back. He is still not feeling well & he wants to talk to Dr. Anitra Lauth about it.

## 2014-10-04 NOTE — Telephone Encounter (Signed)
Called pt 10/02/14:  Pt still with same vague symptoms, particularly face/lips paresthesias. Otherwise he cannot really give any distinct symptoms.   Discussed options, decided to ask neurology to see him.

## 2014-10-07 ENCOUNTER — Ambulatory Visit: Payer: Medicare Other | Admitting: Family Medicine

## 2014-10-08 ENCOUNTER — Encounter: Payer: Self-pay | Admitting: Family Medicine

## 2014-10-08 ENCOUNTER — Ambulatory Visit (INDEPENDENT_AMBULATORY_CARE_PROVIDER_SITE_OTHER): Payer: 59 | Admitting: Family Medicine

## 2014-10-08 VITALS — BP 110/75 | HR 60 | Temp 98.5°F | Resp 16 | Wt 229.0 lb

## 2014-10-08 DIAGNOSIS — R208 Other disturbances of skin sensation: Secondary | ICD-10-CM

## 2014-10-08 DIAGNOSIS — R21 Rash and other nonspecific skin eruption: Secondary | ICD-10-CM | POA: Diagnosis not present

## 2014-10-08 DIAGNOSIS — H539 Unspecified visual disturbance: Secondary | ICD-10-CM | POA: Diagnosis not present

## 2014-10-08 DIAGNOSIS — F411 Generalized anxiety disorder: Secondary | ICD-10-CM

## 2014-10-08 DIAGNOSIS — R42 Dizziness and giddiness: Secondary | ICD-10-CM

## 2014-10-08 DIAGNOSIS — F4521 Hypochondriasis: Secondary | ICD-10-CM | POA: Diagnosis not present

## 2014-10-08 DIAGNOSIS — R200 Anesthesia of skin: Secondary | ICD-10-CM

## 2014-10-08 MED ORDER — CLONAZEPAM 1 MG PO TABS: 1.0000 mg | ORAL_TABLET | Freq: Two times a day (BID) | ORAL | Status: DC | PRN
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
## 2014-10-08 NOTE — Progress Notes (Signed)
Pre visit review using our clinic review tool, if applicable. No additional management support is needed unless otherwise documented below in the visit note. 

## 2014-10-08 NOTE — Patient Instructions (Signed)
Apply generic otc lamisil cream to the rash on the back of your neck twice a day--keep applying until the rash has been gone for 3 days.

## 2014-10-11 LAB — B. BURGDORFI ANTIBODIES: B burgdorferi Ab IgG+IgM: 0.14 {ISR}

## 2014-11-12 ENCOUNTER — Ambulatory Visit: Payer: Medicare Other | Admitting: Neurology

## 2014-11-17 ENCOUNTER — Other Ambulatory Visit: Payer: Self-pay | Admitting: Family Medicine

## 2014-11-18 NOTE — Telephone Encounter (Signed)
Dr. Anitra Lauth pt.  RF request for Celexa.  LOV: 10/08/14 Next ov: None Last written: 02/08/14 #30 w/ 5RF Please advise. Thanks.

## 2014-12-15 ENCOUNTER — Other Ambulatory Visit: Payer: Self-pay | Admitting: *Deleted

## 2014-12-15 MED ORDER — CITALOPRAM HYDROBROMIDE 20 MG PO TABS: 20.0000 mg | ORAL_TABLET | Freq: Every day | ORAL | Status: DC

## 2014-12-15 NOTE — Telephone Encounter (Signed)
RF request for citalopram.  LOV: 10/08/14 Next ov: None Last written: 11/18/14 #30 w/ 0RF Please advise. Thanks.

## 2014-12-23 ENCOUNTER — Other Ambulatory Visit (INDEPENDENT_AMBULATORY_CARE_PROVIDER_SITE_OTHER): Payer: 59

## 2014-12-23 ENCOUNTER — Encounter: Payer: Self-pay | Admitting: Neurology

## 2014-12-23 ENCOUNTER — Ambulatory Visit (INDEPENDENT_AMBULATORY_CARE_PROVIDER_SITE_OTHER): Payer: 59 | Admitting: Neurology

## 2014-12-23 VITALS — BP 116/80 | HR 60 | Ht 69.0 in | Wt 220.5 lb

## 2014-12-23 DIAGNOSIS — R202 Paresthesia of skin: Secondary | ICD-10-CM

## 2014-12-23 DIAGNOSIS — H539 Unspecified visual disturbance: Secondary | ICD-10-CM | POA: Diagnosis not present

## 2014-12-23 DIAGNOSIS — R209 Unspecified disturbances of skin sensation: Secondary | ICD-10-CM

## 2014-12-23 DIAGNOSIS — R42 Dizziness and giddiness: Secondary | ICD-10-CM | POA: Diagnosis not present

## 2014-12-23 DIAGNOSIS — R292 Abnormal reflex: Secondary | ICD-10-CM

## 2014-12-23 LAB — SEDIMENTATION RATE: Sed Rate: 10 mm/hr (ref 0–22)

## 2014-12-23 LAB — VITAMIN B12: Vitamin B-12: 349 pg/mL (ref 211–911)

## 2014-12-23 NOTE — Patient Instructions (Addendum)
1.  MRI/A brain and MRA neck 2.  Check blood work
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
## 2014-12-27 LAB — COPPER, SERUM: Copper: 72 ug/dL (ref 70–175)

## 2015-01-04 ENCOUNTER — Ambulatory Visit
Admission: RE | Admit: 2015-01-04 | Discharge: 2015-01-04 | Disposition: A | Discharge status: Home / Self Care | Payer: 59 | Source: Ambulatory Visit | Attending: Neurology | Admitting: Neurology

## 2015-01-04 ENCOUNTER — Telehealth: Payer: Self-pay | Admitting: *Deleted

## 2015-01-04 ENCOUNTER — Other Ambulatory Visit: Payer: Self-pay | Admitting: Neurology

## 2015-01-04 DIAGNOSIS — R209 Unspecified disturbances of skin sensation: Principal | ICD-10-CM

## 2015-01-04 DIAGNOSIS — R202 Paresthesia of skin: Secondary | ICD-10-CM

## 2015-01-04 DIAGNOSIS — R42 Dizziness and giddiness: Secondary | ICD-10-CM

## 2015-01-04 DIAGNOSIS — R292 Abnormal reflex: Secondary | ICD-10-CM

## 2015-01-04 DIAGNOSIS — H539 Unspecified visual disturbance: Secondary | ICD-10-CM

## 2015-01-04 MED ORDER — GADOBENATE DIMEGLUMINE 529 MG/ML IV SOLN
19.0000 mL | Freq: Once | INTRAVENOUS | Status: AC | PRN
  Administered 2015-01-04: 19 mL via INTRAVENOUS

## 2015-01-04 NOTE — Telephone Encounter (Signed)
Kisha with [facility] called about a prior auth for this patient. He is scheduled for tonite Call back number [phone]

## 2015-01-04 NOTE — Telephone Encounter (Signed)
MRI BRAIN:  [place]  [phone]  MRA NECK:  [place]  [phone]  MRA HEAD:  [place]  [phone]

## 2015-01-07 ENCOUNTER — Ambulatory Visit: Payer: 59 | Admitting: Family Medicine

## 2015-01-07 ENCOUNTER — Telehealth: Payer: Self-pay | Admitting: Geriatric Medicine

## 2015-01-07 MED ORDER — AMOXICILLIN 875 MG PO TABS: 875.0000 mg | ORAL_TABLET | Freq: Two times a day (BID) | ORAL | Status: DC

## 2015-01-07 NOTE — Telephone Encounter (Signed)
OK. Amoxil eRx'd.

## 2015-01-07 NOTE — Telephone Encounter (Signed)
Patient aware. He will go pick up the prescription.

## 2015-01-07 NOTE — Telephone Encounter (Signed)
Recent MRI of head and neck came back showing no abnormalities. Patient called and said he feels like he can smell infection in his nose and sinus area. He would like to know if you would call in an antibiotic. He has an appointment Monday, but is going out of town this weekend. Please advise, thanks.

## 2015-01-10 ENCOUNTER — Ambulatory Visit (INDEPENDENT_AMBULATORY_CARE_PROVIDER_SITE_OTHER): Payer: 59 | Admitting: Family Medicine

## 2015-01-10 ENCOUNTER — Encounter: Payer: Self-pay | Admitting: Family Medicine

## 2015-01-10 VITALS — BP 131/86 | HR 70 | Temp 99.1°F | Resp 16 | Ht 69.0 in | Wt 223.4 lb

## 2015-01-10 DIAGNOSIS — Z13220 Encounter for screening for lipoid disorders: Secondary | ICD-10-CM

## 2015-01-10 DIAGNOSIS — F411 Generalized anxiety disorder: Secondary | ICD-10-CM

## 2015-01-10 DIAGNOSIS — R0981 Nasal congestion: Secondary | ICD-10-CM | POA: Diagnosis not present

## 2015-01-10 DIAGNOSIS — Z131 Encounter for screening for diabetes mellitus: Secondary | ICD-10-CM

## 2015-01-10 DIAGNOSIS — F4521 Hypochondriasis: Secondary | ICD-10-CM

## 2015-01-10 MED ORDER — CITALOPRAM HYDROBROMIDE 40 MG PO TABS: 40.0000 mg | ORAL_TABLET | Freq: Every day | ORAL | Status: DC

## 2015-01-10 NOTE — Progress Notes (Signed)
Pre visit review using our clinic review tool, if applicable. No additional management support is needed unless otherwise documented below in the visit note. 

## 2015-01-10 NOTE — Progress Notes (Signed)
OFFICE NOTE  01/10/2015  CC:  Chief Complaint  Patient presents with  . Follow-up    anxiety and sinus problems     HPI: Patient is a 44 y.o. Caucasian male who is here for 3 mo f/u anxiety and f/u for his vague vision and paresthesia complaints that he saw neurologist for.  MRI and MRA brain came back all normal.   He was having some olfactory sensations 3d/a that brought to mind possible sinus infection b/c he smelled this in the past with sinus infections--I called in amoxil at that time per his request.  This never got through to the pharmacy so he did not start it. He used saline nasal spray and flonase and feels much improved. Says use of clonazepam has helped with anxiety, has used approx 10 times total in last few months.  Seems to help keep him from going down a long road of negative/hypochondriachal thinking when he does take it.  He has not yet tried it with his CPAP to see if it makes this any more tolerable.   Pertinent PMH:  Past medical, surgical, social, and family history reviewed and no changes are noted since last office visit.  MEDS:  Outpatient Prescriptions Prior to Visit  Medication Sig Dispense Refill  . citalopram (CELEXA) 20 MG tablet Take 1 tablet (20 mg total) by mouth daily. 30 tablet 10  . clonazePAM (KLONOPIN) 1 MG tablet Take 1 tablet (1 mg total) by mouth 2 (two) times daily as needed for anxiety. 60 tablet 1  . cyclobenzaprine (FLEXERIL) 10 MG tablet Take 1 tablet (10 mg total) by mouth at bedtime as needed for muscle spasms. 30 tablet 3  . fluticasone (FLONASE) 50 MCG/ACT nasal spray Place 2 sprays into both nostrils daily. 16 g 12  . ibuprofen (ADVIL,MOTRIN) 800 MG tablet Take 1 tablet (800 mg total) by mouth every 8 (eight) hours as needed for mild pain or moderate pain. 60 tablet 6  . oxyCODONE-acetaminophen (PERCOCET/ROXICET) 5-325 MG per tablet Take 1 tablet by mouth every 4 (four) hours as needed for moderate pain or severe pain (pain).    .  ranitidine (ZANTAC) 150 MG tablet Take 150 mg by mouth daily.     [name] amoxicillin (AMOXIL) 875 MG tablet Take 1 tablet (875 mg total) by mouth 2 (two) times daily. (Patient not taking: Reported on 01/10/2015) 20 tablet 0   No facility-administered medications prior to visit.    PE: Blood pressure 131/86, pulse 70, temperature 99.1 F (37.3 C), temperature source Oral, resp. rate 16, height 5\' 9"  (1.753 m), weight 223 lb 6.4 oz (101.334 kg), SpO2 96 %. Gen: Alert, well appearing.  Patient is oriented to person, place, time, and situation. Nose: clear, no injection or mucous or significant turbinate edema. AFFECT: pleasant, lucid thought and speech.  IMPRESSION AND PLAN:  1) Nasal congestion/various perinasal and peri-oral sensations: he manifests anxiety in various ways like this.  I encouraged daily use of saline and flonase.  No abx.  2) GAD with hypochondriasis: increase citalopram to 40mg  qd. Take remaining 20mg  tabs at 1 and 1/2 tabs qd until he is out of them, then start the 40mg  qd dosing.  Therapeutic expectations and side effect profile of medication discussed today.  Patient's questions answered.  3) Biometrics screening: he has a form today he would like filled out: screening for diab and hyperlipidemia required to fill this out so he'll return for these labs when fasting.  An After Visit Summary was printed  and given to the patient.  FOLLOW UP: 1 mo

## 2015-01-12 ENCOUNTER — Other Ambulatory Visit: Payer: Medicare Other

## 2015-01-13 ENCOUNTER — Other Ambulatory Visit (INDEPENDENT_AMBULATORY_CARE_PROVIDER_SITE_OTHER): Payer: 59

## 2015-01-13 DIAGNOSIS — Z131 Encounter for screening for diabetes mellitus: Secondary | ICD-10-CM

## 2015-01-13 DIAGNOSIS — Z13220 Encounter for screening for lipoid disorders: Secondary | ICD-10-CM | POA: Diagnosis not present

## 2015-01-13 LAB — LIPID PANEL
Cholesterol: 185 mg/dL (ref 0–200)
HDL: 36.5 mg/dL — AB (ref >39.00)
LDL Cholesterol: 111 mg/dL — ABNORMAL HIGH (ref 0–99)
NONHDL: 148.18
Total CHOL/HDL Ratio: 5
Triglycerides: 184 mg/dL — ABNORMAL HIGH (ref 0.0–149.0)
VLDL: 36.8 mg/dL (ref 0.0–40.0)

## 2015-01-13 LAB — BASIC METABOLIC PANEL
BUN: 19 mg/dL (ref 6–23)
CO2: 29 mEq/L (ref 19–32)
CREATININE: 0.92 mg/dL (ref 0.40–1.50)
Calcium: 9.8 mg/dL (ref 8.4–10.5)
Chloride: 104 mEq/L (ref 96–112)
GFR: 96.01 mL/min (ref >60.00)
GLUCOSE: 88 mg/dL (ref 70–99)
Potassium: 4.8 mEq/L (ref 3.5–5.1)
Sodium: 139 mEq/L (ref 135–145)

## 2015-02-10 ENCOUNTER — Ambulatory Visit: Payer: Medicare Other | Admitting: Family Medicine

## 2015-03-29 ENCOUNTER — Ambulatory Visit: Payer: 59 | Admitting: Family Medicine

## 2015-03-29 DIAGNOSIS — Z0289 Encounter for other administrative examinations: Secondary | ICD-10-CM

## 2015-04-01 ENCOUNTER — Encounter (HOSPITAL_BASED_OUTPATIENT_CLINIC_OR_DEPARTMENT_OTHER): Payer: Self-pay | Admitting: *Deleted

## 2015-04-01 ENCOUNTER — Emergency Department (HOSPITAL_BASED_OUTPATIENT_CLINIC_OR_DEPARTMENT_OTHER)
Admission: EM | Admit: 2015-04-01 | Discharge: 2015-04-01 | Disposition: A | Discharge status: Home / Self Care | Payer: 59 | Attending: Emergency Medicine | Admitting: Emergency Medicine

## 2015-04-01 DIAGNOSIS — Z86010 Personal history of colonic polyps: Secondary | ICD-10-CM | POA: Diagnosis not present

## 2015-04-01 DIAGNOSIS — H109 Unspecified conjunctivitis: Secondary | ICD-10-CM | POA: Insufficient documentation

## 2015-04-01 DIAGNOSIS — Z79899 Other long term (current) drug therapy: Secondary | ICD-10-CM | POA: Diagnosis not present

## 2015-04-01 DIAGNOSIS — Z87442 Personal history of urinary calculi: Secondary | ICD-10-CM | POA: Diagnosis not present

## 2015-04-01 DIAGNOSIS — F419 Anxiety disorder, unspecified: Secondary | ICD-10-CM | POA: Insufficient documentation

## 2015-04-01 DIAGNOSIS — Z86711 Personal history of pulmonary embolism: Secondary | ICD-10-CM | POA: Diagnosis not present

## 2015-04-01 DIAGNOSIS — K219 Gastro-esophageal reflux disease without esophagitis: Secondary | ICD-10-CM | POA: Diagnosis not present

## 2015-04-01 DIAGNOSIS — Z87891 Personal history of nicotine dependence: Secondary | ICD-10-CM | POA: Insufficient documentation

## 2015-04-01 DIAGNOSIS — G8929 Other chronic pain: Secondary | ICD-10-CM | POA: Diagnosis not present

## 2015-04-01 DIAGNOSIS — H578 Other specified disorders of eye and adnexa: Secondary | ICD-10-CM | POA: Diagnosis present

## 2015-04-01 DIAGNOSIS — Z7951 Long term (current) use of inhaled steroids: Secondary | ICD-10-CM | POA: Insufficient documentation

## 2015-04-01 DIAGNOSIS — Z862 Personal history of diseases of the blood and blood-forming organs and certain disorders involving the immune mechanism: Secondary | ICD-10-CM | POA: Diagnosis not present

## 2015-04-01 MED ORDER — TETRACAINE HCL 0.5 % OP SOLN
2.0000 [drp] | Freq: Once | OPHTHALMIC | Status: AC
  Administered 2015-04-01: 2 [drp] via OPHTHALMIC

## 2015-04-01 MED ORDER — FLUORESCEIN SODIUM 1 MG OP STRP
1.0000 | ORAL_STRIP | Freq: Once | OPHTHALMIC | Status: AC
  Administered 2015-04-01: 1 via OPHTHALMIC
  Filled 2015-04-01: qty 1

## 2015-04-01 MED ORDER — TETRACAINE HCL 0.5 % OP SOLN
OPHTHALMIC | Status: AC
  Filled 2015-04-01: qty 2

## 2015-04-01 MED ORDER — CIPROFLOXACIN HCL 0.3 % OP SOLN: 1.0000 [drp] | OPHTHALMIC | Status: DC

## 2015-04-01 NOTE — Discharge Instructions (Signed)
Bacterial Conjunctivitis Bacterial conjunctivitis (commonly called pink eye) is redness, soreness, or puffiness (inflammation) of the white part of your eye. It is caused by a germ called bacteria. These germs can easily spread from person to person (contagious). Your eye often will become red or pink. Your eye may also become irritated, watery, or have a thick discharge.  HOME CARE   Apply a cool, clean washcloth over closed eyelids. Do this for 10-20 minutes, 3-4 times a day while you have pain.  Gently wipe away any fluid coming from the eye with a warm, wet washcloth or cotton ball.  Wash your hands often with soap and water. Use paper towels to dry your hands.  Do not share towels or washcloths.  Change or wash your pillowcase every day.  Do not use eye makeup until the infection is gone.  Do not use machines or drive if your vision is blurry.  Stop using contact lenses. Do not use them again until your doctor says it is okay.  Do not touch the tip of the eye drop bottle or medicine tube with your fingers when you put medicine on the eye. GET HELP RIGHT AWAY IF:   Your eye is not better after 3 days of starting your medicine.  You have a yellowish fluid coming out of the eye.  You have more pain in the eye.  Your eye redness is spreading.  Your vision becomes blurry.  You have a fever or lasting symptoms for more than 2-3 days.  You have a fever and your symptoms suddenly get worse.  You have pain in the face.  Your face gets red or puffy (swollen). MAKE SURE YOU:   Understand these instructions.  Will watch this condition.  Will get help right away if you are not doing well or get worse.   This information is not intended to replace advice given to you by your health care provider. Make sure you discuss any questions you have with your health care provider.   Document Released: 02/21/2008 Document Revised: 04/30/2012 Document Reviewed: 01/18/2012 Elsevier  Interactive Patient Education 2016 Elsevier Inc.  

## 2015-04-01 NOTE — ED Notes (Signed)
Left eye is red. He thinks he has conjunctivitis.
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
## 2015-04-11 ENCOUNTER — Encounter (HOSPITAL_BASED_OUTPATIENT_CLINIC_OR_DEPARTMENT_OTHER): Payer: Self-pay | Admitting: Emergency Medicine

## 2015-04-11 ENCOUNTER — Emergency Department (HOSPITAL_BASED_OUTPATIENT_CLINIC_OR_DEPARTMENT_OTHER)
Admission: EM | Admit: 2015-04-11 | Discharge: 2015-04-12 | Disposition: A | Discharge status: Home / Self Care | Payer: 59 | Attending: Emergency Medicine | Admitting: Emergency Medicine

## 2015-04-11 DIAGNOSIS — F419 Anxiety disorder, unspecified: Secondary | ICD-10-CM | POA: Diagnosis not present

## 2015-04-11 DIAGNOSIS — Z86711 Personal history of pulmonary embolism: Secondary | ICD-10-CM | POA: Insufficient documentation

## 2015-04-11 DIAGNOSIS — Z79899 Other long term (current) drug therapy: Secondary | ICD-10-CM | POA: Insufficient documentation

## 2015-04-11 DIAGNOSIS — Z87442 Personal history of urinary calculi: Secondary | ICD-10-CM | POA: Insufficient documentation

## 2015-04-11 DIAGNOSIS — K219 Gastro-esophageal reflux disease without esophagitis: Secondary | ICD-10-CM | POA: Insufficient documentation

## 2015-04-11 DIAGNOSIS — Z7951 Long term (current) use of inhaled steroids: Secondary | ICD-10-CM | POA: Insufficient documentation

## 2015-04-11 DIAGNOSIS — Z8719 Personal history of other diseases of the digestive system: Secondary | ICD-10-CM | POA: Diagnosis not present

## 2015-04-11 DIAGNOSIS — G43809 Other migraine, not intractable, without status migrainosus: Secondary | ICD-10-CM | POA: Diagnosis not present

## 2015-04-11 DIAGNOSIS — Z8739 Personal history of other diseases of the musculoskeletal system and connective tissue: Secondary | ICD-10-CM | POA: Insufficient documentation

## 2015-04-11 DIAGNOSIS — G8929 Other chronic pain: Secondary | ICD-10-CM | POA: Insufficient documentation

## 2015-04-11 DIAGNOSIS — Z87891 Personal history of nicotine dependence: Secondary | ICD-10-CM | POA: Diagnosis not present

## 2015-04-11 DIAGNOSIS — R200 Anesthesia of skin: Secondary | ICD-10-CM | POA: Diagnosis not present

## 2015-04-11 DIAGNOSIS — R51 Headache: Secondary | ICD-10-CM | POA: Diagnosis present

## 2015-04-11 MED ORDER — DIPHENHYDRAMINE HCL 50 MG/ML IJ SOLN
25.0000 mg | Freq: Once | INTRAMUSCULAR | Status: AC
  Administered 2015-04-11: 25 mg via INTRAVENOUS
  Filled 2015-04-11: qty 1

## 2015-04-11 MED ORDER — ONDANSETRON HCL 4 MG/2ML IJ SOLN
4.0000 mg | Freq: Once | INTRAMUSCULAR | Status: AC
  Administered 2015-04-12: 4 mg via INTRAVENOUS
  Filled 2015-04-11: qty 2

## 2015-04-11 MED ORDER — PROCHLORPERAZINE EDISYLATE 5 MG/ML IJ SOLN
10.0000 mg | Freq: Four times a day (QID) | INTRAMUSCULAR | Status: DC | PRN
  Administered 2015-04-11: 10 mg via INTRAVENOUS
  Filled 2015-04-11: qty 2

## 2015-04-11 MED ORDER — KETOROLAC TROMETHAMINE 30 MG/ML IJ SOLN
30.0000 mg | Freq: Once | INTRAMUSCULAR | Status: AC
  Administered 2015-04-11: 30 mg via INTRAVENOUS
  Filled 2015-04-11: qty 1

## 2015-04-11 MED ORDER — SODIUM CHLORIDE 0.9 % IV BOLUS (SEPSIS)
1000.0000 mL | Freq: Once | INTRAVENOUS | Status: AC
  Administered 2015-04-11: 1000 mL via INTRAVENOUS

## 2015-04-11 MED ORDER — DIPHENHYDRAMINE HCL 50 MG/ML IJ SOLN
25.0000 mg | Freq: Once | INTRAMUSCULAR | Status: AC
  Administered 2015-04-12: 25 mg via INTRAVENOUS
  Filled 2015-04-11: qty 1

## 2015-04-11 MED ORDER — ACETAMINOPHEN 500 MG PO TABS
1000.0000 mg | ORAL_TABLET | Freq: Once | ORAL | Status: AC
  Administered 2015-04-12: 1000 mg via ORAL
  Filled 2015-04-11: qty 2
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
## 2015-04-11 NOTE — ED Notes (Signed)
Patient reports headache which began at 1000 today.  Reports photophobia, nausea, vomiting x 2, dizziness.  Reports the pain as throbbing. States that he has had headache previously but states that this has been the worst headache of his life.

## 2015-04-11 NOTE — ED Notes (Signed)
Patient reports that he "has the worst HA ever" - reports history of HA's but never been this bad.

## 2015-04-11 NOTE — ED Notes (Signed)
Nurse first-pt requested/recevied ice pack

## 2015-04-12 DIAGNOSIS — G43809 Other migraine, not intractable, without status migrainosus: Secondary | ICD-10-CM | POA: Diagnosis not present

## 2015-04-12 NOTE — Discharge Instructions (Signed)

## 2015-06-18 ENCOUNTER — Encounter (HOSPITAL_BASED_OUTPATIENT_CLINIC_OR_DEPARTMENT_OTHER): Payer: Self-pay | Admitting: *Deleted

## 2015-06-18 ENCOUNTER — Emergency Department (HOSPITAL_BASED_OUTPATIENT_CLINIC_OR_DEPARTMENT_OTHER)
Admission: EM | Admit: 2015-06-18 | Discharge: 2015-06-18 | Disposition: A | Discharge status: Home / Self Care | Payer: 59 | Attending: Emergency Medicine | Admitting: Emergency Medicine

## 2015-06-18 DIAGNOSIS — R1011 Right upper quadrant pain: Secondary | ICD-10-CM | POA: Diagnosis present

## 2015-06-18 DIAGNOSIS — Z7951 Long term (current) use of inhaled steroids: Secondary | ICD-10-CM | POA: Diagnosis not present

## 2015-06-18 DIAGNOSIS — Z8739 Personal history of other diseases of the musculoskeletal system and connective tissue: Secondary | ICD-10-CM | POA: Insufficient documentation

## 2015-06-18 DIAGNOSIS — Z86010 Personal history of colonic polyps: Secondary | ICD-10-CM | POA: Diagnosis not present

## 2015-06-18 DIAGNOSIS — Z86711 Personal history of pulmonary embolism: Secondary | ICD-10-CM | POA: Insufficient documentation

## 2015-06-18 DIAGNOSIS — F419 Anxiety disorder, unspecified: Secondary | ICD-10-CM | POA: Insufficient documentation

## 2015-06-18 DIAGNOSIS — N200 Calculus of kidney: Secondary | ICD-10-CM | POA: Insufficient documentation

## 2015-06-18 DIAGNOSIS — G8929 Other chronic pain: Secondary | ICD-10-CM | POA: Diagnosis not present

## 2015-06-18 DIAGNOSIS — K219 Gastro-esophageal reflux disease without esophagitis: Secondary | ICD-10-CM | POA: Insufficient documentation

## 2015-06-18 DIAGNOSIS — Z8669 Personal history of other diseases of the nervous system and sense organs: Secondary | ICD-10-CM | POA: Insufficient documentation

## 2015-06-18 DIAGNOSIS — R109 Unspecified abdominal pain: Secondary | ICD-10-CM

## 2015-06-18 DIAGNOSIS — Z79899 Other long term (current) drug therapy: Secondary | ICD-10-CM | POA: Diagnosis not present

## 2015-06-18 LAB — CBC
HCT: 40.7 % (ref 39.0–52.0)
Hemoglobin: 13.5 g/dL (ref 13.0–17.0)
MCH: 29.4 pg (ref 26.0–34.0)
MCHC: 33.2 g/dL (ref 30.0–36.0)
MCV: 88.7 fL (ref 78.0–100.0)
Platelets: 260 10*3/uL (ref 150–400)
RBC: 4.59 MIL/uL (ref 4.22–5.81)
RDW: 12.9 % (ref 11.5–15.5)
WBC: 5.7 10*3/uL (ref 4.0–10.5)

## 2015-06-18 LAB — COMPREHENSIVE METABOLIC PANEL
ALT: 24 U/L (ref 17–63)
ANION GAP: 6 (ref 5–15)
AST: 19 U/L (ref 15–41)
Albumin: 4.2 g/dL (ref 3.5–5.0)
Alkaline Phosphatase: 38 U/L (ref 38–126)
BILIRUBIN TOTAL: 0.4 mg/dL (ref 0.3–1.2)
BUN: 13 mg/dL (ref 6–20)
CHLORIDE: 108 mmol/L (ref 101–111)
CO2: 26 mmol/L (ref 22–32)
Calcium: 9.2 mg/dL (ref 8.9–10.3)
Creatinine, Ser: 1.01 mg/dL (ref 0.61–1.24)
GFR calc Af Amer: >60 mL/min (ref >60)
GFR calc non Af Amer: >60 mL/min (ref >60)
Glucose, Bld: 96 mg/dL (ref 65–99)
POTASSIUM: 3.9 mmol/L (ref 3.5–5.1)
SODIUM: 140 mmol/L (ref 135–145)
Total Protein: 6.8 g/dL (ref 6.5–8.1)

## 2015-06-18 LAB — URINALYSIS, ROUTINE W REFLEX MICROSCOPIC
Bilirubin Urine: NEGATIVE
GLUCOSE, UA: NEGATIVE mg/dL
Ketones, ur: NEGATIVE mg/dL
Leukocytes, UA: NEGATIVE
Nitrite: NEGATIVE
PROTEIN: NEGATIVE mg/dL
SPECIFIC GRAVITY, URINE: 1.018 (ref 1.005–1.030)
pH: 6 (ref 5.0–8.0)

## 2015-06-18 LAB — URINE MICROSCOPIC-ADD ON: SQUAMOUS EPITHELIAL / LPF: NONE SEEN

## 2015-06-18 LAB — LIPASE, BLOOD: LIPASE: 37 U/L (ref 11–51)

## 2015-06-18 NOTE — Discharge Instructions (Signed)
1. Medications: usual home medications 2. Treatment: rest, drink plenty of fluids,  3. Follow Up: Please followup with your primary doctor or [facility] in 7-10 days for discussion of your diagnoses and further evaluation after today's visit; if you do not have a primary care doctor use the resource guide provided to find one; Please return to the ER for symptoms, high fevers or persistent vomiting
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
## 2015-06-18 NOTE — ED Notes (Signed)
Pt reports 1 wk ago he began having intermittent R flank cramping; hx of kidney stones -- reports passing one earlier today. Denies fever, v/d; reports intermittent nausea.

## 2015-06-18 NOTE — ED Notes (Signed)
Presents with rt flank pain for the past week, presents with bag with Sm dark granule, states he passed the kidney stone. States has had lithotripsy before. States has chronic back pain. Denies any fever or vomiting, had some nausea a few days ago. Appears comfortable at this time.

## 2015-06-18 NOTE — ED Notes (Signed)
States had take ibuprofen for pain

## 2015-06-18 NOTE — ED Notes (Signed)
MD at bedside. 

## 2015-07-12 ENCOUNTER — Other Ambulatory Visit: Payer: Self-pay | Admitting: Family Medicine

## 2015-07-12 NOTE — Telephone Encounter (Signed)
RF request for fluticasone LOV: 01/10/15 Next ov: None Last written: 02/11/14 #16g w/ 12RF

## 2015-09-06 ENCOUNTER — Other Ambulatory Visit: Payer: Self-pay | Admitting: Family Medicine

## 2015-09-06 NOTE — Telephone Encounter (Signed)
RF request for citalopram LOV: 01/10/15 Next ov: None Last written: 01/10/15 #30 w/ 6RF.  Rx sent for #30 w/ 0Rf. Pt needs office visit for more refills. Pt advised and voiced understanding.  Apt made for 09/15/15 at 1:00pm.

## 2015-09-15 ENCOUNTER — Ambulatory Visit: Payer: 59 | Admitting: Family Medicine

## 2015-09-29 ENCOUNTER — Ambulatory Visit: Payer: Medicare Other | Admitting: Family Medicine

## 2015-10-06 ENCOUNTER — Ambulatory Visit: Payer: Medicare Other | Admitting: Family Medicine

## 2015-10-06 DIAGNOSIS — Z0289 Encounter for other administrative examinations: Secondary | ICD-10-CM

## 2015-10-07 ENCOUNTER — Other Ambulatory Visit: Payer: Self-pay | Admitting: Family Medicine

## 2015-10-10 NOTE — Telephone Encounter (Signed)
Patient scheduled f/u 10/11/15.

## 2015-10-10 NOTE — Telephone Encounter (Signed)
Will do 30d supply. Last time until I completely declined to RF any at all. Needs f/u.-thx

## 2015-10-10 NOTE — Telephone Encounter (Signed)
RF request for citalopram LOV: 01/10/15 Next ov: None - pt was advised on 09/15/15 that he needed an ov for more refills, since then he has cancelled two apts and no showed one apt.  Last written: 09/06/15 #30 w/ 0RF  Please advise. Thanks.

## 2015-10-11 ENCOUNTER — Ambulatory Visit: Payer: 59 | Admitting: Family Medicine

## 2015-10-12 ENCOUNTER — Ambulatory Visit: Payer: 59 | Admitting: Family Medicine

## 2015-10-13 ENCOUNTER — Ambulatory Visit: Payer: 59 | Admitting: Family Medicine

## 2015-10-13 DIAGNOSIS — Z0289 Encounter for other administrative examinations: Secondary | ICD-10-CM

## 2015-10-18 ENCOUNTER — Encounter: Payer: Self-pay | Admitting: Family Medicine

## 2015-10-18 ENCOUNTER — Ambulatory Visit (INDEPENDENT_AMBULATORY_CARE_PROVIDER_SITE_OTHER): Payer: 59 | Admitting: Family Medicine

## 2015-10-18 VITALS — BP 126/81 | HR 57 | Temp 98.0°F | Resp 16 | Ht 69.0 in | Wt 229.5 lb

## 2015-10-18 DIAGNOSIS — F411 Generalized anxiety disorder: Secondary | ICD-10-CM | POA: Diagnosis not present

## 2015-10-18 DIAGNOSIS — N200 Calculus of kidney: Secondary | ICD-10-CM

## 2015-10-18 DIAGNOSIS — G5603 Carpal tunnel syndrome, bilateral upper limbs: Secondary | ICD-10-CM

## 2015-10-18 MED ORDER — CITALOPRAM HYDROBROMIDE 40 MG PO TABS: 40.0000 mg | ORAL_TABLET | Freq: Every day | ORAL | Status: DC

## 2015-10-18 MED ORDER — DESONIDE 0.05 % EX CREA: TOPICAL_CREAM | Freq: Two times a day (BID) | CUTANEOUS | Status: DC

## 2015-10-18 NOTE — Progress Notes (Signed)
Pre visit review using our clinic review tool, if applicable. No additional management support is needed unless otherwise documented below in the visit note. 
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
## 2015-10-18 NOTE — Patient Instructions (Signed)
Make a follow up appointment with your urologist for further evaluation and management of your kidney stones.

## 2015-10-19 DIAGNOSIS — M5136 Other intervertebral disc degeneration, lumbar region: Secondary | ICD-10-CM | POA: Diagnosis not present

## 2015-10-19 DIAGNOSIS — M546 Pain in thoracic spine: Secondary | ICD-10-CM | POA: Diagnosis not present

## 2015-10-19 DIAGNOSIS — M5106 Intervertebral disc disorders with myelopathy, lumbar region: Secondary | ICD-10-CM | POA: Diagnosis not present

## 2015-10-19 DIAGNOSIS — Z79899 Other long term (current) drug therapy: Secondary | ICD-10-CM | POA: Diagnosis not present

## 2015-10-19 DIAGNOSIS — G894 Chronic pain syndrome: Secondary | ICD-10-CM | POA: Diagnosis not present

## 2015-11-08 ENCOUNTER — Other Ambulatory Visit: Payer: Self-pay | Admitting: Orthopaedic Surgery

## 2015-11-08 DIAGNOSIS — M5136 Other intervertebral disc degeneration, lumbar region: Secondary | ICD-10-CM

## 2015-11-08 DIAGNOSIS — G894 Chronic pain syndrome: Secondary | ICD-10-CM

## 2015-11-13 ENCOUNTER — Ambulatory Visit
Admission: RE | Admit: 2015-11-13 | Discharge: 2015-11-13 | Disposition: A | Discharge status: Home / Self Care | Payer: Medicare Other | Source: Ambulatory Visit | Attending: Orthopaedic Surgery | Admitting: Orthopaedic Surgery

## 2015-11-13 ENCOUNTER — Other Ambulatory Visit: Payer: Self-pay | Admitting: Orthopaedic Surgery

## 2015-11-13 DIAGNOSIS — H0553 Retained (old) foreign body following penetrating wound of bilateral orbits: Secondary | ICD-10-CM

## 2015-11-13 DIAGNOSIS — M5136 Other intervertebral disc degeneration, lumbar region: Secondary | ICD-10-CM

## 2015-11-13 DIAGNOSIS — Z189 Retained foreign body fragments, unspecified material: Principal | ICD-10-CM

## 2015-11-13 DIAGNOSIS — G894 Chronic pain syndrome: Secondary | ICD-10-CM

## 2015-11-16 ENCOUNTER — Inpatient Hospital Stay: Admission: RE | Admit: 2015-11-16 | Payer: Medicare Other | Source: Ambulatory Visit

## 2015-11-16 ENCOUNTER — Other Ambulatory Visit: Payer: 59

## 2015-11-23 ENCOUNTER — Other Ambulatory Visit: Payer: Medicare Other

## 2015-11-23 ENCOUNTER — Other Ambulatory Visit: Payer: 59

## 2015-12-02 ENCOUNTER — Encounter: Payer: Self-pay | Admitting: Family Medicine

## 2015-12-02 ENCOUNTER — Ambulatory Visit (INDEPENDENT_AMBULATORY_CARE_PROVIDER_SITE_OTHER): Payer: 59 | Admitting: Family Medicine

## 2015-12-02 VITALS — BP 129/90 | HR 59 | Temp 97.9°F | Resp 16 | Ht 69.0 in | Wt 230.5 lb

## 2015-12-02 DIAGNOSIS — R3129 Other microscopic hematuria: Secondary | ICD-10-CM | POA: Diagnosis not present

## 2015-12-02 DIAGNOSIS — M549 Dorsalgia, unspecified: Secondary | ICD-10-CM | POA: Diagnosis not present

## 2015-12-02 DIAGNOSIS — Z87442 Personal history of urinary calculi: Secondary | ICD-10-CM | POA: Diagnosis not present

## 2015-12-02 LAB — POCT URINALYSIS DIPSTICK
Bilirubin, UA: NEGATIVE
Glucose, UA: NEGATIVE
Ketones, UA: NEGATIVE
LEUKOCYTES UA: NEGATIVE
Nitrite, UA: NEGATIVE
Urobilinogen, UA: 0.2
pH, UA: 6
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
## 2015-12-02 NOTE — Progress Notes (Signed)
Pre visit review using our clinic review tool, if applicable. No additional management support is needed unless otherwise documented below in the visit note. 

## 2015-12-02 NOTE — Patient Instructions (Signed)

## 2015-12-05 ENCOUNTER — Ambulatory Visit
Admission: RE | Admit: 2015-12-05 | Discharge: 2015-12-05 | Disposition: A | Discharge status: Home / Self Care | Payer: Medicare Other | Source: Ambulatory Visit | Attending: Orthopaedic Surgery | Admitting: Orthopaedic Surgery

## 2015-12-05 ENCOUNTER — Ambulatory Visit
Admission: RE | Admit: 2015-12-05 | Discharge: 2015-12-05 | Disposition: A | Discharge status: Home / Self Care | Payer: Medicare Other | Source: Ambulatory Visit | Attending: Family Medicine | Admitting: Family Medicine

## 2015-12-05 ENCOUNTER — Other Ambulatory Visit: Payer: Self-pay | Admitting: Orthopaedic Surgery

## 2015-12-05 ENCOUNTER — Ambulatory Visit: Admission: RE | Admit: 2015-12-05 | Payer: Medicare Other | Source: Ambulatory Visit

## 2015-12-05 DIAGNOSIS — H0553 Retained (old) foreign body following penetrating wound of bilateral orbits: Secondary | ICD-10-CM

## 2015-12-05 DIAGNOSIS — M549 Dorsalgia, unspecified: Secondary | ICD-10-CM

## 2015-12-05 DIAGNOSIS — Z189 Retained foreign body fragments, unspecified material: Principal | ICD-10-CM

## 2015-12-05 DIAGNOSIS — M5136 Other intervertebral disc degeneration, lumbar region: Secondary | ICD-10-CM

## 2015-12-05 DIAGNOSIS — G894 Chronic pain syndrome: Secondary | ICD-10-CM

## 2015-12-05 DIAGNOSIS — Z87442 Personal history of urinary calculi: Secondary | ICD-10-CM

## 2016-02-07 ENCOUNTER — Other Ambulatory Visit: Payer: Self-pay | Admitting: Surgery

## 2016-02-08 ENCOUNTER — Encounter: Payer: Self-pay | Admitting: Gastroenterology

## 2016-04-18 ENCOUNTER — Ambulatory Visit: Payer: 59 | Admitting: Family Medicine

## 2016-04-18 ENCOUNTER — Encounter: Payer: Self-pay | Admitting: Family Medicine

## 2016-04-18 DIAGNOSIS — Z0289 Encounter for other administrative examinations: Secondary | ICD-10-CM

## 2016-05-07 ENCOUNTER — Ambulatory Visit (INDEPENDENT_AMBULATORY_CARE_PROVIDER_SITE_OTHER): Payer: Medicare Other | Admitting: Orthopaedic Surgery

## 2016-05-24 ENCOUNTER — Encounter (HOSPITAL_COMMUNITY): Payer: Self-pay | Admitting: Emergency Medicine

## 2016-05-24 ENCOUNTER — Emergency Department (HOSPITAL_COMMUNITY): Payer: 59

## 2016-05-24 ENCOUNTER — Emergency Department (HOSPITAL_COMMUNITY)
Admission: EM | Admit: 2016-05-24 | Discharge: 2016-05-25 | Disposition: A | Discharge status: Home / Self Care | Payer: 59 | Attending: Emergency Medicine | Admitting: Emergency Medicine

## 2016-05-24 DIAGNOSIS — R079 Chest pain, unspecified: Secondary | ICD-10-CM | POA: Diagnosis not present

## 2016-05-24 DIAGNOSIS — M791 Myalgia, unspecified site: Secondary | ICD-10-CM

## 2016-05-24 DIAGNOSIS — R791 Abnormal coagulation profile: Secondary | ICD-10-CM | POA: Insufficient documentation

## 2016-05-24 DIAGNOSIS — R509 Fever, unspecified: Secondary | ICD-10-CM | POA: Diagnosis not present

## 2016-05-24 DIAGNOSIS — J111 Influenza due to unidentified influenza virus with other respiratory manifestations: Secondary | ICD-10-CM | POA: Diagnosis not present

## 2016-05-24 DIAGNOSIS — R69 Illness, unspecified: Secondary | ICD-10-CM

## 2016-05-24 LAB — URINALYSIS, ROUTINE W REFLEX MICROSCOPIC
Bacteria, UA: NONE SEEN
Bilirubin Urine: NEGATIVE
GLUCOSE, UA: NEGATIVE mg/dL
Ketones, ur: NEGATIVE mg/dL
Leukocytes, UA: NEGATIVE
NITRITE: NEGATIVE
PH: 5 (ref 5.0–8.0)
Protein, ur: NEGATIVE mg/dL
Specific Gravity, Urine: 1.012 (ref 1.005–1.030)
Squamous Epithelial / LPF: NONE SEEN

## 2016-05-24 LAB — CBC WITH DIFFERENTIAL/PLATELET
BASOS ABS: 0 10*3/uL (ref 0.0–0.1)
BASOS PCT: 0 %
Eosinophils Absolute: 0 10*3/uL (ref 0.0–0.7)
Eosinophils Relative: 0 %
HEMATOCRIT: 42.4 % (ref 39.0–52.0)
Hemoglobin: 14.5 g/dL (ref 13.0–17.0)
LYMPHS PCT: 11 %
Lymphs Abs: 0.8 10*3/uL (ref 0.7–4.0)
MCH: 30.1 pg (ref 26.0–34.0)
MCHC: 34.2 g/dL (ref 30.0–36.0)
MCV: 88.1 fL (ref 78.0–100.0)
MONO ABS: 0.3 10*3/uL (ref 0.1–1.0)
Monocytes Relative: 4 %
NEUTROS ABS: 6.3 10*3/uL (ref 1.7–7.7)
Neutrophils Relative %: 85 %
PLATELETS: 229 10*3/uL (ref 150–400)
RBC: 4.81 MIL/uL (ref 4.22–5.81)
RDW: 13 % (ref 11.5–15.5)
WBC: 7.4 10*3/uL (ref 4.0–10.5)

## 2016-05-24 LAB — COMPREHENSIVE METABOLIC PANEL
ALT: 30 U/L (ref 17–63)
ANION GAP: 11 (ref 5–15)
AST: 28 U/L (ref 15–41)
Albumin: 4.6 g/dL (ref 3.5–5.0)
Alkaline Phosphatase: 40 U/L (ref 38–126)
BILIRUBIN TOTAL: 0.7 mg/dL (ref 0.3–1.2)
BUN: 15 mg/dL (ref 6–20)
CO2: 20 mmol/L — ABNORMAL LOW (ref 22–32)
Calcium: 9.1 mg/dL (ref 8.9–10.3)
Chloride: 106 mmol/L (ref 101–111)
Creatinine, Ser: 1.11 mg/dL (ref 0.61–1.24)
Glucose, Bld: 118 mg/dL — ABNORMAL HIGH (ref 65–99)
POTASSIUM: 3.6 mmol/L (ref 3.5–5.1)
Sodium: 137 mmol/L (ref 135–145)
TOTAL PROTEIN: 7.3 g/dL (ref 6.5–8.1)

## 2016-05-24 LAB — I-STAT CG4 LACTIC ACID, ED
LACTIC ACID, VENOUS: 1.99 mmol/L — AB (ref 0.5–1.9)
Lactic Acid, Venous: 1.21 mmol/L (ref 0.5–1.9)

## 2016-05-24 LAB — PROTIME-INR
INR: 0.91
Prothrombin Time: 12.3 seconds (ref 11.4–15.2)

## 2016-05-24 MED ORDER — ACETAMINOPHEN 325 MG PO TABS
650.0000 mg | ORAL_TABLET | Freq: Once | ORAL | Status: AC | PRN
  Administered 2016-05-24: 650 mg via ORAL
  Filled 2016-05-24: qty 2

## 2016-05-24 MED ORDER — SODIUM CHLORIDE 0.9 % IV BOLUS (SEPSIS)
1000.0000 mL | Freq: Once | INTRAVENOUS | Status: AC
  Administered 2016-05-24: 1000 mL via INTRAVENOUS

## 2016-05-24 MED ORDER — METOCLOPRAMIDE HCL 5 MG/ML IJ SOLN
10.0000 mg | Freq: Once | INTRAMUSCULAR | Status: AC
  Administered 2016-05-24: 10 mg via INTRAVENOUS
  Filled 2016-05-24: qty 2

## 2016-05-24 MED ORDER — DIPHENHYDRAMINE HCL 50 MG/ML IJ SOLN
25.0000 mg | Freq: Once | INTRAMUSCULAR | Status: AC
  Administered 2016-05-24: 25 mg via INTRAVENOUS
  Filled 2016-05-24: qty 1

## 2016-05-24 MED ORDER — ONDANSETRON HCL 4 MG PO TABS: 4.0000 mg | ORAL_TABLET | Freq: Three times a day (TID) | ORAL | 0 refills | Status: DC | PRN

## 2016-05-24 MED ORDER — NAPROXEN 375 MG PO TABS: 375.0000 mg | ORAL_TABLET | Freq: Two times a day (BID) | ORAL | 0 refills | Status: AC | PRN

## 2016-05-24 MED ORDER — PROMETHAZINE HCL 25 MG RE SUPP: 25.0000 mg | Freq: Three times a day (TID) | RECTAL | 0 refills | Status: DC | PRN

## 2016-05-24 MED ORDER — KETOROLAC TROMETHAMINE 15 MG/ML IJ SOLN
15.0000 mg | Freq: Once | INTRAMUSCULAR | Status: AC
  Administered 2016-05-24: 15 mg via INTRAVENOUS
  Filled 2016-05-24: qty 1

## 2016-05-24 MED ORDER — OSELTAMIVIR PHOSPHATE 75 MG PO CAPS: 75.0000 mg | ORAL_CAPSULE | Freq: Two times a day (BID) | ORAL | 0 refills | Status: AC

## 2016-05-24 MED ORDER — SODIUM CHLORIDE 0.9 % IV BOLUS (SEPSIS): 1000.0000 mL | Freq: Once | INTRAVENOUS | Status: DC
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
## 2016-05-24 NOTE — ED Triage Notes (Addendum)
Per EMS, patient from home, c/o "uncontrollable trembling" and upper back pain x1 hour. Patient reports trembling has resolved and patient now c/o headache and upper back pain. Ambulatory with EMS. Denies chest pain and SOB.

## 2016-05-25 ENCOUNTER — Encounter: Payer: Medicare Other | Admitting: Family Medicine

## 2016-05-26 LAB — URINE CULTURE: Culture: NO GROWTH

## 2016-05-29 LAB — CULTURE, BLOOD (ROUTINE X 2)
Culture: NO GROWTH
Culture: NO GROWTH

## 2016-06-05 ENCOUNTER — Ambulatory Visit (INDEPENDENT_AMBULATORY_CARE_PROVIDER_SITE_OTHER): Payer: Medicare Other | Admitting: Orthopaedic Surgery

## 2016-06-07 ENCOUNTER — Encounter: Payer: Self-pay | Admitting: Family Medicine

## 2016-06-07 ENCOUNTER — Encounter (INDEPENDENT_AMBULATORY_CARE_PROVIDER_SITE_OTHER): Payer: Medicare Other | Admitting: Family Medicine

## 2016-06-07 DIAGNOSIS — Z0289 Encounter for other administrative examinations: Secondary | ICD-10-CM
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
## 2016-06-27 ENCOUNTER — Ambulatory Visit (INDEPENDENT_AMBULATORY_CARE_PROVIDER_SITE_OTHER): Payer: Medicare Other | Admitting: Orthopaedic Surgery

## 2016-07-24 ENCOUNTER — Ambulatory Visit (INDEPENDENT_AMBULATORY_CARE_PROVIDER_SITE_OTHER): Payer: Medicare Other | Admitting: Orthopaedic Surgery

## 2016-07-24 ENCOUNTER — Encounter (INDEPENDENT_AMBULATORY_CARE_PROVIDER_SITE_OTHER): Payer: Self-pay

## 2016-08-07 ENCOUNTER — Ambulatory Visit (INDEPENDENT_AMBULATORY_CARE_PROVIDER_SITE_OTHER): Payer: Medicare Other | Admitting: Orthopaedic Surgery

## 2016-08-07 ENCOUNTER — Encounter (INDEPENDENT_AMBULATORY_CARE_PROVIDER_SITE_OTHER): Payer: Self-pay

## 2016-08-09 DIAGNOSIS — B349 Viral infection, unspecified: Secondary | ICD-10-CM | POA: Diagnosis not present

## 2016-08-09 DIAGNOSIS — K120 Recurrent oral aphthae: Secondary | ICD-10-CM | POA: Diagnosis not present

## 2016-09-07 ENCOUNTER — Ambulatory Visit: Payer: 59 | Admitting: Family Medicine

## 2016-09-07 ENCOUNTER — Telehealth: Payer: Self-pay | Admitting: *Deleted

## 2016-09-07 NOTE — Telephone Encounter (Signed)
Dr. Anitra Lauth pt has had several no shows or late cancellations. We have mailed a no show letter to pt twice. How would you like to precede?
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
## 2016-09-07 NOTE — Telephone Encounter (Signed)
Unfortunately, we have to discharge the patient from the practice.

## 2016-09-13 ENCOUNTER — Encounter: Payer: 59 | Admitting: Family Medicine
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
## 2016-09-13 NOTE — Telephone Encounter (Signed)
Pt no showed apt for today at 9:00am for CPE.

## 2016-09-18 ENCOUNTER — Other Ambulatory Visit: Payer: Self-pay | Admitting: Surgery

## 2016-09-18 DIAGNOSIS — K602 Anal fissure, unspecified: Secondary | ICD-10-CM | POA: Diagnosis not present

## 2016-09-25 ENCOUNTER — Ambulatory Visit (INDEPENDENT_AMBULATORY_CARE_PROVIDER_SITE_OTHER): Payer: Medicare Other | Admitting: Orthopaedic Surgery

## 2016-09-25 DIAGNOSIS — M47816 Spondylosis without myelopathy or radiculopathy, lumbar region: Secondary | ICD-10-CM | POA: Diagnosis not present

## 2016-09-25 DIAGNOSIS — G894 Chronic pain syndrome: Secondary | ICD-10-CM | POA: Diagnosis not present

## 2016-09-25 DIAGNOSIS — Z79899 Other long term (current) drug therapy: Secondary | ICD-10-CM | POA: Diagnosis not present

## 2016-09-25 DIAGNOSIS — M791 Myalgia: Secondary | ICD-10-CM | POA: Diagnosis not present

## 2016-09-25 DIAGNOSIS — Z79891 Long term (current) use of opiate analgesic: Secondary | ICD-10-CM | POA: Diagnosis not present

## 2016-10-02 ENCOUNTER — Telehealth (INDEPENDENT_AMBULATORY_CARE_PROVIDER_SITE_OTHER): Payer: Self-pay | Admitting: Orthopaedic Surgery

## 2016-10-02 ENCOUNTER — Ambulatory Visit (INDEPENDENT_AMBULATORY_CARE_PROVIDER_SITE_OTHER): Payer: Medicare Other | Admitting: Orthopaedic Surgery

## 2016-10-02 NOTE — Telephone Encounter (Signed)
Returned call to patient left message on voicemail to call back in reference to rescheduling appointment. [phone]

## 2016-10-04 ENCOUNTER — Ambulatory Visit (INDEPENDENT_AMBULATORY_CARE_PROVIDER_SITE_OTHER): Payer: 59

## 2016-10-04 ENCOUNTER — Ambulatory Visit (INDEPENDENT_AMBULATORY_CARE_PROVIDER_SITE_OTHER): Payer: 59 | Admitting: Orthopaedic Surgery

## 2016-10-04 ENCOUNTER — Ambulatory Visit (INDEPENDENT_AMBULATORY_CARE_PROVIDER_SITE_OTHER): Payer: Medicare Other | Admitting: Orthopaedic Surgery

## 2016-10-04 DIAGNOSIS — M25571 Pain in right ankle and joints of right foot: Secondary | ICD-10-CM

## 2016-10-04 DIAGNOSIS — M79672 Pain in left foot: Secondary | ICD-10-CM | POA: Diagnosis not present

## 2016-10-04 DIAGNOSIS — M722 Plantar fascial fibromatosis: Secondary | ICD-10-CM | POA: Diagnosis not present

## 2016-10-04 MED ORDER — METHYLPREDNISOLONE ACETATE 40 MG/ML IJ SUSP
40.0000 mg | INTRAMUSCULAR | Status: AC | PRN
  Administered 2016-10-04: 40 mg

## 2016-10-04 MED ORDER — DICLOFENAC SODIUM 1 % TD GEL: 2.0000 g | Freq: Four times a day (QID) | TRANSDERMAL | 3 refills | Status: DC

## 2016-10-04 MED ORDER — LIDOCAINE HCL 1 % IJ SOLN
1.0000 mL | INTRAMUSCULAR | Status: AC | PRN
  Administered 2016-10-04: 1 mL
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
## 2016-10-05 ENCOUNTER — Encounter: Payer: Self-pay | Admitting: Family Medicine

## 2016-10-18 ENCOUNTER — Telehealth: Payer: Self-pay | Admitting: Family Medicine

## 2016-10-18 NOTE — Telephone Encounter (Signed)
[facility]  RF request for citalopram LOV: 10/18/15 Next ov: None Last written: 10/18/15 #90 w/ 3RF  Will send for #30 w/ 0RF. Pt needs office visit for f/u [facility] or CPE.

## 2016-10-18 NOTE — Telephone Encounter (Signed)
Left message for pt to call back  °

## 2016-10-24 NOTE — Telephone Encounter (Signed)
Pt advised and voiced understanding. He stated that he will call back to schedule an apt. 

## 2016-12-07 ENCOUNTER — Other Ambulatory Visit: Payer: Self-pay | Admitting: Family Medicine

## 2016-12-07 MED ORDER — CITALOPRAM HYDROBROMIDE 40 MG PO TABS: 40.0000 mg | ORAL_TABLET | Freq: Every day | ORAL | 0 refills | Status: DC

## 2016-12-07 NOTE — Telephone Encounter (Signed)
Patients wife aware

## 2016-12-07 NOTE — Telephone Encounter (Signed)
Yes, I'll send in 1 mo supply with no RF.

## 2016-12-07 NOTE — Telephone Encounter (Signed)
Patient's wife called stating pt's CPE is scheduled 12/13/16 but he is out of celexa, can he get short supply to get him through until Friday?  Please advise.

## 2016-12-13 ENCOUNTER — Encounter: Payer: 59 | Admitting: Family Medicine
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
## 2016-12-13 NOTE — Telephone Encounter (Signed)
Pt no showed apt for today at 2:00pm for CPE.

## 2016-12-14 ENCOUNTER — Encounter: Payer: Self-pay | Admitting: Family Medicine

## 2016-12-14 NOTE — Telephone Encounter (Signed)
Pt dismissed

## 2016-12-17 ENCOUNTER — Telehealth: Payer: Self-pay | Admitting: Family Medicine

## 2016-12-17 NOTE — Telephone Encounter (Signed)
Patient dismissed from [facility] by Shawnie Dapper , effective December 14, 2016. Dismissal letter sent out by certified / registered mail.  daj

## 2017-01-01 DIAGNOSIS — K645 Perianal venous thrombosis: Secondary | ICD-10-CM | POA: Diagnosis not present

## 2017-01-10 NOTE — Telephone Encounter (Signed)
Certified dismissal letter returned as undeliverable, unclaimed, return to sender after three attempts by [employer] on January 10 2017. Letter placed in another envelope and resent as 1st class mail which does not require a signature. daj

## 2017-01-10 NOTE — Telephone Encounter (Signed)
Noted  

## 2017-01-22 DIAGNOSIS — K602 Anal fissure, unspecified: Secondary | ICD-10-CM | POA: Diagnosis not present

## 2017-01-31 DIAGNOSIS — M791 Myalgia: Secondary | ICD-10-CM | POA: Diagnosis not present

## 2017-01-31 DIAGNOSIS — G894 Chronic pain syndrome: Secondary | ICD-10-CM | POA: Diagnosis not present

## 2017-01-31 DIAGNOSIS — M5106 Intervertebral disc disorders with myelopathy, lumbar region: Secondary | ICD-10-CM | POA: Diagnosis not present

## 2017-02-22 ENCOUNTER — Ambulatory Visit: Payer: Self-pay | Admitting: Family Medicine

## 2017-04-03 ENCOUNTER — Telehealth: Payer: Self-pay | Admitting: General Practice

## 2017-04-03 NOTE — Telephone Encounter (Signed)
Nira Conn got a call from patients wife asking [place] to locate his rx for his CPAP as she stated patient was out of supplies. I called the patient directly and left him a VM advising that he had been dismissed in July and that he may need to let his wife know. In addition, if he needed records transferred to another provider we could assist him with that.

## 2017-08-16 ENCOUNTER — Emergency Department (HOSPITAL_COMMUNITY)
Admission: EM | Admit: 2017-08-16 | Discharge: 2017-08-16 | Disposition: A | Discharge status: Home / Self Care | Payer: Medicare Other | Attending: Emergency Medicine | Admitting: Emergency Medicine

## 2017-08-16 ENCOUNTER — Emergency Department (HOSPITAL_COMMUNITY): Payer: Medicare Other

## 2017-08-16 ENCOUNTER — Other Ambulatory Visit: Payer: Self-pay

## 2017-08-16 ENCOUNTER — Encounter (HOSPITAL_COMMUNITY): Payer: Self-pay | Admitting: *Deleted

## 2017-08-16 DIAGNOSIS — I480 Paroxysmal atrial fibrillation: Secondary | ICD-10-CM

## 2017-08-16 DIAGNOSIS — R079 Chest pain, unspecified: Secondary | ICD-10-CM | POA: Diagnosis present

## 2017-08-16 DIAGNOSIS — Z79899 Other long term (current) drug therapy: Secondary | ICD-10-CM | POA: Insufficient documentation

## 2017-08-16 DIAGNOSIS — F419 Anxiety disorder, unspecified: Secondary | ICD-10-CM | POA: Insufficient documentation

## 2017-08-16 LAB — BASIC METABOLIC PANEL
Anion gap: 11 (ref 5–15)
BUN: 10 mg/dL (ref 6–20)
CHLORIDE: 102 mmol/L (ref 101–111)
CO2: 23 mmol/L (ref 22–32)
CREATININE: 1.03 mg/dL (ref 0.61–1.24)
Calcium: 9.3 mg/dL (ref 8.9–10.3)
GFR calc non Af Amer: >60 mL/min (ref >60)
Glucose, Bld: 121 mg/dL — ABNORMAL HIGH (ref 65–99)
Potassium: 3.6 mmol/L (ref 3.5–5.1)
Sodium: 136 mmol/L (ref 135–145)

## 2017-08-16 LAB — CBC
HCT: 46.7 % (ref 39.0–52.0)
HEMOGLOBIN: 15.7 g/dL (ref 13.0–17.0)
MCH: 30.8 pg (ref 26.0–34.0)
MCHC: 33.6 g/dL (ref 30.0–36.0)
MCV: 91.6 fL (ref 78.0–100.0)
PLATELETS: 298 10*3/uL (ref 150–400)
RBC: 5.1 MIL/uL (ref 4.22–5.81)
RDW: 13.5 % (ref 11.5–15.5)
WBC: 5.1 10*3/uL (ref 4.0–10.5)

## 2017-08-16 LAB — I-STAT TROPONIN, ED
TROPONIN I, POC: 0 ng/mL (ref 0.00–0.08)
Troponin i, poc: 0.01 ng/mL (ref 0.00–0.08)

## 2017-08-16 LAB — BRAIN NATRIURETIC PEPTIDE: B NATRIURETIC PEPTIDE 5: 29.8 pg/mL (ref 0.0–100.0)

## 2017-08-16 LAB — MAGNESIUM: Magnesium: 2.2 mg/dL (ref 1.7–2.4)

## 2017-08-16 MED ORDER — DILTIAZEM HCL 30 MG PO TABS: 30.0000 mg | ORAL_TABLET | Freq: Three times a day (TID) | ORAL | 0 refills | Status: DC

## 2017-08-16 MED ORDER — ENOXAPARIN SODIUM 120 MG/0.8ML ~~LOC~~ SOLN
1.0000 mg/kg | Freq: Once | SUBCUTANEOUS | Status: DC
  Filled 2017-08-16: qty 0.8

## 2017-08-16 MED ORDER — DILTIAZEM HCL 30 MG PO TABS
30.0000 mg | ORAL_TABLET | Freq: Once | ORAL | Status: AC
  Administered 2017-08-16: 30 mg via ORAL
  Filled 2017-08-16: qty 1

## 2017-08-16 NOTE — ED Triage Notes (Signed)
Pt reports having mid chest discomfort for a few days and feeling lightheaded. Denies sob. Afib at triage, no hx of same.

## 2017-08-16 NOTE — Discharge Instructions (Signed)
-  Take your Cardizem as prescribed.

## 2017-08-16 NOTE — ED Provider Notes (Signed)
I saw and evaluated the patient, reviewed the resident's note and I agree with the findings and plan.   EKG Interpretation  Date/Time:  Friday August 16 2017 15:49:55 EDT Ventricular Rate:  147 PR Interval:    QRS Duration: 90 QT Interval:  310 QTC Calculation: 485 R Axis:   56 Text Interpretation:  Atrial fibrillation with rapid ventricular response T wave abnormality, consider inferior ischemia Abnormal ECG afib when compared to prior Confirmed by Lacretia Leigh ([id number]) on 08/16/2017 5:09:32 PM      47 year old male presents with palpitations as well as feeling lightheaded.  He had some sharp atypical chest pain without associated symptoms.  Notes increased alcohol intake as well as increased stress.  His EKG here shows A. fib with RVR which slowed down to A. fib with a rate between 80 and 90.  First troponin negative.  Patient's chest pain very atypical for cardiac etiology.  Will cycle troponins.  Given diltiazem here.  Will consult cardiology as well to arrange for outpatient follow-up troponin is negative.   Lacretia Leigh, MD 08/16/17 1723
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
## 2017-08-19 ENCOUNTER — Telehealth: Payer: Self-pay | Admitting: *Deleted

## 2017-08-19 NOTE — Telephone Encounter (Signed)
Call and leave message on pt voicemail to call back

## 2017-08-19 NOTE — Telephone Encounter (Signed)
-----   Message from Minus Breeding, MD sent at 08/19/2017  8:42 AM EDT ----- Regarding: FW: outpatient afib follow up  Needs new patient appt for atrial fib  ----- Message ----- From: Tobie Poet, DO Sent: 08/16/2017   8:23 PM To: Minus Breeding, MD Subject: outpatient afib follow up                      Hi Dr. Percival Spanish,   This is Dr. Nyoka Lint from [facility] emergency department.  We spoke on the phone regarding this patient for atrial fibrillation.  I will start him on Cardizem 30 mg 3 times daily as discussed.  Please ensure he will have follow-up in your clinic.  Thank you.

## 2017-08-29 ENCOUNTER — Telehealth: Payer: Self-pay | Admitting: *Deleted

## 2017-08-29 ENCOUNTER — Ambulatory Visit: Payer: 59 | Admitting: Cardiovascular Disease

## 2017-08-29 NOTE — Telephone Encounter (Signed)
Pt was schedule to see Dr Oval Linsey today @ 9:40 am but pt No Show.[name]
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
## 2017-08-29 NOTE — Telephone Encounter (Signed)
-----   Message from Minus Breeding, MD sent at 08/26/2017 10:13 PM EDT ----- I see that you called this patient a week ago but I don't see any other follow up.

## 2017-08-30 ENCOUNTER — Encounter: Payer: Self-pay | Admitting: *Deleted

## 2017-09-26 ENCOUNTER — Emergency Department (HOSPITAL_COMMUNITY)
Admission: EM | Admit: 2017-09-26 | Discharge: 2017-09-26 | Disposition: A | Discharge status: Home / Self Care | Payer: 59 | Attending: Emergency Medicine | Admitting: Emergency Medicine

## 2017-09-26 ENCOUNTER — Emergency Department (HOSPITAL_COMMUNITY): Payer: 59

## 2017-09-26 ENCOUNTER — Encounter (HOSPITAL_COMMUNITY): Payer: Self-pay | Admitting: Emergency Medicine

## 2017-09-26 DIAGNOSIS — Z79899 Other long term (current) drug therapy: Secondary | ICD-10-CM | POA: Insufficient documentation

## 2017-09-26 DIAGNOSIS — Y999 Unspecified external cause status: Secondary | ICD-10-CM | POA: Insufficient documentation

## 2017-09-26 DIAGNOSIS — Y92410 Unspecified street and highway as the place of occurrence of the external cause: Secondary | ICD-10-CM | POA: Diagnosis not present

## 2017-09-26 DIAGNOSIS — M25522 Pain in left elbow: Secondary | ICD-10-CM | POA: Insufficient documentation

## 2017-09-26 DIAGNOSIS — M25562 Pain in left knee: Secondary | ICD-10-CM | POA: Insufficient documentation

## 2017-09-26 DIAGNOSIS — Y939 Activity, unspecified: Secondary | ICD-10-CM | POA: Diagnosis not present

## 2017-09-26 DIAGNOSIS — M542 Cervicalgia: Secondary | ICD-10-CM | POA: Insufficient documentation

## 2017-09-26 DIAGNOSIS — Z7982 Long term (current) use of aspirin: Secondary | ICD-10-CM | POA: Insufficient documentation

## 2017-09-26 DIAGNOSIS — M545 Low back pain: Secondary | ICD-10-CM | POA: Insufficient documentation

## 2017-09-26 DIAGNOSIS — M25552 Pain in left hip: Secondary | ICD-10-CM | POA: Diagnosis not present

## 2017-09-26 DIAGNOSIS — M25512 Pain in left shoulder: Secondary | ICD-10-CM | POA: Diagnosis not present

## 2017-09-26 MED ORDER — CYCLOBENZAPRINE HCL 10 MG PO TABS: 10.0000 mg | ORAL_TABLET | Freq: Two times a day (BID) | ORAL | 0 refills | Status: DC | PRN
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
## 2017-10-02 ENCOUNTER — Other Ambulatory Visit: Payer: Self-pay

## 2017-10-02 ENCOUNTER — Emergency Department (HOSPITAL_COMMUNITY)
Admission: EM | Admit: 2017-10-02 | Discharge: 2017-10-02 | Disposition: A | Discharge status: Home / Self Care | Payer: 59 | Attending: Emergency Medicine | Admitting: Emergency Medicine

## 2017-10-02 DIAGNOSIS — Z79899 Other long term (current) drug therapy: Secondary | ICD-10-CM | POA: Insufficient documentation

## 2017-10-02 DIAGNOSIS — K4190 Unilateral femoral hernia, without obstruction or gangrene, not specified as recurrent: Secondary | ICD-10-CM | POA: Diagnosis not present

## 2017-10-02 DIAGNOSIS — R1909 Other intra-abdominal and pelvic swelling, mass and lump: Secondary | ICD-10-CM | POA: Diagnosis present

## 2017-10-02 NOTE — Discharge Instructions (Addendum)
Please read attached information. If you experience any new or worsening signs or symptoms please return to the emergency room for evaluation. Please follow-up with your primary care provider or specialist as discussed.  °

## 2017-10-02 NOTE — ED Triage Notes (Signed)
Patient c/o left groin swelling this a.m. Denies pain or injury.
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
## 2017-10-08 ENCOUNTER — Ambulatory Visit (INDEPENDENT_AMBULATORY_CARE_PROVIDER_SITE_OTHER): Payer: Medicare Other | Admitting: Orthopaedic Surgery

## 2017-10-15 ENCOUNTER — Other Ambulatory Visit: Payer: Self-pay | Admitting: Surgery

## 2017-11-12 ENCOUNTER — Other Ambulatory Visit: Payer: Self-pay | Admitting: Surgery

## 2017-11-13 ENCOUNTER — Ambulatory Visit (INDEPENDENT_AMBULATORY_CARE_PROVIDER_SITE_OTHER): Payer: Medicare Other | Admitting: Orthopaedic Surgery

## 2017-11-19 ENCOUNTER — Ambulatory Visit: Payer: Medicare Other | Admitting: Cardiovascular Disease
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
## 2017-11-20 ENCOUNTER — Encounter: Payer: Self-pay | Admitting: *Deleted

## 2017-11-27 ENCOUNTER — Other Ambulatory Visit: Payer: Self-pay

## 2017-11-27 ENCOUNTER — Ambulatory Visit (INDEPENDENT_AMBULATORY_CARE_PROVIDER_SITE_OTHER): Payer: Medicare Other | Admitting: Orthopaedic Surgery

## 2017-11-27 DIAGNOSIS — R0989 Other specified symptoms and signs involving the circulatory and respiratory systems: Secondary | ICD-10-CM

## 2017-12-09 ENCOUNTER — Encounter (INDEPENDENT_AMBULATORY_CARE_PROVIDER_SITE_OTHER): Payer: Self-pay | Admitting: Orthopaedic Surgery

## 2017-12-09 ENCOUNTER — Ambulatory Visit (INDEPENDENT_AMBULATORY_CARE_PROVIDER_SITE_OTHER): Payer: Medicare Other | Admitting: Orthopaedic Surgery

## 2017-12-09 ENCOUNTER — Ambulatory Visit (INDEPENDENT_AMBULATORY_CARE_PROVIDER_SITE_OTHER): Payer: Self-pay

## 2017-12-09 VITALS — Ht 69.0 in | Wt 210.0 lb

## 2017-12-09 DIAGNOSIS — M25512 Pain in left shoulder: Secondary | ICD-10-CM | POA: Insufficient documentation

## 2017-12-09 MED ORDER — METHYLPREDNISOLONE ACETATE 40 MG/ML IJ SUSP
40.0000 mg | INTRAMUSCULAR | Status: AC | PRN
  Administered 2017-12-09: 40 mg via INTRA_ARTICULAR

## 2017-12-09 MED ORDER — LIDOCAINE HCL 1 % IJ SOLN
3.0000 mL | INTRAMUSCULAR | Status: AC | PRN
  Administered 2017-12-09: 3 mL
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
## 2017-12-23 ENCOUNTER — Inpatient Hospital Stay (HOSPITAL_COMMUNITY): Admission: RE | Admit: 2017-12-23 | Payer: Medicare Other | Source: Ambulatory Visit

## 2017-12-23 ENCOUNTER — Ambulatory Visit (INDEPENDENT_AMBULATORY_CARE_PROVIDER_SITE_OTHER): Payer: Medicare Other | Admitting: Orthopaedic Surgery

## 2017-12-23 ENCOUNTER — Encounter: Payer: Medicare Other | Admitting: Vascular Surgery

## 2018-02-06 ENCOUNTER — Other Ambulatory Visit: Payer: Self-pay

## 2018-02-06 ENCOUNTER — Ambulatory Visit (HOSPITAL_COMMUNITY)
Admission: RE | Admit: 2018-02-06 | Discharge: 2018-02-06 | Disposition: A | Discharge status: Home / Self Care | Payer: Medicare Other | Source: Ambulatory Visit | Attending: Vascular Surgery | Admitting: Vascular Surgery

## 2018-02-06 ENCOUNTER — Encounter: Payer: Self-pay | Admitting: Vascular Surgery

## 2018-02-06 ENCOUNTER — Ambulatory Visit (INDEPENDENT_AMBULATORY_CARE_PROVIDER_SITE_OTHER): Payer: Medicare Other | Admitting: Vascular Surgery

## 2018-02-06 VITALS — BP 108/69 | HR 95 | Temp 97.6°F | Resp 18 | Ht 68.5 in | Wt 203.3 lb

## 2018-02-06 DIAGNOSIS — I83812 Varicose veins of left lower extremities with pain: Secondary | ICD-10-CM

## 2018-02-06 DIAGNOSIS — R0989 Other specified symptoms and signs involving the circulatory and respiratory systems: Secondary | ICD-10-CM | POA: Diagnosis present

## 2018-02-06 DIAGNOSIS — I872 Venous insufficiency (chronic) (peripheral): Secondary | ICD-10-CM | POA: Insufficient documentation
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
## 2018-04-07 ENCOUNTER — Other Ambulatory Visit: Payer: Self-pay

## 2018-04-07 DIAGNOSIS — I83812 Varicose veins of left lower extremities with pain: Secondary | ICD-10-CM

## 2018-04-08 ENCOUNTER — Emergency Department (HOSPITAL_BASED_OUTPATIENT_CLINIC_OR_DEPARTMENT_OTHER): Payer: Medicare Other

## 2018-04-08 ENCOUNTER — Encounter (HOSPITAL_COMMUNITY): Payer: Self-pay | Admitting: Emergency Medicine

## 2018-04-08 ENCOUNTER — Emergency Department (HOSPITAL_COMMUNITY)
Admission: EM | Admit: 2018-04-08 | Discharge: 2018-04-08 | Disposition: A | Discharge status: Home / Self Care | Payer: Medicare Other | Attending: Emergency Medicine | Admitting: Emergency Medicine

## 2018-04-08 ENCOUNTER — Other Ambulatory Visit: Payer: Self-pay

## 2018-04-08 DIAGNOSIS — R52 Pain, unspecified: Secondary | ICD-10-CM | POA: Diagnosis not present

## 2018-04-08 DIAGNOSIS — M79605 Pain in left leg: Secondary | ICD-10-CM | POA: Diagnosis not present

## 2018-04-08 DIAGNOSIS — Z79899 Other long term (current) drug therapy: Secondary | ICD-10-CM | POA: Diagnosis not present

## 2018-04-08 DIAGNOSIS — Z7982 Long term (current) use of aspirin: Secondary | ICD-10-CM | POA: Insufficient documentation

## 2018-04-08 DIAGNOSIS — M7989 Other specified soft tissue disorders: Secondary | ICD-10-CM

## 2018-04-08 LAB — CBC
HCT: 46.2 % (ref 39.0–52.0)
Hemoglobin: 14.9 g/dL (ref 13.0–17.0)
MCH: 29.8 pg (ref 26.0–34.0)
MCHC: 32.3 g/dL (ref 30.0–36.0)
MCV: 92.4 fL (ref 80.0–100.0)
NRBC: 0 % (ref 0.0–0.2)
PLATELETS: 297 10*3/uL (ref 150–400)
RBC: 5 MIL/uL (ref 4.22–5.81)
RDW: 12 % (ref 11.5–15.5)
WBC: 4.5 10*3/uL (ref 4.0–10.5)

## 2018-04-08 LAB — BASIC METABOLIC PANEL
ANION GAP: 6 (ref 5–15)
BUN: 13 mg/dL (ref 6–20)
CO2: 26 mmol/L (ref 22–32)
Calcium: 9.6 mg/dL (ref 8.9–10.3)
Chloride: 106 mmol/L (ref 98–111)
Creatinine, Ser: 1.05 mg/dL (ref 0.61–1.24)
Glucose, Bld: 94 mg/dL (ref 70–99)
POTASSIUM: 3.8 mmol/L (ref 3.5–5.1)
SODIUM: 138 mmol/L (ref 135–145)

## 2018-04-08 NOTE — Discharge Instructions (Addendum)
You were seen in the ER today for left leg pain. Your labs were normal. Your ultrasound showed no blood clot. There is a structure to the back of the leg, we are unsure of exactly what this is but we would like you to follow up for re-evaluation of this within 1 week with your primary care. Return to the ER for new or worsening symptoms or any other concerns.
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
## 2018-04-08 NOTE — ED Notes (Signed)
Patient transported to Ultrasound 

## 2018-04-08 NOTE — ED Notes (Signed)
Declined W/C at D/C and was escorted to lobby by RN. 

## 2018-04-08 NOTE — Progress Notes (Signed)
Left lower extremity venous duplex completed. Preliminary results - There is no evidence of a DVT or Baker's cyst. There is a small isolated non vascularized extremity superficial structure noted in an area behind the knee etiology unknown.  [facility], [place] 04/08/2018 2:54 pm

## 2018-04-16 ENCOUNTER — Telehealth: Payer: Self-pay | Admitting: General Practice

## 2018-04-16 NOTE — Telephone Encounter (Signed)
I don't think so. Pt had the opportunity to call and cancel appointments instead of no-showing. Sorry. [name].

## 2018-04-16 NOTE — Telephone Encounter (Signed)
Copied from [place] [id number]. Topic: General - Other >> Apr 15, 2018 11:43 AM [name] A wrote: Reason for CRM: Patient called to get an appointment to see Dr Anitra Lauth but he was dismissed from the practice on 12/14/16. He stated that at the time of all the missed appointments he was going through a rough time in his life caring for his sick mom and other family issues. But is is asking for a call back to let him know if he can please come back. Ph# [phone]    Dr Anitra Lauth,  See above request from patient. You do not have to accept back as he was given multiple chances. I did not want to exclude you from the decision.

## 2018-05-01 ENCOUNTER — Inpatient Hospital Stay (HOSPITAL_COMMUNITY): Admission: RE | Admit: 2018-05-01 | Payer: Medicare Other | Source: Ambulatory Visit

## 2018-05-01 ENCOUNTER — Ambulatory Visit: Payer: Medicare Other | Admitting: Vascular Surgery

## 2018-05-02 ENCOUNTER — Encounter: Payer: Self-pay | Admitting: Vascular Surgery

## 2018-05-14 ENCOUNTER — Emergency Department (HOSPITAL_COMMUNITY): Payer: Medicare Other

## 2018-05-14 ENCOUNTER — Encounter (HOSPITAL_COMMUNITY): Payer: Self-pay

## 2018-05-14 ENCOUNTER — Emergency Department (HOSPITAL_COMMUNITY)
Admission: EM | Admit: 2018-05-14 | Discharge: 2018-05-14 | Disposition: A | Discharge status: Home / Self Care | Payer: Medicare Other | Attending: Emergency Medicine | Admitting: Emergency Medicine

## 2018-05-14 DIAGNOSIS — R0789 Other chest pain: Secondary | ICD-10-CM | POA: Diagnosis present

## 2018-05-14 DIAGNOSIS — R0781 Pleurodynia: Secondary | ICD-10-CM

## 2018-05-14 DIAGNOSIS — Z79899 Other long term (current) drug therapy: Secondary | ICD-10-CM | POA: Insufficient documentation

## 2018-05-14 DIAGNOSIS — Z86711 Personal history of pulmonary embolism: Secondary | ICD-10-CM | POA: Diagnosis not present

## 2018-05-14 DIAGNOSIS — W208XXA Other cause of strike by thrown, projected or falling object, initial encounter: Secondary | ICD-10-CM | POA: Diagnosis not present

## 2018-05-14 MED ORDER — CYCLOBENZAPRINE HCL 10 MG PO TABS: 10.0000 mg | ORAL_TABLET | Freq: Three times a day (TID) | ORAL | 0 refills | Status: DC | PRN

## 2018-05-14 MED ORDER — NAPROXEN 500 MG PO TABS: 500.0000 mg | ORAL_TABLET | Freq: Two times a day (BID) | ORAL | 0 refills | Status: DC

## 2018-05-14 MED ORDER — METHOCARBAMOL 500 MG PO TABS: 500.0000 mg | ORAL_TABLET | Freq: Three times a day (TID) | ORAL | 0 refills | Status: DC | PRN
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
## 2018-05-14 NOTE — Discharge Instructions (Addendum)
You are seen in the emergency department today for an injury to your right lower chest wall.  Your x-ray did not show fracture or dislocation.  There was an incidental finding of a stone in your kidney, we do not think this is contributing to your symptoms today.  We are sending you home with medicines to help with your pain.  - Naproxen is a nonsteroidal anti-inflammatory medication that will help with pain and swelling. Be sure to take this medication as prescribed with food, 1 pill every 12 hours,  It should be taken with food, as it can cause stomach upset, and more seriously, stomach bleeding. Do not take other nonsteroidal anti-inflammatory medications with this such as Advil, Motrin, Aleve, Mobic, Goodie Powder, or Motrin.    - Flexiril is the muscle relaxer I have prescribed, this is meant to help with muscle tightness. Be aware that this medication may make you drowsy therefore the first time you take this it should be at a time you are in an environment where you can rest. Do not drive or operate heavy machinery when taking this medication. Do not drink alcohol or take other sedating medications with this medicine such as narcotics or benzodiazepines.   You make take Tylenol per over the counter dosing with these medications.   We have prescribed you new medication(s) today. Discuss the medications prescribed today with your pharmacist as they can have adverse effects and interactions with your other medicines including over the counter and prescribed medications. Seek medical evaluation if you start to experience new or abnormal symptoms after taking one of these medicines, seek care immediately if you start to experience difficulty breathing, feeling of your throat closing, facial swelling, or rash as these could be indications of a more serious allergic reaction     We are also sending you home with an incentive spirometer to ensure that you have good air movement, please use this 4-6 times  per day.  Please follow-up with primary care within 1 week.  Return to the ER for new or worsening symptoms including but not limited to worsening pain, shortness of breath, fever, cough with green/yellow mucus sputum, coughing up blood, blood in your stool, blood in your urine, abdominal pain, or any other concerns.

## 2018-05-14 NOTE — ED Triage Notes (Addendum)
Patient states he was lead over a big box putting another heavy box on top of it and went he bent down he heard "a pop" on the RIGHT side. States it's only painful when he bends over or turns to the RIGHT side. Denies falling down, chest pain, SOB, abdominal pain, or dysuria.

## 2018-05-14 NOTE — ED Notes (Signed)
Pt provided with incentive spirometer and d/c instructions. Pt verbalized understanding and ambulatory with his family member at discharge.

## 2018-05-26 ENCOUNTER — Encounter: Payer: Self-pay | Admitting: Gastroenterology

## 2018-05-28 ENCOUNTER — Other Ambulatory Visit: Payer: Self-pay

## 2018-05-28 ENCOUNTER — Encounter (HOSPITAL_BASED_OUTPATIENT_CLINIC_OR_DEPARTMENT_OTHER): Payer: Self-pay

## 2018-05-28 ENCOUNTER — Emergency Department (HOSPITAL_BASED_OUTPATIENT_CLINIC_OR_DEPARTMENT_OTHER)
Admission: EM | Admit: 2018-05-28 | Discharge: 2018-05-28 | Disposition: A | Discharge status: Home / Self Care | Payer: Medicare Other | Attending: Emergency Medicine | Admitting: Emergency Medicine

## 2018-05-28 DIAGNOSIS — R252 Cramp and spasm: Secondary | ICD-10-CM | POA: Insufficient documentation

## 2018-05-28 DIAGNOSIS — R1084 Generalized abdominal pain: Secondary | ICD-10-CM | POA: Diagnosis present

## 2018-05-28 DIAGNOSIS — R0789 Other chest pain: Secondary | ICD-10-CM | POA: Insufficient documentation

## 2018-05-28 DIAGNOSIS — R109 Unspecified abdominal pain: Secondary | ICD-10-CM

## 2018-05-28 DIAGNOSIS — Z79899 Other long term (current) drug therapy: Secondary | ICD-10-CM | POA: Diagnosis not present

## 2018-05-28 DIAGNOSIS — Z7982 Long term (current) use of aspirin: Secondary | ICD-10-CM | POA: Insufficient documentation

## 2018-05-28 LAB — COMPREHENSIVE METABOLIC PANEL
ALT: 17 U/L (ref 0–44)
AST: 15 U/L (ref 15–41)
Albumin: 4.3 g/dL (ref 3.5–5.0)
Alkaline Phosphatase: 34 U/L — ABNORMAL LOW (ref 38–126)
Anion gap: 6 (ref 5–15)
BUN: 13 mg/dL (ref 6–20)
CO2: 25 mmol/L (ref 22–32)
CREATININE: 0.93 mg/dL (ref 0.61–1.24)
Calcium: 9.4 mg/dL (ref 8.9–10.3)
Chloride: 106 mmol/L (ref 98–111)
GFR calc Af Amer: >60 mL/min (ref >60)
GLUCOSE: 98 mg/dL (ref 70–99)
Potassium: 4.2 mmol/L (ref 3.5–5.1)
Sodium: 137 mmol/L (ref 135–145)
Total Bilirubin: 0.5 mg/dL (ref 0.3–1.2)
Total Protein: 7 g/dL (ref 6.5–8.1)

## 2018-05-28 LAB — CBC
HEMATOCRIT: 45.7 % (ref 39.0–52.0)
Hemoglobin: 14.6 g/dL (ref 13.0–17.0)
MCH: 29.7 pg (ref 26.0–34.0)
MCHC: 31.9 g/dL (ref 30.0–36.0)
MCV: 92.9 fL (ref 80.0–100.0)
Platelets: 238 10*3/uL (ref 150–400)
RBC: 4.92 MIL/uL (ref 4.22–5.81)
RDW: 12 % (ref 11.5–15.5)
WBC: 4.7 10*3/uL (ref 4.0–10.5)
nRBC: 0 % (ref 0.0–0.2)

## 2018-05-28 LAB — LIPASE, BLOOD: Lipase: 37 U/L (ref 11–51)

## 2018-05-28 MED ORDER — HYOSCYAMINE SULFATE 0.125 MG PO TABS: 0.1250 mg | ORAL_TABLET | ORAL | 0 refills | Status: DC | PRN

## 2018-05-28 NOTE — ED Notes (Signed)
Pt denies fevers.
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
## 2018-05-28 NOTE — Discharge Instructions (Signed)
Take Levsin as needed for abdominal spasm.  Return if you have fever, worsening abdominal pain, bloody stool or persistent vomiting.

## 2018-05-28 NOTE — ED Triage Notes (Addendum)
C/o lower abd pain, nausea, mucus in stools-sx started 12/25-NAD-steady gait

## 2018-06-24 ENCOUNTER — Ambulatory Visit: Payer: Medicare Other | Admitting: Gastroenterology

## 2018-10-07 ENCOUNTER — Other Ambulatory Visit: Payer: Self-pay

## 2018-10-07 ENCOUNTER — Ambulatory Visit (INDEPENDENT_AMBULATORY_CARE_PROVIDER_SITE_OTHER): Payer: Medicare Other | Admitting: Gastroenterology

## 2018-10-07 ENCOUNTER — Encounter: Payer: Self-pay | Admitting: Gastroenterology

## 2018-10-07 DIAGNOSIS — R197 Diarrhea, unspecified: Secondary | ICD-10-CM

## 2018-10-07 DIAGNOSIS — K219 Gastro-esophageal reflux disease without esophagitis: Secondary | ICD-10-CM

## 2018-10-07 DIAGNOSIS — K921 Melena: Secondary | ICD-10-CM | POA: Diagnosis not present
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
## 2018-10-07 NOTE — Patient Instructions (Signed)
Increase your omeprazole to 40 mg daily. A new prescription has been sent to your pharmacy.   Discontinue Mylanta.   Take Tums or Pepcid AC twice daily as needed.   Patient advised to avoid spicy, acidic, citrus, chocolate, mints, fruit and fruit juices.  Limit the intake of caffeine, alcohol and Soda.  Don't exercise too soon after eating.  Don't lie down within 3-4 hours of eating.  Elevate the head of your bed.

## 2018-10-08 ENCOUNTER — Telehealth: Payer: Self-pay | Admitting: Gastroenterology

## 2018-10-08 MED ORDER — OMEPRAZOLE 40 MG PO CPDR: 40.0000 mg | DELAYED_RELEASE_CAPSULE | Freq: Every day | ORAL | 3 refills | Status: DC

## 2018-10-08 NOTE — Telephone Encounter (Signed)
Pt said that he uses [facility] [address] in [place].

## 2018-10-08 NOTE — Telephone Encounter (Signed)
Omeprazole resent to [facility] [address] in [place].

## 2018-10-27 ENCOUNTER — Telehealth: Payer: Self-pay | Admitting: Gastroenterology

## 2018-10-27 NOTE — Telephone Encounter (Signed)
Patient called is currently having a flare up and would like to know if their is something we proscribe to him

## 2018-10-27 NOTE — Telephone Encounter (Signed)
Patient reports that he relaxed on his diet and has GERD.  He will increase his omeprazole to BID.  Patient instructed to maintain an anti-reflux diet. Advised to avoid caffeine, mint, citrus foods/juices, tomatoes,  chocolate, NSAIDS/ASA products.  Instructed not to eat within 2 hours of exercise or bed, multiple small meals are better than 3 large meals.  Need to take PPI 30 minutes prior to 1st meal of the day and second at HS.  He will keep the follow up for 11/07/18.

## 2018-10-29 ENCOUNTER — Other Ambulatory Visit: Payer: Self-pay

## 2018-10-29 ENCOUNTER — Encounter (HOSPITAL_BASED_OUTPATIENT_CLINIC_OR_DEPARTMENT_OTHER): Payer: Self-pay | Admitting: Emergency Medicine

## 2018-10-29 ENCOUNTER — Emergency Department (HOSPITAL_BASED_OUTPATIENT_CLINIC_OR_DEPARTMENT_OTHER)
Admission: EM | Admit: 2018-10-29 | Discharge: 2018-10-29 | Disposition: A | Discharge status: Home / Self Care | Payer: Medicare Other | Attending: Emergency Medicine | Admitting: Emergency Medicine

## 2018-10-29 DIAGNOSIS — R1084 Generalized abdominal pain: Secondary | ICD-10-CM | POA: Insufficient documentation

## 2018-10-29 DIAGNOSIS — R202 Paresthesia of skin: Secondary | ICD-10-CM | POA: Insufficient documentation

## 2018-10-29 DIAGNOSIS — Z79899 Other long term (current) drug therapy: Secondary | ICD-10-CM | POA: Insufficient documentation

## 2018-10-29 LAB — COMPREHENSIVE METABOLIC PANEL
ALT: 23 U/L (ref 0–44)
AST: 18 U/L (ref 15–41)
Albumin: 4.3 g/dL (ref 3.5–5.0)
Alkaline Phosphatase: 34 U/L — ABNORMAL LOW (ref 38–126)
Anion gap: 6 (ref 5–15)
BUN: 17 mg/dL (ref 6–20)
CO2: 26 mmol/L (ref 22–32)
Calcium: 9.4 mg/dL (ref 8.9–10.3)
Chloride: 106 mmol/L (ref 98–111)
Creatinine, Ser: 0.9 mg/dL (ref 0.61–1.24)
GFR calc Af Amer: >60 mL/min (ref >60)
GFR calc non Af Amer: >60 mL/min (ref >60)
Glucose, Bld: 107 mg/dL — ABNORMAL HIGH (ref 70–99)
Potassium: 3.9 mmol/L (ref 3.5–5.1)
Sodium: 138 mmol/L (ref 135–145)
Total Bilirubin: 0.8 mg/dL (ref 0.3–1.2)
Total Protein: 7 g/dL (ref 6.5–8.1)

## 2018-10-29 LAB — CBC
HCT: 42.7 % (ref 39.0–52.0)
Hemoglobin: 14.1 g/dL (ref 13.0–17.0)
MCH: 29.7 pg (ref 26.0–34.0)
MCHC: 33 g/dL (ref 30.0–36.0)
MCV: 89.9 fL (ref 80.0–100.0)
Platelets: 238 10*3/uL (ref 150–400)
RBC: 4.75 MIL/uL (ref 4.22–5.81)
RDW: 12.1 % (ref 11.5–15.5)
WBC: 3.8 10*3/uL — ABNORMAL LOW (ref 4.0–10.5)
nRBC: 0 % (ref 0.0–0.2)

## 2018-10-29 LAB — LIPASE, BLOOD: Lipase: 37 U/L (ref 11–51)

## 2018-10-29 NOTE — Telephone Encounter (Signed)
Omeprazole 40 mg po bid taken 30 minutes before breakfast, dinner Pepcid AC po bid prn TUMS prn Mylanta prn closely follow all antireflux measures

## 2018-10-29 NOTE — Telephone Encounter (Signed)
Patient notified

## 2018-10-29 NOTE — ED Triage Notes (Signed)
Abdominal pain for several weeks.  Pt was placed on Prilosec two weeks ago which was effective for several days but then things worsened.  Pt has tried otc maalox, tums, etc in addition to Prilosec but is not improving.  Pt states he feels the pain is now traveling to lower abdomen.  No dark stools or coffee grounds colored stools.   Pt c/o burning in stomach and skin, like he has put on icy hot.  Denies loss of taste or smell.

## 2018-10-29 NOTE — Telephone Encounter (Signed)
Dr. Fuller Plan, do you have any additional recommendations until OV on 6/9?

## 2018-10-29 NOTE — Telephone Encounter (Signed)
Patient called said that he is feeling worse would like to know if there is anything else he should do. He would not like to go to the hospital and I have already moved his appt up to June 9 for a in person visit.

## 2018-10-29 NOTE — Discharge Instructions (Addendum)
Follow-up with the gastroenterologist as planned.
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
## 2018-11-03 ENCOUNTER — Telehealth: Payer: Self-pay | Admitting: General Surgery

## 2018-11-03 NOTE — Telephone Encounter (Signed)
Appointment is scheduled for in person- need covid 19 scdreening

## 2018-11-03 NOTE — Telephone Encounter (Signed)
Covid-19 screening questions  Have you traveled in the last 14 days? NO If yes where?  Do you now or have you had a fever in the last 14 days? NO  Do you have any respiratory symptoms of shortness of breath or cough now or in the last 14 days?NO  Do you have any family members or close contacts with diagnosed or suspected Covid-19 in the past 14 days?NO  Have you been tested for Covid-19 and found to be positive?NO   Patient was instructed to wear a mask and come to the clinic alone. Patient verbalized understanding.

## 2018-11-03 NOTE — Telephone Encounter (Signed)
Called to pre-screen for virtual appointment on 11/04/2018. The patients does not have a voicemail that is set up to leave a message.

## 2018-11-04 ENCOUNTER — Ambulatory Visit: Payer: Medicare Other | Admitting: Gastroenterology

## 2018-11-04 ENCOUNTER — Encounter: Payer: Self-pay | Admitting: Gastroenterology

## 2018-11-04 ENCOUNTER — Telehealth: Payer: Self-pay | Admitting: *Deleted

## 2018-11-04 ENCOUNTER — Ambulatory Visit (INDEPENDENT_AMBULATORY_CARE_PROVIDER_SITE_OTHER): Payer: Medicare Other | Admitting: Gastroenterology

## 2018-11-04 VITALS — BP 108/70 | HR 62 | Temp 98.0°F | Ht 69.0 in | Wt 207.4 lb

## 2018-11-04 DIAGNOSIS — K219 Gastro-esophageal reflux disease without esophagitis: Secondary | ICD-10-CM

## 2018-11-04 DIAGNOSIS — R1013 Epigastric pain: Secondary | ICD-10-CM

## 2018-11-04 MED ORDER — GLYCOPYRROLATE 2 MG PO TABS: 2.0000 mg | ORAL_TABLET | Freq: Two times a day (BID) | ORAL | 3 refills | Status: DC

## 2018-11-04 NOTE — Progress Notes (Signed)
    History of Present Illness: This is a 48 year old male returning for follow-up of GERD and abdominal pain.  ED evaluation on June 3 for abdominal pain associated with a tingling sensation that went to his extremities. CBC, CMP, lipase were unremarkable. EDP felt component of anxiety could be involved. Primarily epigastric pain and burning however sometimes has lower abdominal burning. Had diarrhea for a few days that resolved after stopping Mylanta. No recent hemorrhoid symptoms. He notes mild intermittent constipation that has been infrequent and present for months.  Current Medications, Allergies, Past Medical History, Past Surgical History, Family History and Social History were reviewed in [employer] record.   Physical Exam: General: Well developed, well nourished, no acute distress Head: Normocephalic and atraumatic Eyes:  sclerae anicteric, EOMI Ears: Normal auditory acuity Mouth: No deformity or lesions Lungs: Clear throughout to auscultation Heart: Regular rate and rhythm; no murmurs, rubs or bruits Abdomen: Soft, mildender and non distended. No masses, hepatosplenomegaly or hernias noted. Normal Bowel sounds Rectal: No done Musculoskeletal: Symmetrical with no gross deformities  Pulses:  Normal pulses noted Extremities: No clubbing, cyanosis, edema or deformities noted Neurological: Alert oriented x 4, grossly nonfocal Psychological:  Alert and cooperative. Normal mood and affect   Assessment and Recommendations:  1. GERD. Possible functional dyspepsia. Continue omeprazole 40 mg po qam and Pepcid AC bid prn. Begin glycopyrrolate 2 mg po bid. Follow antireflux measures. Schedule EGD. The risks (including bleeding, perforation, infection, missed lesions, medication reactions and possible hospitalization or surgery if complications occur), benefits, and alternatives to endoscopy with possible biopsy and possible dilation were discussed with the patient and  they consent to proceed.   2.  Diarrhea resolved after stopping Mylanta and he is back to his baseline of occasional mild constipation.  3.  History of intermittent hematochezia and hemorrhoids. Symptoms currently not active. If symptoms return consider colonoscopy.

## 2018-11-04 NOTE — Patient Instructions (Addendum)
You have been scheduled for an endoscopy. Please follow written instructions given to you at your visit today. If you use inhalers (even only as needed), please bring them with you on the day of your procedure. Your physician has requested that you go to www.startemmi.com and enter the access code given to you at your visit today. This web site gives a general overview about your procedure. However, you should still follow specific instructions given to you by our office regarding your preparation for the procedure.   We have sent the following medications to your pharmacy for you to pick up at your convenience: Glycopyrrolate  Continue Omeprazole.   Thank you for choosing me and [facility].  Pricilla Riffle. Dagoberto Ligas., MD., Marval Regal

## 2018-11-04 NOTE — Telephone Encounter (Signed)
Unable to leave message voicemail not set up. 

## 2018-11-05 ENCOUNTER — Telehealth: Payer: Self-pay | Admitting: *Deleted

## 2018-11-05 NOTE — Telephone Encounter (Signed)
No message left voice mail not set up attempted x2

## 2018-11-06 ENCOUNTER — Ambulatory Visit (AMBULATORY_SURGERY_CENTER): Payer: Medicare Other | Admitting: Gastroenterology

## 2018-11-06 ENCOUNTER — Other Ambulatory Visit: Payer: Self-pay

## 2018-11-06 ENCOUNTER — Encounter: Payer: Self-pay | Admitting: Gastroenterology

## 2018-11-06 VITALS — BP 125/76 | HR 54 | Temp 98.4°F | Resp 9 | Ht 69.0 in | Wt 207.0 lb

## 2018-11-06 DIAGNOSIS — K449 Diaphragmatic hernia without obstruction or gangrene: Secondary | ICD-10-CM | POA: Diagnosis not present

## 2018-11-06 DIAGNOSIS — K2970 Gastritis, unspecified, without bleeding: Secondary | ICD-10-CM | POA: Diagnosis not present

## 2018-11-06 DIAGNOSIS — K228 Other specified diseases of esophagus: Secondary | ICD-10-CM | POA: Diagnosis not present

## 2018-11-06 DIAGNOSIS — R1010 Upper abdominal pain, unspecified: Secondary | ICD-10-CM

## 2018-11-06 MED ORDER — SODIUM CHLORIDE 0.9 % IV SOLN: 500.0000 mL | INTRAVENOUS | Status: DC

## 2018-11-06 NOTE — Op Note (Signed)
[name]: [name] Procedure Date: 11/06/2018 7:17 AM MRN: [id number] Endoscopist: Ladene Artist , MD Age: 48 Referring MD:  Date of Birth: Oct 22, 1970 Gender: Male Account #: [id number] Procedure:                Upper GI endoscopy Indications:              Upper abdominal pain Medicines:                Monitored Anesthesia Care Procedure:                Pre-Anesthesia Assessment:                           - Prior to the procedure, a History and Physical                            was performed, and patient medications and                            allergies were reviewed. The patient's tolerance of                            previous anesthesia was also reviewed. The risks                            and benefits of the procedure and the sedation                            options and risks were discussed with the patient.                            All questions were answered, and informed consent                            was obtained. Prior Anticoagulants: The patient has                            taken no previous anticoagulant or antiplatelet                            agents. ASA Grade Assessment: II - A patient with                            mild systemic disease. After reviewing the risks                            and benefits, the patient was deemed in                            satisfactory condition to undergo the procedure.                           After obtaining informed consent, the endoscope was  passed under direct vision. Throughout the                            procedure, the patient's blood pressure, pulse, and                            oxygen saturations were monitored continuously. The                            Model GIF-HQ190 [id number]) scope was introduced                            through the mouth, and advanced to the second part                            of duodenum. The upper GI  endoscopy was                            accomplished without difficulty. The patient                            tolerated the procedure well. Scope In: Scope Out: Findings:                 One benign-appearing, intrinsic mild stenosis was                            found 40 cm from the incisors. This stenosis                            measured 1.5 cm (inner diameter). The stenosis was                            traversed.                           The exam of the esophagus was otherwise normal.                           A small hiatal hernia was present.                           Patchy mildly erythematous mucosa without bleeding                            was found in the gastric body and in the gastric                            antrum. Biopsies were taken with a cold forceps for                            histology.                           The exam of the stomach was otherwise normal.  The duodenal bulb and second portion of the                            duodenum were normal. Complications:            No immediate complications. Estimated Blood Loss:     Estimated blood loss was minimal. Impression:               - Benign-appearing esophageal stenosis.                           - Small hiatal hernia.                           - Erythematous mucosa in the gastric body and                            antrum. Biopsied.                           - Normal duodenal bulb and second portion of the                            duodenum. Recommendation:           - Patient has a contact number available for                            emergencies. The signs and symptoms of potential                            delayed complications were discussed with the                            patient. Return to normal activities tomorrow.                            Written discharge instructions were provided to the                            patient.                            - Resume previous diet.                           - Continue present medications.                           - Await pathology results. Ladene Artist, MD 11/06/2018 7:54:15 AM This report has been signed electronically.

## 2018-11-06 NOTE — Patient Instructions (Signed)
Handout given for hiatal hernia.  YOU HAD AN ENDOSCOPIC PROCEDURE TODAY AT [facility]:   Refer to the procedure report that was given to you for any specific questions about what was found during the examination.  If the procedure report does not answer your questions, please call your gastroenterologist to clarify.  If you requested that your care partner not be given the details of your procedure findings, then the procedure report has been included in a sealed envelope for you to review at your convenience later.  YOU SHOULD EXPECT: Some feelings of bloating in the abdomen. Passage of more gas than usual.  Walking can help get rid of the air that was put into your GI tract during the procedure and reduce the bloating. If you had a lower endoscopy (such as a colonoscopy or flexible sigmoidoscopy) you may notice spotting of blood in your stool or on the toilet paper. If you underwent a bowel prep for your procedure, you may not have a normal bowel movement for a few days.  Please Note:  You might notice some irritation and congestion in your nose or some drainage.  This is from the oxygen used during your procedure.  There is no need for concern and it should clear up in a day or so.  SYMPTOMS TO REPORT IMMEDIATELY:    Following upper endoscopy (EGD)  Vomiting of blood or coffee ground material  New chest pain or pain under the shoulder blades  Painful or persistently difficult swallowing  New shortness of breath  Fever of 100F or higher  Black, tarry-looking stools  For urgent or emergent issues, a gastroenterologist can be reached at any hour by calling [phone].   DIET:  We do recommend a small meal at first, but then you may proceed to your regular diet.  Drink plenty of fluids but you should avoid alcoholic beverages for 24 hours.  ACTIVITY:  You should plan to take it easy for the rest of today and you should NOT DRIVE or use heavy machinery until tomorrow  (because of the sedation medicines used during the test).    FOLLOW UP: Our staff will call the number listed on your records 48-72 hours following your procedure to check on you and address any questions or concerns that you may have regarding the information given to you following your procedure. If we do not reach you, we will leave a message.  We will attempt to reach you two times.  During this call, we will ask if you have developed any symptoms of COVID 19. If you develop any symptoms (ie: fever, flu-like symptoms, shortness of breath, cough etc.) before then, please call [phone].  If you test positive for Covid 19 in the 2 weeks post procedure, please call and report this information to [place].    If any biopsies were taken you will be contacted by phone or by letter within the next 1-3 weeks.  Please call us at [phone] if you have not heard about the biopsies in 3 weeks.    SIGNATURES/CONFIDENTIALITY: You and/or your care partner have signed paperwork which will be entered into your electronic medical record.  These signatures attest to the fact that that the information above on your After Visit Summary has been reviewed and is understood.  Full responsibility of the confidentiality of this discharge information lies with you and/or your care-partner.

## 2018-11-06 NOTE — Progress Notes (Signed)
Called to room to assist during endoscopic procedure.  Patient ID and intended procedure confirmed with present staff. Received instructions for my participation in the procedure from the performing physician.  

## 2018-11-06 NOTE — Progress Notes (Signed)
Report to PACU, RN, vss, BBS= Clear.  

## 2018-11-07 ENCOUNTER — Ambulatory Visit: Payer: Medicare Other | Admitting: Gastroenterology

## 2018-11-10 ENCOUNTER — Telehealth: Payer: Self-pay

## 2018-11-10 NOTE — Telephone Encounter (Signed)
No voicemail - unable to leave message

## 2018-11-10 NOTE — Telephone Encounter (Signed)
No voicemail set up so unable to leave message on 2nd follow up call.

## 2018-11-25 ENCOUNTER — Encounter: Payer: Self-pay | Admitting: Gastroenterology

## 2019-06-15 ENCOUNTER — Telehealth: Payer: Self-pay | Admitting: Gastroenterology

## 2019-06-15 NOTE — Telephone Encounter (Signed)
Patient calling- stating that hes heart is skipping beats- and pressure in his throat. He is thinking this has to do with his hiatal hernia. He is asking if Dr. Fuller Plan can order and ultrasound or whatever he can do to check it out to see what is going on.

## 2019-06-15 NOTE — Telephone Encounter (Signed)
Left message for patient to call back  

## 2019-06-16 NOTE — Telephone Encounter (Signed)
Patient is advised he should keep his appt with Dr. Fuller Plan on 1/29 to further discuss his symptoms.

## 2019-06-26 ENCOUNTER — Encounter: Payer: Self-pay | Admitting: Gastroenterology

## 2019-06-26 ENCOUNTER — Ambulatory Visit (INDEPENDENT_AMBULATORY_CARE_PROVIDER_SITE_OTHER): Payer: Medicare Other | Admitting: Gastroenterology

## 2019-06-26 VITALS — BP 110/70 | HR 68 | Temp 98.8°F | Ht 68.25 in | Wt 205.0 lb

## 2019-06-26 DIAGNOSIS — K219 Gastro-esophageal reflux disease without esophagitis: Secondary | ICD-10-CM | POA: Diagnosis not present

## 2019-06-26 DIAGNOSIS — R070 Pain in throat: Secondary | ICD-10-CM | POA: Diagnosis not present

## 2019-06-26 DIAGNOSIS — R079 Chest pain, unspecified: Secondary | ICD-10-CM

## 2019-06-26 NOTE — Patient Instructions (Signed)
You have been scheduled for a Barium Esophogram at [facility] (1st floor of the hospital) on 07/03/19 at 11:00am. Please arrive 15 minutes prior to your appointment for registration. Make certain not to have anything to eat or drink 3 hours prior to your test. If you need to reschedule for any reason, please contact radiology at [phone] to do so. __________________________________________________________________ A barium swallow is an examination that concentrates on views of the esophagus. This tends to be a double contrast exam (barium and two liquids which, when combined, create a gas to distend the wall of the oesophagus) or single contrast (non-ionic iodine based). The study is usually tailored to your symptoms so a good history is essential. Attention is paid during the study to the form, structure and configuration of the esophagus, looking for functional disorders (such as aspiration, dysphagia, achalasia, motility and reflux) EXAMINATION You may be asked to change into a gown, depending on the type of swallow being performed. A radiologist and radiographer will perform the procedure. The radiologist will advise you of the type of contrast selected for your procedure and direct you during the exam. You will be asked to stand, sit or lie in several different positions and to hold a small amount of fluid in your mouth before being asked to swallow while the imaging is performed .In some instances you may be asked to swallow barium coated marshmallows to assess the motility of a solid food bolus. The exam can be recorded as a digital or video fluoroscopy procedure. POST PROCEDURE It will take 1-2 days for the barium to pass through your system. To facilitate this, it is important, unless otherwise directed, to increase your fluids for the next 24-48hrs and to resume your normal diet.  This test typically takes about 30 minutes to  perform. _________________________________________________________________  Normal BMI (Body Mass Index- based on height and weight) is between 19 and 25. Your BMI today is Body mass index is 30.94 kg/m. [name] Please consider follow up  regarding your BMI with your Primary Care Provider.  Thank you for choosing me and [facility].  Pricilla Riffle. Dagoberto Ligas., MD., Marval Regal

## 2019-06-26 NOTE — Progress Notes (Signed)
    History of Present Illness: This is a 49 year old male complaining of episodic chest and throat pain.  He relates to times where chest pain will develop and radiate to his throat this is been associated with palpitations and feelings of anxiety.  He has been maintained on pantoprazole.  He notes frequent problems with gas and belching.  He states on occasion belching has relieved symptoms.  He was evaluated by [facility] in [place] in July.  Their impression was IBS, gallbladder sludge, fatty liver, anxiety disorder.  He underwent a CT scan of the abdomen pelvis on February 09, 2019 showing bilateral renal calculi lumbar spine DDD, bilateral renal cysts and diverticulosis.  He decided to return to our practice for ongoing GI care.  He is concerned his hiatal hernia may be worsening causing the symptoms which I reassured him was very unlikely.  EGD 10/2018 - Benign-appearing esophageal stenosis. - Small hiatal hernia. - Erythematous mucosa in the gastric body and antrum. Biopsied. (mild chronic gastritis) - Normal duodenal bulb and second portion of the duodenum.   Current Medications, Allergies, Past Medical History, Past Surgical History, Family History and Social History were reviewed in [employer] record.   Physical Exam: General: Well developed, well nourished, no acute distress Head: Normocephalic and atraumatic Eyes:  sclerae anicteric, EOMI Ears: Normal auditory acuity Mouth: No deformity or lesions Lungs: Clear throughout to auscultation Heart: Regular rate and rhythm; no murmurs, rubs or bruits Abdomen: Soft, non tender and non distended. No masses, hepatosplenomegaly or hernias noted. Normal Bowel sounds Rectal: Not done  Musculoskeletal: Symmetrical with no gross deformities  Pulses:  Normal pulses noted Extremities: No clubbing, cyanosis, edema or deformities noted Neurological: Alert oriented x 4, grossly nonfocal Psychological:  Alert  and cooperative.  Anxious.   Assessment and Recommendations:  1.  Chest pain, throat pain, palpitations, gas.  Appears to have a combination of anxiety and intestinal gas.  He has underlying GERD but this does not appear to be causing his chest pain or throat pain.  Given his concerns about his chest pain, throat pain and the unlikely possibility of an enlarging hiatal hernia we will schedule barium esophagram to further assess. Continue pantoprazole 40 mg p.o. daily.  Gas-X 4 times daily as needed.  Follow antireflux measures and a low gas diet.  Maintain follow-up with PCP, Glynis Smiles, NP for management.  2.  Fatty liver noted on CT scan.  Long-term carb modified, fat modified weight loss diet to achieve a normal BMI followed by his PCP.  3.  Gallbladder sludge.  Currently asymptomatic.  4. GAD.  This is contributing to his symptoms or could be the primary cause of his symptoms.  Follow-up with PCP for further management.

## 2019-07-03 ENCOUNTER — Other Ambulatory Visit: Payer: Self-pay

## 2019-07-03 ENCOUNTER — Ambulatory Visit (HOSPITAL_COMMUNITY)
Admission: RE | Admit: 2019-07-03 | Discharge: 2019-07-03 | Disposition: A | Discharge status: Home / Self Care | Payer: Medicare Other | Source: Ambulatory Visit | Attending: Gastroenterology | Admitting: Gastroenterology

## 2019-07-03 DIAGNOSIS — K219 Gastro-esophageal reflux disease without esophagitis: Secondary | ICD-10-CM | POA: Diagnosis present

## 2019-07-03 DIAGNOSIS — R079 Chest pain, unspecified: Secondary | ICD-10-CM | POA: Diagnosis present

## 2019-07-03 DIAGNOSIS — R070 Pain in throat: Secondary | ICD-10-CM | POA: Diagnosis present

## 2019-07-31 ENCOUNTER — Emergency Department (HOSPITAL_COMMUNITY): Payer: Medicare Other

## 2019-07-31 ENCOUNTER — Emergency Department (HOSPITAL_COMMUNITY)
Admission: EM | Admit: 2019-07-31 | Discharge: 2019-07-31 | Disposition: A | Discharge status: Home / Self Care | Payer: Medicare Other | Attending: Emergency Medicine | Admitting: Emergency Medicine

## 2019-07-31 ENCOUNTER — Other Ambulatory Visit: Payer: Self-pay

## 2019-07-31 ENCOUNTER — Encounter (HOSPITAL_COMMUNITY): Payer: Self-pay | Admitting: Emergency Medicine

## 2019-07-31 DIAGNOSIS — Z79899 Other long term (current) drug therapy: Secondary | ICD-10-CM | POA: Diagnosis not present

## 2019-07-31 DIAGNOSIS — R0789 Other chest pain: Secondary | ICD-10-CM | POA: Insufficient documentation

## 2019-07-31 DIAGNOSIS — R002 Palpitations: Secondary | ICD-10-CM | POA: Diagnosis present

## 2019-07-31 DIAGNOSIS — I480 Paroxysmal atrial fibrillation: Secondary | ICD-10-CM | POA: Diagnosis not present

## 2019-07-31 DIAGNOSIS — Z86711 Personal history of pulmonary embolism: Secondary | ICD-10-CM | POA: Insufficient documentation

## 2019-07-31 LAB — BASIC METABOLIC PANEL
Anion gap: 11 (ref 5–15)
BUN: 14 mg/dL (ref 6–20)
CO2: 22 mmol/L (ref 22–32)
Calcium: 9.6 mg/dL (ref 8.9–10.3)
Chloride: 107 mmol/L (ref 98–111)
Creatinine, Ser: 1.02 mg/dL (ref 0.61–1.24)
GFR calc Af Amer: >60 mL/min (ref >60)
GFR calc non Af Amer: >60 mL/min (ref >60)
Glucose, Bld: 109 mg/dL — ABNORMAL HIGH (ref 70–99)
Potassium: 4.1 mmol/L (ref 3.5–5.1)
Sodium: 140 mmol/L (ref 135–145)

## 2019-07-31 LAB — CBC
HCT: 44.9 % (ref 39.0–52.0)
Hemoglobin: 14.7 g/dL (ref 13.0–17.0)
MCH: 29.9 pg (ref 26.0–34.0)
MCHC: 32.7 g/dL (ref 30.0–36.0)
MCV: 91.3 fL (ref 80.0–100.0)
Platelets: 249 10*3/uL (ref 150–400)
RBC: 4.92 MIL/uL (ref 4.22–5.81)
RDW: 12.3 % (ref 11.5–15.5)
WBC: 4 10*3/uL (ref 4.0–10.5)
nRBC: 0 % (ref 0.0–0.2)

## 2019-07-31 LAB — MAGNESIUM: Magnesium: 1.7 mg/dL (ref 1.7–2.4)

## 2019-07-31 MED ORDER — RIVAROXABAN 20 MG PO TABS
20.0000 mg | ORAL_TABLET | Freq: Once | ORAL | Status: AC
  Administered 2019-07-31: 20 mg via ORAL
  Filled 2019-07-31: qty 1

## 2019-07-31 MED ORDER — RIVAROXABAN 20 MG PO TABS: 20.0000 mg | ORAL_TABLET | Freq: Every day | ORAL | 0 refills | Status: DC

## 2019-07-31 MED ORDER — RIVAROXABAN (XARELTO) EDUCATION KIT FOR AFIB PATIENTS
PACK | Freq: Once | Status: DC
  Filled 2019-07-31: qty 1

## 2019-07-31 NOTE — Discharge Instructions (Signed)
As discussed, it is very important that you follow-up with your cardiologist either at [facility], or at our [facility] clinic.  Please take all medication as directed, including the metoprolol you have been provided by your other physicians.  Return here for concerning changes in your condition.

## 2019-07-31 NOTE — ED Triage Notes (Signed)
Pt. C/o Chest palpitations after waking up today morning and laying on left side in bed. Denies any dizziness, shortness of breath, or chest pain. Pt. Relates palpitations started three months ago, relates feeling stressed at home, was evaluated by cardiologist three days ago and placed on halter monitor. Pt. Noted with A.Fib with RVR by EMS, 20mg  Cardizem IV given with 530mL NS PTA.
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
## 2019-07-31 NOTE — ED Notes (Signed)
Xarelto med needing dose verification prior to administering and D/C. EDP aware

## 2019-08-03 ENCOUNTER — Ambulatory Visit (HOSPITAL_COMMUNITY)
Admission: RE | Admit: 2019-08-03 | Discharge: 2019-08-03 | Disposition: A | Discharge status: Home / Self Care | Payer: Medicare Other | Source: Ambulatory Visit | Attending: Physician Assistant | Admitting: Physician Assistant

## 2019-08-03 ENCOUNTER — Encounter (HOSPITAL_COMMUNITY): Payer: Self-pay | Admitting: Physician Assistant

## 2019-08-03 ENCOUNTER — Other Ambulatory Visit: Payer: Self-pay

## 2019-08-03 VITALS — BP 106/68 | HR 56 | Ht 69.0 in | Wt 195.6 lb

## 2019-08-03 DIAGNOSIS — G4733 Obstructive sleep apnea (adult) (pediatric): Secondary | ICD-10-CM | POA: Diagnosis not present

## 2019-08-03 DIAGNOSIS — I480 Paroxysmal atrial fibrillation: Secondary | ICD-10-CM | POA: Diagnosis present

## 2019-08-03 DIAGNOSIS — E669 Obesity, unspecified: Secondary | ICD-10-CM | POA: Diagnosis not present

## 2019-08-03 DIAGNOSIS — K219 Gastro-esophageal reflux disease without esophagitis: Secondary | ICD-10-CM | POA: Insufficient documentation

## 2019-08-03 DIAGNOSIS — K589 Irritable bowel syndrome without diarrhea: Secondary | ICD-10-CM | POA: Diagnosis not present

## 2019-08-03 DIAGNOSIS — F419 Anxiety disorder, unspecified: Secondary | ICD-10-CM | POA: Diagnosis not present

## 2019-08-03 DIAGNOSIS — Z7901 Long term (current) use of anticoagulants: Secondary | ICD-10-CM | POA: Diagnosis not present

## 2019-08-03 DIAGNOSIS — Z86711 Personal history of pulmonary embolism: Secondary | ICD-10-CM | POA: Insufficient documentation

## 2019-08-03 DIAGNOSIS — Z79899 Other long term (current) drug therapy: Secondary | ICD-10-CM | POA: Insufficient documentation

## 2019-08-03 DIAGNOSIS — Z6828 Body mass index (BMI) 28.0-28.9, adult: Secondary | ICD-10-CM | POA: Diagnosis not present
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
## 2019-08-03 NOTE — Patient Instructions (Signed)
Stop Xarelto today.

## 2019-08-11 ENCOUNTER — Ambulatory Visit (HOSPITAL_COMMUNITY)
Admission: RE | Admit: 2019-08-11 | Discharge: 2019-08-11 | Disposition: A | Discharge status: Home / Self Care | Payer: Medicare Other | Source: Ambulatory Visit | Attending: Physician Assistant | Admitting: Physician Assistant

## 2019-08-11 ENCOUNTER — Encounter (HOSPITAL_COMMUNITY): Payer: Self-pay | Admitting: Physician Assistant

## 2019-08-11 ENCOUNTER — Other Ambulatory Visit: Payer: Self-pay

## 2019-08-11 VITALS — BP 110/66 | HR 53 | Ht 69.0 in | Wt 197.6 lb

## 2019-08-11 DIAGNOSIS — R001 Bradycardia, unspecified: Secondary | ICD-10-CM | POA: Insufficient documentation

## 2019-08-11 DIAGNOSIS — Z888 Allergy status to other drugs, medicaments and biological substances status: Secondary | ICD-10-CM | POA: Insufficient documentation

## 2019-08-11 DIAGNOSIS — I480 Paroxysmal atrial fibrillation: Secondary | ICD-10-CM | POA: Insufficient documentation

## 2019-08-11 DIAGNOSIS — Z86711 Personal history of pulmonary embolism: Secondary | ICD-10-CM | POA: Diagnosis not present

## 2019-08-11 DIAGNOSIS — Z8249 Family history of ischemic heart disease and other diseases of the circulatory system: Secondary | ICD-10-CM | POA: Insufficient documentation

## 2019-08-11 DIAGNOSIS — K219 Gastro-esophageal reflux disease without esophagitis: Secondary | ICD-10-CM | POA: Insufficient documentation

## 2019-08-11 DIAGNOSIS — G4733 Obstructive sleep apnea (adult) (pediatric): Secondary | ICD-10-CM | POA: Diagnosis not present

## 2019-08-11 DIAGNOSIS — F419 Anxiety disorder, unspecified: Secondary | ICD-10-CM | POA: Diagnosis not present

## 2019-08-11 DIAGNOSIS — Z79899 Other long term (current) drug therapy: Secondary | ICD-10-CM | POA: Insufficient documentation
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
## 2019-09-03 ENCOUNTER — Other Ambulatory Visit: Payer: Self-pay

## 2019-09-03 ENCOUNTER — Ambulatory Visit (HOSPITAL_COMMUNITY)
Admission: RE | Admit: 2019-09-03 | Discharge: 2019-09-03 | Disposition: A | Discharge status: Home / Self Care | Payer: Medicare Other | Source: Ambulatory Visit | Attending: Physician Assistant | Admitting: Physician Assistant

## 2019-09-03 ENCOUNTER — Encounter (HOSPITAL_COMMUNITY): Payer: Self-pay | Admitting: Physician Assistant

## 2019-09-03 VITALS — BP 104/74 | HR 57 | Ht 69.0 in | Wt 198.6 lb

## 2019-09-03 DIAGNOSIS — Z87442 Personal history of urinary calculi: Secondary | ICD-10-CM | POA: Diagnosis not present

## 2019-09-03 DIAGNOSIS — I480 Paroxysmal atrial fibrillation: Secondary | ICD-10-CM | POA: Insufficient documentation

## 2019-09-03 DIAGNOSIS — Z79899 Other long term (current) drug therapy: Secondary | ICD-10-CM | POA: Insufficient documentation

## 2019-09-03 DIAGNOSIS — F419 Anxiety disorder, unspecified: Secondary | ICD-10-CM | POA: Insufficient documentation

## 2019-09-03 DIAGNOSIS — Z7901 Long term (current) use of anticoagulants: Secondary | ICD-10-CM | POA: Insufficient documentation

## 2019-09-03 DIAGNOSIS — Z86711 Personal history of pulmonary embolism: Secondary | ICD-10-CM | POA: Diagnosis not present

## 2019-09-03 DIAGNOSIS — Z9119 Patient's noncompliance with other medical treatment and regimen: Secondary | ICD-10-CM | POA: Insufficient documentation

## 2019-09-03 DIAGNOSIS — Z803 Family history of malignant neoplasm of breast: Secondary | ICD-10-CM | POA: Insufficient documentation

## 2019-09-03 DIAGNOSIS — G4733 Obstructive sleep apnea (adult) (pediatric): Secondary | ICD-10-CM | POA: Insufficient documentation

## 2019-09-03 DIAGNOSIS — Z833 Family history of diabetes mellitus: Secondary | ICD-10-CM | POA: Insufficient documentation

## 2019-09-03 DIAGNOSIS — K219 Gastro-esophageal reflux disease without esophagitis: Secondary | ICD-10-CM | POA: Insufficient documentation
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
## 2019-09-10 ENCOUNTER — Other Ambulatory Visit: Payer: Self-pay

## 2019-09-10 ENCOUNTER — Ambulatory Visit (INDEPENDENT_AMBULATORY_CARE_PROVIDER_SITE_OTHER): Payer: Medicare Other | Admitting: Cardiology

## 2019-09-10 ENCOUNTER — Encounter: Payer: Self-pay | Admitting: Cardiology

## 2019-09-10 VITALS — BP 88/56 | HR 64 | Ht 69.0 in | Wt 199.0 lb

## 2019-09-10 DIAGNOSIS — I480 Paroxysmal atrial fibrillation: Secondary | ICD-10-CM

## 2019-09-10 NOTE — Patient Instructions (Signed)
Medication Instructions:  Your physician recommends that you continue on your current medications as directed. Please refer to the Current Medication list given to you today.  *If you need a refill on your cardiac medications before your next appointment, please call your pharmacy*   Lab Work: None ordered If you have labs (blood work) drawn today and your tests are completely normal, you will receive your results only by: [name] MyChart Message (if you have MyChart) OR . A paper copy in the mail If you have any lab test that is abnormal or we need to change your treatment, we will call you to review the results.   Testing/Procedures: None ordered   Follow-Up: At [facility], you and your health needs are our priority.  As part of our continuing mission to provide you with exceptional heart care, we have created designated Provider Care Teams.  These Care Teams include your primary Cardiologist (physician) and Advanced Practice Providers (APPs -  Physician Assistants and Nurse Practitioners) who all work together to provide you with the care you need, when you need it.  We recommend signing up for the patient portal called "MyChart".  Sign up information is provided on this After Visit Summary.  MyChart is used to connect with patients for Virtual Visits (Telemedicine).  Patients are able to view lab/test results, encounter notes, upcoming appointments, etc.  Non-urgent messages can be sent to your provider as well.   To learn more about what you can do with MyChart, go to [email or web address].    Your next appointment:   6 month(s)  The format for your next appointment:   In Person  Provider:   Allegra Lai, MD   Thank you for choosing [facility]!!   Trinidad Curet, RN [phone]    Other Instructions   Cardiac Ablation Cardiac ablation is a procedure to disable (ablate) a small amount of heart tissue in very specific places. The heart has many electrical  connections. Sometimes these connections are abnormal and can cause the heart to beat very fast or irregularly. Ablating some of the problem areas can improve the heart rhythm or return it to normal. Ablation may be done for people who:  Have Wolff-Parkinson-White syndrome.  Have fast heart rhythms (tachycardia).  Have taken medicines for an abnormal heart rhythm (arrhythmia) that were not effective or caused side effects.  Have a high-risk heartbeat that may be life-threatening. During the procedure, a small incision is made in the neck or the groin, and a long, thin, flexible tube (catheter) is inserted into the incision and moved to the heart. Small devices (electrodes) on the tip of the catheter will send out electrical currents. A type of X-ray (fluoroscopy) will be used to help guide the catheter and to provide images of the heart. Tell a health care provider about:  Any allergies you have.  All medicines you are taking, including vitamins, herbs, eye drops, creams, and over-the-counter medicines.  Any problems you or family members have had with anesthetic medicines.  Any blood disorders you have.  Any surgeries you have had.  Any medical conditions you have, such as kidney failure.  Whether you are pregnant or may be pregnant. What are the risks? Generally, this is a safe procedure. However, problems may occur, including:  Infection.  Bruising and bleeding at the catheter insertion site.  Bleeding into the chest, especially into the sac that surrounds the heart. This is a serious complication.  Stroke or blood clots.  Damage to other  Damage to other structures or organs.  Allergic reaction to medicines or dyes.  Need for a permanent pacemaker if the normal electrical system is damaged. A pacemaker is a small computer that sends electrical signals to the heart and helps your heart beat normally.  The procedure not being fully effective. This may not be recognized until months later.  Repeat ablation procedures are sometimes required. What happens before the procedure?  Follow instructions from your health care provider about eating or drinking restrictions.  Ask your health care provider about: ? Changing or stopping your regular medicines. This is especially important if you are taking diabetes medicines or blood thinners. ? Taking medicines such as aspirin and ibuprofen. These medicines can thin your blood. Do not take these medicines before your procedure if your health care provider instructs you not to.  Plan to have someone take you home from the hospital or clinic.  If you will be going home right after the procedure, plan to have someone with you for 24 hours. What happens during the procedure?  To lower your risk of infection: ? Your health care team will wash or sanitize their hands. ? Your skin will be washed with soap. ? Hair may be removed from the incision area.  An IV tube will be inserted into one of your veins.  You will be given a medicine to help you relax (sedative).  The skin on your neck or groin will be numbed.  An incision will be made in your neck or your groin.  A needle will be inserted through the incision and into a large vein in your neck or groin.  A catheter will be inserted into the needle and moved to your heart.  Dye may be injected through the catheter to help your surgeon see the area of the heart that needs treatment.  Electrical currents will be sent from the catheter to ablate heart tissue in desired areas. There are three types of energy that may be used to ablate heart tissue: ? Heat (radiofrequency energy). ? Laser energy. ? Extreme cold (cryoablation).  When the necessary tissue has been ablated, the catheter will be removed.  Pressure will be held on the catheter insertion area to prevent excessive bleeding.  A bandage (dressing) will be placed over the catheter insertion area. The procedure may vary among  health care providers and hospitals. What happens after the procedure?  Your blood pressure, heart rate, breathing rate, and blood oxygen level will be monitored until the medicines you were given have worn off.  Your catheter insertion area will be monitored for bleeding. You will need to lie still for a few hours to ensure that you do not bleed from the catheter insertion area.  Do not drive for 24 hours or as long as directed by your health care provider. Summary  Cardiac ablation is a procedure to disable (ablate) a small amount of heart tissue in very specific places. Ablating some of the problem areas can improve the heart rhythm or return it to normal.  During the procedure, electrical currents will be sent from the catheter to ablate heart tissue in desired areas. This information is not intended to replace advice given to you by your health care provider. Make sure you discuss any questions you have with your health care provider. Document Revised: 11/04/2017 Document Reviewed: 04/02/2016 Elsevier Patient Education  2020 Elsevier Inc.    
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
## 2019-10-20 ENCOUNTER — Ambulatory Visit (HOSPITAL_COMMUNITY): Payer: Medicare Other | Admitting: Physician Assistant
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
## 2019-10-29 ENCOUNTER — Encounter (HOSPITAL_COMMUNITY): Payer: Self-pay | Admitting: Physician Assistant

## 2019-10-29 ENCOUNTER — Ambulatory Visit (HOSPITAL_COMMUNITY)
Admission: RE | Admit: 2019-10-29 | Discharge: 2019-10-29 | Disposition: A | Discharge status: Home / Self Care | Payer: Medicare Other | Source: Ambulatory Visit | Attending: Physician Assistant | Admitting: Physician Assistant

## 2019-10-29 ENCOUNTER — Other Ambulatory Visit: Payer: Self-pay

## 2019-10-29 VITALS — BP 122/80 | HR 54 | Ht 69.0 in | Wt 208.4 lb

## 2019-10-29 DIAGNOSIS — G4733 Obstructive sleep apnea (adult) (pediatric): Secondary | ICD-10-CM | POA: Diagnosis not present

## 2019-10-29 DIAGNOSIS — Z881 Allergy status to other antibiotic agents status: Secondary | ICD-10-CM | POA: Diagnosis not present

## 2019-10-29 DIAGNOSIS — E669 Obesity, unspecified: Secondary | ICD-10-CM | POA: Diagnosis not present

## 2019-10-29 DIAGNOSIS — Z888 Allergy status to other drugs, medicaments and biological substances status: Secondary | ICD-10-CM | POA: Insufficient documentation

## 2019-10-29 DIAGNOSIS — Z6830 Body mass index (BMI) 30.0-30.9, adult: Secondary | ICD-10-CM | POA: Insufficient documentation

## 2019-10-29 DIAGNOSIS — I440 Atrioventricular block, first degree: Secondary | ICD-10-CM | POA: Diagnosis not present

## 2019-10-29 DIAGNOSIS — Z79899 Other long term (current) drug therapy: Secondary | ICD-10-CM | POA: Diagnosis not present

## 2019-10-29 DIAGNOSIS — Z9989 Dependence on other enabling machines and devices: Secondary | ICD-10-CM | POA: Diagnosis not present

## 2019-10-29 DIAGNOSIS — F419 Anxiety disorder, unspecified: Secondary | ICD-10-CM | POA: Diagnosis not present

## 2019-10-29 DIAGNOSIS — Z87891 Personal history of nicotine dependence: Secondary | ICD-10-CM | POA: Insufficient documentation

## 2019-10-29 DIAGNOSIS — I480 Paroxysmal atrial fibrillation: Secondary | ICD-10-CM | POA: Diagnosis not present

## 2019-11-22 ENCOUNTER — Other Ambulatory Visit: Payer: Self-pay | Admitting: Gastroenterology

## 2019-12-06 ENCOUNTER — Emergency Department (HOSPITAL_BASED_OUTPATIENT_CLINIC_OR_DEPARTMENT_OTHER)
Admission: EM | Admit: 2019-12-06 | Discharge: 2019-12-06 | Disposition: A | Discharge status: Home / Self Care | Payer: Medicare Other | Attending: Emergency Medicine | Admitting: Emergency Medicine

## 2019-12-06 ENCOUNTER — Other Ambulatory Visit: Payer: Self-pay

## 2019-12-06 ENCOUNTER — Emergency Department (HOSPITAL_BASED_OUTPATIENT_CLINIC_OR_DEPARTMENT_OTHER): Payer: Medicare Other

## 2019-12-06 ENCOUNTER — Encounter (HOSPITAL_BASED_OUTPATIENT_CLINIC_OR_DEPARTMENT_OTHER): Payer: Self-pay | Admitting: Emergency Medicine

## 2019-12-06 DIAGNOSIS — I4891 Unspecified atrial fibrillation: Secondary | ICD-10-CM | POA: Diagnosis present

## 2019-12-06 DIAGNOSIS — I480 Paroxysmal atrial fibrillation: Secondary | ICD-10-CM | POA: Diagnosis not present

## 2019-12-06 LAB — TROPONIN I (HIGH SENSITIVITY)
Troponin I (High Sensitivity): 4 ng/L (ref <18)
Troponin I (High Sensitivity): 6 ng/L (ref <18)

## 2019-12-06 LAB — CBC
HCT: 43.4 % (ref 39.0–52.0)
Hemoglobin: 14.6 g/dL (ref 13.0–17.0)
MCH: 30.1 pg (ref 26.0–34.0)
MCHC: 33.6 g/dL (ref 30.0–36.0)
MCV: 89.5 fL (ref 80.0–100.0)
Platelets: 272 10*3/uL (ref 150–400)
RBC: 4.85 MIL/uL (ref 4.22–5.81)
RDW: 12.5 % (ref 11.5–15.5)
WBC: 7.9 10*3/uL (ref 4.0–10.5)
nRBC: 0 % (ref 0.0–0.2)

## 2019-12-06 LAB — BASIC METABOLIC PANEL
Anion gap: 9 (ref 5–15)
BUN: 21 mg/dL — ABNORMAL HIGH (ref 6–20)
CO2: 26 mmol/L (ref 22–32)
Calcium: 9.3 mg/dL (ref 8.9–10.3)
Chloride: 104 mmol/L (ref 98–111)
Creatinine, Ser: 1.5 mg/dL — ABNORMAL HIGH (ref 0.61–1.24)
GFR calc Af Amer: >60 mL/min (ref >60)
GFR calc non Af Amer: 55 mL/min — ABNORMAL LOW (ref >60)
Glucose, Bld: 108 mg/dL — ABNORMAL HIGH (ref 70–99)
Potassium: 4.2 mmol/L (ref 3.5–5.1)
Sodium: 139 mmol/L (ref 135–145)

## 2019-12-06 MED ORDER — DILTIAZEM HCL-DEXTROSE 125-5 MG/125ML-% IV SOLN (PREMIX)
5.0000 mg/h | INTRAVENOUS | Status: DC
  Administered 2019-12-06: 5 mg/h via INTRAVENOUS
  Filled 2019-12-06: qty 125

## 2019-12-06 MED ORDER — SODIUM CHLORIDE 0.9% FLUSH
3.0000 mL | Freq: Once | INTRAVENOUS | Status: DC
  Filled 2019-12-06: qty 3
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
## 2019-12-06 NOTE — Discharge Instructions (Signed)
Take 1-1/2 metoprolol tablets twice a day.  You may take an additional tablet as needed for increased heart rate.  Call the atrial fibrillation clinic tomorrow for follow-up.  Take a full-strength aspirin every day until follow-up.

## 2019-12-06 NOTE — ED Notes (Signed)
Pt declines XRAY at this time

## 2019-12-06 NOTE — ED Triage Notes (Signed)
Reports feeling like he went into afib about 30 min pta.  Hx of the same.  Take metoprolol daily.  Last dose at 2pm.  Endorses chest feeling tight.

## 2019-12-08 ENCOUNTER — Other Ambulatory Visit: Payer: Self-pay

## 2019-12-08 ENCOUNTER — Encounter (HOSPITAL_COMMUNITY): Payer: Self-pay | Admitting: Physician Assistant

## 2019-12-08 ENCOUNTER — Ambulatory Visit (HOSPITAL_COMMUNITY)
Admission: RE | Admit: 2019-12-08 | Discharge: 2019-12-08 | Disposition: A | Discharge status: Home / Self Care | Payer: Medicare Other | Source: Ambulatory Visit | Attending: Physician Assistant | Admitting: Physician Assistant

## 2019-12-08 VITALS — BP 112/70 | HR 59 | Ht 69.0 in | Wt 209.4 lb

## 2019-12-08 DIAGNOSIS — I480 Paroxysmal atrial fibrillation: Secondary | ICD-10-CM

## 2019-12-08 DIAGNOSIS — G4733 Obstructive sleep apnea (adult) (pediatric): Secondary | ICD-10-CM | POA: Insufficient documentation

## 2019-12-08 DIAGNOSIS — Z86711 Personal history of pulmonary embolism: Secondary | ICD-10-CM | POA: Diagnosis not present

## 2019-12-08 DIAGNOSIS — K589 Irritable bowel syndrome without diarrhea: Secondary | ICD-10-CM | POA: Diagnosis not present

## 2019-12-08 DIAGNOSIS — Z87891 Personal history of nicotine dependence: Secondary | ICD-10-CM | POA: Diagnosis not present

## 2019-12-08 DIAGNOSIS — Z79899 Other long term (current) drug therapy: Secondary | ICD-10-CM | POA: Insufficient documentation

## 2019-12-08 DIAGNOSIS — K5790 Diverticulosis of intestine, part unspecified, without perforation or abscess without bleeding: Secondary | ICD-10-CM | POA: Diagnosis not present

## 2019-12-08 DIAGNOSIS — K219 Gastro-esophageal reflux disease without esophagitis: Secondary | ICD-10-CM | POA: Diagnosis not present
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
## 2019-12-14 ENCOUNTER — Telehealth: Payer: Self-pay | Admitting: Cardiology

## 2019-12-14 NOTE — Telephone Encounter (Signed)
Returned call to patient regarding symptoms. These symptoms were prior to his last AF clinic appointment, not new symptoms. He just called to make Dr Charmaine Downs aware. F/u with Dr Curt Bears to discuss ablation.

## 2019-12-14 NOTE — Telephone Encounter (Signed)
Patient c/o Palpitations:  High priority if patient c/o lightheadedness, shortness of breath, or chest pain  1) How long have you had palpitations/irregular HR/ Afib? Are you having the symptoms now? Had afib last Saturday - no symptoms right now  2) Are you currently experiencing lightheadedness, SOB or CP? no  3) Do you have a history of afib (atrial fibrillation) or irregular heart rhythm? Yes afib  4) Have you checked your BP or HR? (document readings if available): no  5) Are you experiencing any other symptoms? Has heart flutters and dizzy/tingling spells. States he does get some fatigue after these spells.

## 2019-12-15 NOTE — Telephone Encounter (Signed)
Pt aware that there is not a sooner appt. Advised to contact AFib clinic and they will help advise until he can be seen by EP MD. Aware I will forward this to our EP scheduler for her to add him to wait list incase appt comes open sooner w/ [place]. Pt agreeable to plan.

## 2019-12-15 NOTE — Telephone Encounter (Signed)
Follow up    [name] is following up on his call yesterday. He is wanting to know Dr. Curt Bears thoughts and if he needs to be seen sooner. He feels that his symptoms are becoming worse.

## 2020-01-19 ENCOUNTER — Ambulatory Visit (INDEPENDENT_AMBULATORY_CARE_PROVIDER_SITE_OTHER): Payer: Medicare Other | Admitting: Cardiology

## 2020-01-19 ENCOUNTER — Other Ambulatory Visit: Payer: Self-pay

## 2020-01-19 ENCOUNTER — Encounter: Payer: Self-pay | Admitting: Cardiology

## 2020-01-19 VITALS — BP 110/78 | HR 72 | Ht 69.0 in | Wt 215.4 lb

## 2020-01-19 DIAGNOSIS — I4819 Other persistent atrial fibrillation: Secondary | ICD-10-CM | POA: Diagnosis not present

## 2020-01-19 DIAGNOSIS — Z01812 Encounter for preprocedural laboratory examination: Secondary | ICD-10-CM | POA: Diagnosis not present

## 2020-01-19 DIAGNOSIS — I480 Paroxysmal atrial fibrillation: Secondary | ICD-10-CM | POA: Diagnosis not present

## 2020-01-19 NOTE — Patient Instructions (Addendum)
Medication Instructions:  Your physician has recommended you make the following change in your medication:  1. START Xarelto 20 mg once daily at supper time.  START THIS MEDICATION ON 02/01/2020  *If you need a refill on your cardiac medications before your next appointment, please call your pharmacy*   Lab Work: Pre procedure labs: 02/16/2020, You do NOT need to be fasting. If you have labs (blood work) drawn today and your tests are completely normal, you will receive your results only by: [name] MyChart Message (if you have MyChart) OR . A paper copy in the mail If you have any lab test that is abnormal or we need to change your treatment, we will call you to review the results.   Testing/Procedures: Your physician has requested that you have cardiac CT within 7 days PRIOR to your ablation. Cardiac computed tomography (CT) is a painless test that uses an x-ray machine to take clear, detailed pictures of your heart. . Please see the instructions below located under "other instructions".    Your physician has recommended that you have an ablation. Catheter ablation is a medical procedure used to treat some cardiac arrhythmias (irregular heartbeats). During catheter ablation, a long, thin, flexible tube is put into a blood vessel in your groin (upper thigh), or neck. This tube is called an ablation catheter. It is then guided to your heart through the blood vessel. Radio frequency waves destroy small areas of heart tissue where abnormal heartbeats may cause an arrhythmia to start. Please see the instructions below located under "other instructions".    Follow-Up: At [facility], you and your health needs are our priority.  As part of our continuing mission to provide you with exceptional heart care, we have created designated Provider Care Teams.  These Care Teams include your primary Cardiologist (physician) and Advanced Practice Providers (APPs -  Physician Assistants and Nurse Practitioners)  who all work together to provide you with the care you need, when you need it.  We recommend signing up for the patient portal called "MyChart".  Sign up information is provided on this After Visit Summary.  MyChart is used to connect with patients for Virtual Visits (Telemedicine).  Patients are able to view lab/test results, encounter notes, upcoming appointments, etc.  Non-urgent messages can be sent to your provider as well.   To learn more about what you can do with MyChart, go to [email or web address].    Your next appointment:   1 month(s) after your ablation  The format for your next appointment:   In Person  Provider:   Allegra Lai, MD   Thank you for choosing [facility]!!   Trinidad Curet, RN [phone]    Other Instructions  Your cardiac CT will be scheduled at:  [facility] [address] [place], [place] [address] [phone]  Please arrive at the [facility] main entrance of [facility] on _________________ @ _______________, please arrive 30 minutes prior to test start time. Proceed to the [facility] Radiology Department (first floor) to check-in and test prep.   Please follow these instructions carefully (unless otherwise directed):  Hold all erectile dysfunction medications at least 3 days (72 hrs) prior to test.  On the Night Before the Test: . Be sure to Drink plenty of water. . Do not consume any caffeinated/decaffeinated beverages or chocolate 12 hours prior to your test. . Do not take any antihistamines 12 hours prior to your test.  On the Day of the Test: .  Drink plenty of water. Do not drink any water within one hour of the test. . Do not eat any food 4 hours prior to the test. . You may take your regular medications prior to the test.  . Take your Toprol the morning of this testing . HOLD Furosemide/Hydrochlorothiazide morning of the test.       After the Test: . Drink plenty of water. . After receiving IV  contrast, you may experience a mild flushed feeling. This is normal. . On occasion, you may experience a mild rash up to 24 hours after the test. This is not dangerous. If this occurs, you can take Benadryl 25 mg and increase your fluid intake. . If you experience trouble breathing, this can be serious. If it is severe call 911 IMMEDIATELY. If it is mild, please call our office. . If you take any of these medications: Glipizide/Metformin, Avandament, Glucavance, please do not take 48 hours after completing test unless otherwise instructed.   Once we have confirmed authorization from your insurance company, we will call you to set up a date and time for your test. Based on how quickly your insurance processes prior authorizations requests, please allow up to 4 weeks to be contacted for scheduling your Cardiac CT appointment. Be advised that routine Cardiac CT appointments could be scheduled as many as 8 weeks after your provider has ordered it.  For non-scheduling related questions, please contact the cardiac imaging nurse navigator should you have any questions/concerns: Marchia Bond, Cardiac Imaging Nurse Navigator Burley Saver, Interim Cardiac Imaging Nurse [facility] Direct Office Dial: [phone]   For scheduling needs, including cancellations and rescheduling, please call Vivien Rota at [phone], option 3.      Electrophysiology/Ablation Procedure Instructions   You are scheduled for a(n)  ablation on 02/26/2020 with Dr. Allegra Lai.   1.   Pre procedure testing-             A.  LAB WORK --- On 02/16/2020  for your pre procedure blood work.  You may stop by the office anytime between 7:30 am - 4:30 pm.  You DO NOT need to be fasting.               B. COVID TEST-- On 02/24/2020 @ 8:00 am -  This is a Drive Up Visit at [address] [place], [place] [address] Someone will direct you to the appropriate testing line. Stay in your car and someone will  be with you shortly.        2. On the day of your procedure 02/26/2020 you will go to [facility] [phone]) at 5:30 am.  Dennis Bast will go to the main entrance A [employer]) and enter where the [employer] are.  Your driver will drop you off and you will head down the hallway to ADMITTING.  You may have one support person come in to the hospital with you.  They will be asked to wait in the waiting room.   3.   Do not eat or drink after midnight prior to your procedure.   4.   Do NOT take any medications the morning of your procedure.   5.  Plan for an overnight stay.  If you use your phone frequently bring your phone charger.   6. You will follow up with the AFIB clinic 4 weeks after your procedure.  You will follow up with Dr. Curt Bears  3 months after  your procedure.  These appointments will be made for you.   * If you have ANY questions please call the office [phone] and ask for Kjersti Dittmer RN or send me a MyChart message   * Occasionally, EP Studies and ablations can become lengthy.  Please make your family aware of this before your procedure starts.  Average time ranges from 2-8 hours for EP studies/ablations.  Your physician will call your family after the procedure with the results.                                    Cardiac Ablation Cardiac ablation is a procedure to disable (ablate) a small amount of heart tissue in very specific places. The heart has many electrical connections. Sometimes these connections are abnormal and can cause the heart to beat very fast or irregularly. Ablating some of the problem areas can improve the heart rhythm or return it to normal. Ablation may be done for people who:  Have Wolff-Parkinson-White syndrome.  Have fast heart rhythms (tachycardia).  Have taken medicines for an abnormal heart rhythm (arrhythmia) that were not effective or caused side effects.  Have a high-risk heartbeat that may be life-threatening. During the  procedure, a small incision is made in the neck or the groin, and a long, thin, flexible tube (catheter) is inserted into the incision and moved to the heart. Small devices (electrodes) on the tip of the catheter will send out electrical currents. A type of X-ray (fluoroscopy) will be used to help guide the catheter and to provide images of the heart. Tell a health care provider about:  Any allergies you have.  All medicines you are taking, including vitamins, herbs, eye drops, creams, and over-the-counter medicines.  Any problems you or family members have had with anesthetic medicines.  Any blood disorders you have.  Any surgeries you have had.  Any medical conditions you have, such as kidney failure.  Whether you are pregnant or may be pregnant. What are the risks? Generally, this is a safe procedure. However, problems may occur, including:  Infection.  Bruising and bleeding at the catheter insertion site.  Bleeding into the chest, especially into the sac that surrounds the heart. This is a serious complication.  Stroke or blood clots.  Damage to other structures or organs.  Allergic reaction to medicines or dyes.  Need for a permanent pacemaker if the normal electrical system is damaged. A pacemaker is a small computer that sends electrical signals to the heart and helps your heart beat normally.  The procedure not being fully effective. This may not be recognized until months later. Repeat ablation procedures are sometimes required. What happens before the procedure?  Follow instructions from your health care provider about eating or drinking restrictions.  Ask your health care provider about: ? Changing or stopping your regular medicines. This is especially important if you are taking diabetes medicines or blood thinners. ? Taking medicines such as aspirin and ibuprofen. These medicines can thin your blood. Do not take these medicines before your procedure if your health  care provider instructs you not to.  Plan to have someone take you home from the hospital or clinic.  If you will be going home right after the procedure, plan to have someone with you for 24 hours. What happens during the procedure?  To lower your risk of infection: ? Your health care team will wash or sanitize  their hands. ? Your skin will be washed with soap. ? Hair may be removed from the incision area.  An IV tube will be inserted into one of your veins.  You will be given a medicine to help you relax (sedative).  The skin on your neck or groin will be numbed.  An incision will be made in your neck or your groin.  A needle will be inserted through the incision and into a large vein in your neck or groin.  A catheter will be inserted into the needle and moved to your heart.  Dye may be injected through the catheter to help your surgeon see the area of the heart that needs treatment.  Electrical currents will be sent from the catheter to ablate heart tissue in desired areas. There are three types of energy that may be used to ablate heart tissue: ? Heat (radiofrequency energy). ? Laser energy. ? Extreme cold (cryoablation).  When the necessary tissue has been ablated, the catheter will be removed.  Pressure will be held on the catheter insertion area to prevent excessive bleeding.  A bandage (dressing) will be placed over the catheter insertion area. The procedure may vary among health care providers and hospitals. What happens after the procedure?  Your blood pressure, heart rate, breathing rate, and blood oxygen level will be monitored until the medicines you were given have worn off.  Your catheter insertion area will be monitored for bleeding. You will need to lie still for a few hours to ensure that you do not bleed from the catheter insertion area.  Do not drive for 24 hours or as long as directed by your health care provider. Summary  Cardiac ablation is a  procedure to disable (ablate) a small amount of heart tissue in very specific places. Ablating some of the problem areas can improve the heart rhythm or return it to normal.  During the procedure, electrical currents will be sent from the catheter to ablate heart tissue in desired areas. This information is not intended to replace advice given to you by your health care provider. Make sure you discuss any questions you have with your health care provider. Document Revised: 11/04/2017 Document Reviewed: 04/02/2016 Elsevier Patient Education  [place].
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
## 2020-02-05 ENCOUNTER — Telehealth: Payer: Self-pay | Admitting: Cardiology

## 2020-02-05 NOTE — Telephone Encounter (Signed)
Patient is requesting to reschedule ablation with Dr. Curt Bears. He states he does not want to have a COVID test at this time.

## 2020-02-08 NOTE — Telephone Encounter (Signed)
Pt would like to hold off on ablation at this time.  He does not want to take a risk w/ the Covid Delta surge and he only experiences issues every couple of months. Pt aware I would discuss w/ Camnitz and let him know the recommendation Pt agreeable to plan

## 2020-02-08 NOTE — Telephone Encounter (Signed)
Pt aware Dr. Curt Bears agreeable to holding off on ablation at this time if pt so prefers. Pt advised to keep follow up in December and to call office if he needs anything between now and then. Patient verbalized understanding and agreeable to plan.

## 2020-02-16 ENCOUNTER — Other Ambulatory Visit: Payer: Medicare Other

## 2020-02-21 ENCOUNTER — Other Ambulatory Visit: Payer: Self-pay | Admitting: Gastroenterology

## 2020-02-23 ENCOUNTER — Ambulatory Visit (HOSPITAL_COMMUNITY): Payer: Medicare Other

## 2020-02-24 ENCOUNTER — Other Ambulatory Visit (HOSPITAL_COMMUNITY): Payer: Medicare Other

## 2020-02-26 ENCOUNTER — Ambulatory Visit (HOSPITAL_COMMUNITY): Admission: RE | Admit: 2020-02-26 | Payer: Medicare Other | Source: Home / Self Care | Admitting: Cardiology

## 2020-02-26 ENCOUNTER — Encounter (HOSPITAL_COMMUNITY): Admission: RE | Payer: Self-pay | Source: Home / Self Care

## 2020-02-26 SURGERY — ATRIAL FIBRILLATION ABLATION
Anesthesia: General

## 2020-03-25 ENCOUNTER — Ambulatory Visit (HOSPITAL_COMMUNITY): Payer: Medicare Other | Admitting: Physician Assistant

## 2020-04-01 ENCOUNTER — Telehealth: Payer: Self-pay | Admitting: Cardiology

## 2020-04-01 NOTE — Telephone Encounter (Signed)
Patient calling in regards to procedure he cancelled on 02/26/2020. The order is no longer in the system and I cannot reschedule from the cancelled procedure. Patient would like to know if he still needs it done. Please advise.   Thank you!

## 2020-04-06 NOTE — Telephone Encounter (Signed)
Left message to call back  

## 2020-04-08 NOTE — Telephone Encounter (Signed)
Pt is ready to reschedule ablation. Pt scheduled to see Camnitz, to re-discuss, 11/30. Ablation spot held for 12/29. Patient verbalized understanding and agreeable to plan.

## 2020-04-08 NOTE — Telephone Encounter (Signed)
Pt returning call

## 2020-04-26 ENCOUNTER — Other Ambulatory Visit: Payer: Self-pay

## 2020-04-26 ENCOUNTER — Encounter: Payer: Self-pay | Admitting: Cardiology

## 2020-04-26 ENCOUNTER — Ambulatory Visit (INDEPENDENT_AMBULATORY_CARE_PROVIDER_SITE_OTHER): Payer: Medicare Other | Admitting: Cardiology

## 2020-04-26 VITALS — BP 118/90 | HR 74 | Ht 69.0 in | Wt 222.0 lb

## 2020-04-26 DIAGNOSIS — Z01812 Encounter for preprocedural laboratory examination: Secondary | ICD-10-CM | POA: Diagnosis not present

## 2020-04-26 DIAGNOSIS — I480 Paroxysmal atrial fibrillation: Secondary | ICD-10-CM

## 2020-04-26 MED ORDER — RIVAROXABAN 20 MG PO TABS
20.0000 mg | ORAL_TABLET | Freq: Every day | ORAL | 4 refills | Status: DC
Start: 2020-04-26 — End: 2020-09-27
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
## 2020-04-26 NOTE — Patient Instructions (Addendum)
Medication Instructions:  Your physician has recommended you make the following change in your medication:  1. START Xarelto 20 mg once daily -- start this on 12/10  *If you need a refill on your cardiac medications before your next appointment, please call your pharmacy*   Lab Work: Pre procedure blood work between 05/09/20 - 05/25/21.  You can stop by the [facility] street office anytime between 7:30 am - 4:30 pm.  You do NOT need to be fasting.  Please send a mychart message with date you will be stopping by so that we can schedule the lab appointment.  If you have labs (blood work) drawn today and your tests are completely normal, you will receive your results only by: [name] MyChart Message (if you have MyChart) OR . A paper copy in the mail If you have any lab test that is abnormal or we need to change your treatment, we will call you to review the results.   Testing/Procedures:    Follow-Up: At [facility], you and your health needs are our priority.  As part of our continuing mission to provide you with exceptional heart care, we have created designated Provider Care Teams.  These Care Teams include your primary Cardiologist (physician) and Advanced Practice Providers (APPs -  Physician Assistants and Nurse Practitioners) who all work together to provide you with the care you need, when you need it.  Your next appointment:   4 week(s) after your ablation on 06/03/2020  The format for your next appointment:   In Person  Provider:   You will follow up in the [facility] located at [facility]. Your provider will be: Roderic Palau, NP or Clint R. Fenton, PA-C    Thank you for choosing [facility]!!   Trinidad Curet, RN [phone]   Other Instructions  CT INSTRUCTIONS Your cardiac CT will be scheduled at:  [facility] [address] [place], [place] [address] [phone]  Please arrive at the [facility] main entrance of  [facility] 30 minutes prior to test start time. Proceed to the [facility] Radiology Department (first floor) to check-in and test prep.  Please follow these instructions carefully (unless otherwise directed):  Hold all erectile dysfunction medications at least 3 days (72 hrs) prior to test.  On the Night Before the Test:  Be sure to Drink plenty of water.  Do not consume any caffeinated/decaffeinated beverages or chocolate 12 hours prior to your test.  Do not take any antihistamines 12 hours prior to your test.  On the Day of the Test:  Drink plenty of water. Do not drink any water within one hour of the test.  Do not eat any food 4 hours prior to the test.  You may take your regular medications prior to the test.   Take your Toprol 25 mg two hours prior to test.  HOLD Furosemide/Hydrochlorothiazide morning of the test.      After the Test:  Drink plenty of water.  After receiving IV contrast, you may experience a mild flushed feeling. This is normal.  On occasion, you may experience a mild rash up to 24 hours after the test. This is not dangerous. If this occurs, you can take Benadryl 25 mg and increase your fluid intake.  If you experience trouble breathing, this can be serious. If it is severe call 911 IMMEDIATELY. If it is mild, please call our office.  If you take any of these medications: Glipizide/Metformin, Avandament,  Glucavance, please do not take 48 hours after completing test unless otherwise instructed.   Once we have confirmed authorization from your insurance company, we will call you to set up a date and time for your test. Based on how quickly your insurance processes prior authorizations requests, please allow up to 4 weeks to be contacted for scheduling your Cardiac CT appointment. Be advised that routine Cardiac CT appointments could be scheduled as many as 8 weeks after your provider has ordered it.  For non-scheduling related questions,  please contact the cardiac imaging nurse navigator should you have any questions/concerns: Marchia Bond, Cardiac Imaging Nurse Navigator Burley Saver, Interim Cardiac Imaging Nurse [place] [facility] Direct Office Dial: [phone]   For scheduling needs, including cancellations and rescheduling, please call [place], [phone] (temporary number).     Electrophysiology/Ablation Procedure Instructions  You are scheduled for a(n) repeat ablationon 06/03/2020 with Dr.Will Camnitz.  1. Pre procedure testing- A. LAB WORK ---  Pre procedure blood work between 05/09/20 - 05/25/21.  You can stop by the [facility] street office anytime between 7:30 am - 4:30 pm.  You do NOT need to be fasting.  Please send a mychart message with date you will be stopping by so that we can schedule the lab appointment.  B. COVID TEST-- On 05/31/2020 @ 12:00 pm  - This is a Drive Up Visit at [address] [place], [place] [address].  Someone will direct you to the appropriate testing line. Stay in your car and someone will be with you shortly.  After you are tested please go home and self quarantine until the day of your procedure.   2. On the day of your procedure 06/03/2020 you will go to [facility] [phone]) at 5:30 am . Dennis Bast will go to the main entrance A [employer]) and enter where the [employer] are. Your driver will drop you off and you will head down the hallway to ADMITTING. You may have one support person come in to the hospital with you.  They will be asked to wait in the waiting room.  3. Do not eat or drink after midnight prior to your procedure.  4. Do not miss any doses of your blood thinner prior to the morning of your procedure or your procedure will need to be rescheduled.       Do NOT take any medications the morning of your procedure.  5. Plan for an overnight stay, but you may be discharged  home after your procedure.   If you use your phone frequently bring your phone charger, in case you have to stay.  If you are discharged after your procedure you will need someone to drive you home and be with your for 24 hours after your procedure.  6. You will follow up with the AFIB clinic 4 weeksafter your procedure. You will follow up with Dr. Curt Bears  3 monthsafter your procedure. These appointments will be made for you.  * If you have ANY questions please call the office [phone] and ask for Maysoon Lozada RNor send me a MyChart message  * Occasionally, EP Studies and ablations can become lengthy. Please make your family aware of this before your procedure starts. Average time ranges from 2-8 hours for EP studies/ablations. Your physician will Randall family after the procedure with the results.    Cardiac Ablation  Cardiac ablation is a procedure to stop some heart tissue from causing problems. The  heart has many electrical connections. Sometimes these connections make the heart beat very fast or irregularly. Removing some problem areas can improve the heart rhythm or make it normal. What happens before the procedure?  Follow instructions from your doctor about what you cannot eat or drink.  Ask your doctor about: ? Changing or stopping your normal medicines. This is important if you take diabetes medicines or blood thinners. ? Taking medicines such as aspirin and ibuprofen. These medicines can thin your blood. Do not take these medicines before your procedure if your doctor tells you not to.  Plan to have someone take you home.  If you will be going home right after the procedure, plan to have someone with you for 24 hours. What happens during the procedure?  To lower your risk of infection: ? Your health care team will wash or sanitize their hands. ? Your skin will be washed with soap. ? Hair may be removed from your neck or  groin.  An IV tube will be put into one of your veins.  You will be given a medicine to help you relax (sedative).  Skin on your neck or groin will be numbed.  A cut (incision) will be made in your neck or groin.  A needle will be put through your cut and into a vein in your neck or groin.  A tube (catheter) will be put into the needle. The tube will be moved to your heart. X-rays (fluoroscopy) will be used to help guide the tube.  Small devices (electrodes) on the tip of the tube will send out electrical currents.  Dye may be put through the tube. This helps your surgeon see your heart.  Electrical energy will be used to scar (ablate) some heart tissue. Your surgeon may use: ? Heat (radiofrequency energy). ? Laser energy. ? Extreme cold (cryoablation).  The tube will be taken out.  Pressure will be held on your cut. This helps stop bleeding.  A bandage (dressing) will be put on your cut. The procedure may vary. What happens after the procedure?  You will be monitored until your medicines have worn off.  Your cut will be watched for bleeding. You will need to lie still for a few hours.  Do not drive for 24 hours or as long as your doctor tells you. Summary  Cardiac ablation is a procedure to stop some heart tissue from causing problems.  Electrical energy will be used to scar (ablate) some heart tissue. This information is not intended to replace advice given to you by your health care provider. Make sure you discuss any questions you have with your health care provider. Document Revised: 04/26/2017 Document Reviewed: 04/02/2016 Elsevier Patient Education  2020 [employer].

## 2020-05-17 ENCOUNTER — Other Ambulatory Visit: Payer: Self-pay | Admitting: Gastroenterology

## 2020-05-23 ENCOUNTER — Telehealth: Payer: Self-pay | Admitting: Gastroenterology

## 2020-05-23 MED ORDER — OMEPRAZOLE 40 MG PO CPDR
DELAYED_RELEASE_CAPSULE | ORAL | 0 refills | Status: DC
Start: 2020-05-23 — End: 2020-08-02

## 2020-05-23 NOTE — Telephone Encounter (Signed)
Patient needs appointment with Dr Russella Dar as noted in his previous rx request. Thanks

## 2020-05-24 ENCOUNTER — Telehealth: Payer: Self-pay | Admitting: Cardiology

## 2020-05-24 ENCOUNTER — Other Ambulatory Visit: Payer: Self-pay

## 2020-05-24 ENCOUNTER — Other Ambulatory Visit: Payer: Medicare Other | Admitting: *Deleted

## 2020-05-24 ENCOUNTER — Telehealth (HOSPITAL_COMMUNITY): Payer: Self-pay | Admitting: *Deleted

## 2020-05-24 DIAGNOSIS — I480 Paroxysmal atrial fibrillation: Secondary | ICD-10-CM

## 2020-05-24 DIAGNOSIS — Z01812 Encounter for preprocedural laboratory examination: Secondary | ICD-10-CM

## 2020-05-24 LAB — SPECIMEN STATUS REPORT

## 2020-05-24 NOTE — Telephone Encounter (Signed)
    Pt would like to speak with Sherri about his upcoming procedure

## 2020-05-24 NOTE — Telephone Encounter (Signed)
Returned pt call. Pt concerned over Covid contact risk coming into the hospital (this is not a new concern and previous reason as to holding off on procedure). Pt and I discussed this. Pt feels better about proceeding with procedure and appreciates my return call/discussion on this matter.

## 2020-05-24 NOTE — Telephone Encounter (Signed)
Reaching out to patient to offer assistance regarding upcoming cardiac imaging study; pt verbalizes understanding of appt date/time, parking situation and where to check in, pre-test NPO status, and verified current allergies; name and call back number provided for further questions should they arise  [place] [facility] [phone] office [phone] cell

## 2020-05-25 LAB — BASIC METABOLIC PANEL
BUN/Creatinine Ratio: 18 (ref 9–20)
BUN: 19 mg/dL (ref 6–24)
CO2: 20 mmol/L (ref 20–29)
Calcium: 10 mg/dL (ref 8.7–10.2)
Chloride: 101 mmol/L (ref 96–106)
Creatinine, Ser: 1.04 mg/dL (ref 0.76–1.27)
GFR calc Af Amer: 98 mL/min/{1.73_m2} (ref >59)
GFR calc non Af Amer: 85 mL/min/{1.73_m2} (ref >59)
Glucose: 96 mg/dL (ref 65–99)
Potassium: 4.4 mmol/L (ref 3.5–5.2)
Sodium: 139 mmol/L (ref 134–144)

## 2020-05-25 LAB — CBC

## 2020-05-26 ENCOUNTER — Ambulatory Visit (HOSPITAL_COMMUNITY)
Admission: RE | Admit: 2020-05-26 | Discharge: 2020-05-26 | Disposition: A | Discharge status: Home / Self Care | Payer: Medicare Other | Source: Ambulatory Visit | Attending: Cardiology | Admitting: Cardiology

## 2020-05-26 ENCOUNTER — Other Ambulatory Visit: Payer: Self-pay

## 2020-05-26 ENCOUNTER — Ambulatory Visit: Payer: Medicare Other | Admitting: Cardiology

## 2020-05-26 DIAGNOSIS — I480 Paroxysmal atrial fibrillation: Secondary | ICD-10-CM | POA: Insufficient documentation

## 2020-05-26 MED ORDER — IOHEXOL 350 MG/ML SOLN
80.0000 mL | Freq: Once | INTRAVENOUS | Status: AC | PRN
  Administered 2020-05-26: 08:00:00 80 mL via INTRAVENOUS

## 2020-05-31 ENCOUNTER — Other Ambulatory Visit (HOSPITAL_COMMUNITY)
Admission: RE | Admit: 2020-05-31 | Discharge: 2020-05-31 | Disposition: A | Discharge status: Home / Self Care | Payer: Medicare Other | Source: Ambulatory Visit | Attending: Cardiology | Admitting: Cardiology

## 2020-05-31 DIAGNOSIS — Z01812 Encounter for preprocedural laboratory examination: Secondary | ICD-10-CM | POA: Diagnosis present

## 2020-05-31 DIAGNOSIS — Z20822 Contact with and (suspected) exposure to covid-19: Secondary | ICD-10-CM | POA: Diagnosis not present

## 2020-05-31 LAB — SARS CORONAVIRUS 2 (TAT 6-24 HRS): SARS Coronavirus 2: NEGATIVE

## 2020-06-02 NOTE — Anesthesia Preprocedure Evaluation (Addendum)
Anesthesia Evaluation  Patient identified by MRN, date of birth, ID band Patient awake    Reviewed: Allergy & Precautions, H&P , NPO status , Patient's Chart, lab work & pertinent test results  Airway Mallampati: I  TM Distance: >3 FB Neck ROM: Full    Dental no notable dental hx. (+) Teeth Intact, Dental Advisory Given   Pulmonary neg pulmonary ROS, sleep apnea , former smoker,    Pulmonary exam normal breath sounds clear to auscultation       Cardiovascular Pt. on home beta blockers (-) Peripheral Vascular Disease Normal cardiovascular exam Rhythm:Regular Rate:Normal  Echo 01/2012 Left ventricle: The cavity size was normal. Systolic function was normal. The estimated ejection fraction was in the range of 50% to 55%. Wall motion was normal; there were no regional wall motion abnormalities.    Neuro/Psych  Headaches, PSYCHIATRIC DISORDERS Anxiety    GI/Hepatic Neg liver ROS, hiatal hernia, PUD, GERD  Medicated,  Endo/Other  Morbid obesity  Renal/GU Renal disease  negative genitourinary   Musculoskeletal negative musculoskeletal ROS (+) Arthritis , Osteoarthritis,    Abdominal (+) + obese,   Peds negative pediatric ROS (+)  Hematology negative hematology ROS (+)   Anesthesia Other Findings   Reproductive/Obstetrics negative OB ROS                            Anesthesia Physical  Anesthesia Plan  ASA: III  Anesthesia Plan: General   Post-op Pain Management:    Induction: Intravenous  PONV Risk Score and Plan: 2 and Ondansetron and Dexamethasone  Airway Management Planned: Oral ETT and LMA  Additional Equipment:   Intra-op Plan:   Post-operative Plan: Extubation in OR  Informed Consent: I have reviewed the patients History and Physical, chart, labs and discussed the procedure including the risks, benefits and alternatives for the proposed anesthesia with the patient or authorized  representative who has indicated his/her understanding and acceptance.     Dental advisory given  Plan Discussed with: CRNA and Anesthesiologist  Anesthesia Plan Comments:         Anesthesia Quick Evaluation

## 2020-06-03 ENCOUNTER — Encounter (HOSPITAL_COMMUNITY)
Admission: RE | Disposition: A | Discharge status: Home / Self Care | Payer: Medicare Other | Source: Home / Self Care | Attending: Cardiology

## 2020-06-03 ENCOUNTER — Encounter (HOSPITAL_COMMUNITY): Payer: Self-pay | Admitting: Cardiology

## 2020-06-03 ENCOUNTER — Ambulatory Visit (HOSPITAL_COMMUNITY): Payer: Medicare Other | Admitting: Anesthesiology

## 2020-06-03 ENCOUNTER — Ambulatory Visit (HOSPITAL_COMMUNITY)
Admission: RE | Admit: 2020-06-03 | Discharge: 2020-06-03 | Disposition: A | Discharge status: Home / Self Care | Payer: Medicare Other | Attending: Cardiology | Admitting: Cardiology

## 2020-06-03 DIAGNOSIS — Z888 Allergy status to other drugs, medicaments and biological substances status: Secondary | ICD-10-CM | POA: Insufficient documentation

## 2020-06-03 DIAGNOSIS — Z881 Allergy status to other antibiotic agents status: Secondary | ICD-10-CM | POA: Insufficient documentation

## 2020-06-03 DIAGNOSIS — I480 Paroxysmal atrial fibrillation: Secondary | ICD-10-CM | POA: Insufficient documentation

## 2020-06-03 DIAGNOSIS — G4733 Obstructive sleep apnea (adult) (pediatric): Secondary | ICD-10-CM | POA: Diagnosis not present

## 2020-06-03 DIAGNOSIS — Z86711 Personal history of pulmonary embolism: Secondary | ICD-10-CM | POA: Insufficient documentation

## 2020-06-03 DIAGNOSIS — Z8249 Family history of ischemic heart disease and other diseases of the circulatory system: Secondary | ICD-10-CM | POA: Diagnosis not present

## 2020-06-03 HISTORY — PX: ATRIAL FIBRILLATION ABLATION: EP1191

## 2020-06-03 LAB — CBC
HCT: 44 % (ref 39.0–52.0)
Hemoglobin: 14.2 g/dL (ref 13.0–17.0)
MCH: 29.3 pg (ref 26.0–34.0)
MCHC: 32.3 g/dL (ref 30.0–36.0)
MCV: 90.9 fL (ref 80.0–100.0)
Platelets: 244 K/uL (ref 150–400)
RBC: 4.84 MIL/uL (ref 4.22–5.81)
RDW: 12.2 % (ref 11.5–15.5)
WBC: 6.3 K/uL (ref 4.0–10.5)
nRBC: 0 % (ref 0.0–0.2)

## 2020-06-03 LAB — POCT ACTIVATED CLOTTING TIME
Activated Clotting Time: 321 seconds
Activated Clotting Time: 369 seconds
Activated Clotting Time: 327 seconds

## 2020-06-03 SURGERY — ATRIAL FIBRILLATION ABLATION
Anesthesia: General

## 2020-06-03 MED ORDER — HEPARIN (PORCINE) IN NACL 1000-0.9 UT/500ML-% IV SOLN
INTRAVENOUS | Status: AC
  Filled 2020-06-03: qty 2500

## 2020-06-03 MED ORDER — DOBUTAMINE IN D5W 4-5 MG/ML-% IV SOLN
INTRAVENOUS | Status: DC | PRN
  Administered 2020-06-03: 20 ug/kg/min via INTRAVENOUS

## 2020-06-03 MED ORDER — MIDAZOLAM HCL 2 MG/2ML IJ SOLN
INTRAMUSCULAR | Status: DC | PRN
  Administered 2020-06-03: 2 mg via INTRAVENOUS

## 2020-06-03 MED ORDER — ONDANSETRON HCL 4 MG/2ML IJ SOLN: 4.0000 mg | Freq: Four times a day (QID) | INTRAMUSCULAR | Status: DC | PRN

## 2020-06-03 MED ORDER — PROTAMINE SULFATE 10 MG/ML IV SOLN
INTRAVENOUS | Status: DC | PRN
  Administered 2020-06-03: 40 mg via INTRAVENOUS

## 2020-06-03 MED ORDER — PHENYLEPHRINE HCL-NACL 10-0.9 MG/250ML-% IV SOLN
INTRAVENOUS | Status: DC | PRN
  Administered 2020-06-03: 20 ug/min via INTRAVENOUS

## 2020-06-03 MED ORDER — HEPARIN SODIUM (PORCINE) 1000 UNIT/ML IJ SOLN
INTRAMUSCULAR | Status: AC
  Filled 2020-06-03: qty 1

## 2020-06-03 MED ORDER — SODIUM CHLORIDE 0.9% FLUSH: 3.0000 mL | INTRAVENOUS | Status: DC | PRN

## 2020-06-03 MED ORDER — SODIUM CHLORIDE 0.9 % IV SOLN: 250.0000 mL | INTRAVENOUS | Status: DC | PRN

## 2020-06-03 MED ORDER — SUGAMMADEX SODIUM 200 MG/2ML IV SOLN
INTRAVENOUS | Status: DC | PRN
  Administered 2020-06-03: 100 mg via INTRAVENOUS

## 2020-06-03 MED ORDER — HEPARIN (PORCINE) IN NACL 1000-0.9 UT/500ML-% IV SOLN
INTRAVENOUS | Status: DC | PRN
  Administered 2020-06-03 (×5): 500 mL

## 2020-06-03 MED ORDER — SODIUM CHLORIDE 0.9 % IV SOLN: INTRAVENOUS | Status: DC

## 2020-06-03 MED ORDER — ROCURONIUM BROMIDE 10 MG/ML (PF) SYRINGE
PREFILLED_SYRINGE | INTRAVENOUS | Status: DC | PRN
  Administered 2020-06-03: 70 mg via INTRAVENOUS

## 2020-06-03 MED ORDER — LIDOCAINE 2% (20 MG/ML) 5 ML SYRINGE
INTRAMUSCULAR | Status: DC | PRN
  Administered 2020-06-03: 100 mg via INTRAVENOUS

## 2020-06-03 MED ORDER — FENTANYL CITRATE (PF) 250 MCG/5ML IJ SOLN
INTRAMUSCULAR | Status: DC | PRN
  Administered 2020-06-03: 50 ug via INTRAVENOUS
  Administered 2020-06-03: 100 ug via INTRAVENOUS

## 2020-06-03 MED ORDER — ONDANSETRON HCL 4 MG/2ML IJ SOLN
INTRAMUSCULAR | Status: DC | PRN
  Administered 2020-06-03 (×2): 4 mg via INTRAVENOUS

## 2020-06-03 MED ORDER — ACETAMINOPHEN 325 MG PO TABS
650.0000 mg | ORAL_TABLET | ORAL | Status: DC | PRN
  Filled 2020-06-03: qty 2

## 2020-06-03 MED ORDER — DEXAMETHASONE SODIUM PHOSPHATE 10 MG/ML IJ SOLN
INTRAMUSCULAR | Status: DC | PRN
  Administered 2020-06-03: 10 mg via INTRAVENOUS

## 2020-06-03 MED ORDER — PROMETHAZINE HCL 25 MG/ML IJ SOLN
INTRAMUSCULAR | Status: AC
  Filled 2020-06-03: qty 1

## 2020-06-03 MED ORDER — PROMETHAZINE HCL 25 MG/ML IJ SOLN
25.0000 mg | Freq: Once | INTRAMUSCULAR | Status: AC
  Administered 2020-06-03: 25 mg via INTRAVENOUS

## 2020-06-03 MED ORDER — PROPOFOL 10 MG/ML IV BOLUS
INTRAVENOUS | Status: DC | PRN
  Administered 2020-06-03: 160 mg via INTRAVENOUS

## 2020-06-03 MED ORDER — HEPARIN SODIUM (PORCINE) 1000 UNIT/ML IJ SOLN
INTRAMUSCULAR | Status: DC | PRN
  Administered 2020-06-03: 15000 [IU] via INTRAVENOUS
  Administered 2020-06-03: 2000 [IU] via INTRAVENOUS

## 2020-06-03 MED ORDER — HEPARIN SODIUM (PORCINE) 1000 UNIT/ML IJ SOLN
INTRAMUSCULAR | Status: DC | PRN
  Administered 2020-06-03: 1000 [IU] via INTRAVENOUS

## 2020-06-03 MED ORDER — DOBUTAMINE IN D5W 4-5 MG/ML-% IV SOLN
INTRAVENOUS | Status: AC
  Filled 2020-06-03: qty 250

## 2020-06-03 SURGICAL SUPPLY — 21 items
BAG SNAP BAND KOVER 36X36 (MISCELLANEOUS) ×2 IMPLANT
BLANKET WARM UNDERBOD FULL ACC (MISCELLANEOUS) ×2 IMPLANT
CATH 8FR REPROCESSED SOUNDSTAR (CATHETERS) ×2 IMPLANT
CATH MAPPNG PENTARAY F 2-6-2MM (CATHETERS) ×1 IMPLANT
CATH S CIRCA THERM PROBE 10F (CATHETERS) ×2 IMPLANT
CATH SMTCH THERMOCOOL SF DF (CATHETERS) ×2 IMPLANT
CATH WEB BI DIR CSDF CRV REPRO (CATHETERS) ×2 IMPLANT
CLOSURE PERCLOSE PROSTYLE (VASCULAR PRODUCTS) ×10 IMPLANT
COVER SWIFTLINK CONNECTOR (BAG) ×2 IMPLANT
KIT VERSACROSS STEERABLE D1 (CATHETERS) ×2 IMPLANT
PACK EP LATEX FREE (CUSTOM PROCEDURE TRAY) ×2
PACK EP LF (CUSTOM PROCEDURE TRAY) ×1 IMPLANT
PAD PRO RADIOLUCENT 2001M-C (PAD) ×2 IMPLANT
PATCH CARTO3 (PAD) ×2 IMPLANT
PENTARAY F 2-6-2MM (CATHETERS) ×2
SHEATH CARTO VIZIGO SM CVD (SHEATH) ×2 IMPLANT
SHEATH PINNACLE 7F 10CM (SHEATH) ×2 IMPLANT
SHEATH PINNACLE 8F 10CM (SHEATH) ×4 IMPLANT
SHEATH PINNACLE 9F 10CM (SHEATH) ×2 IMPLANT
SHEATH PROBE COVER 6X72 (BAG) ×2 IMPLANT
TUBING SMART ABLATE COOLFLOW (TUBING) ×2 IMPLANT

## 2020-06-03 NOTE — Discharge Instructions (Signed)

## 2020-06-03 NOTE — Progress Notes (Signed)
Patent states nausea is less.

## 2020-06-03 NOTE — Anesthesia Procedure Notes (Signed)
Procedure Name: Intubation Date/Time: 06/03/2020 7:50 AM Performed by: Darletta Moll, CRNA Pre-anesthesia Checklist: Patient identified, Emergency Drugs available, Suction available and Patient being monitored Patient Re-evaluated:Patient Re-evaluated prior to induction Oxygen Delivery Method: Circle system utilized Preoxygenation: Pre-oxygenation with 100% oxygen Induction Type: IV induction Ventilation: Mask ventilation without difficulty Laryngoscope Size: Mac and 4 Grade View: Grade I Tube type: Oral Tube size: 7.5 mm Number of attempts: 1 Airway Equipment and Method: Stylet and Oral airway Placement Confirmation: ETT inserted through vocal cords under direct vision,  positive ETCO2 and breath sounds checked- equal and bilateral Secured at: 23 cm Tube secured with: Tape Dental Injury: Teeth and Oropharynx as per pre-operative assessment

## 2020-06-03 NOTE — Transfer of Care (Signed)
Immediate Anesthesia Transfer of Care Note  Patient: [name]  Procedure(s) Performed: ATRIAL FIBRILLATION ABLATION (N/A )  Patient Location: Cath Lab  Anesthesia Type:General  Level of Consciousness: drowsy and patient cooperative  Airway & Oxygen Therapy: Patient Spontanous Breathing and Patient connected to nasal cannula oxygen  Post-op Assessment: Report given to RN, Post -op Vital signs reviewed and stable and Patient moving all extremities X 4  Post vital signs: Reviewed and stable  Last Vitals:  Vitals Value Taken Time  BP 118/58 06/03/20 1027  Temp    Pulse 93 06/03/20 1028  Resp 17 06/03/20 1028  SpO2 95 % 06/03/20 1028  Vitals shown include unvalidated device data.  Last Pain:  Vitals:   06/03/20 0555  TempSrc:   PainSc: 0-No pain      Patients Stated Pain Goal: 3 (23/30/07 6226)  Complications: No complications documented.

## 2020-06-03 NOTE — Progress Notes (Signed)
Patient was given discharge instructions. He verbalized understanding. 
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
## 2020-06-03 NOTE — Anesthesia Postprocedure Evaluation (Signed)
Anesthesia Post Note  Patient: [name]  Procedure(s) Performed: ATRIAL FIBRILLATION ABLATION (N/A )     Patient location during evaluation: PACU Anesthesia Type: General Level of consciousness: awake and alert Pain management: pain level controlled Vital Signs Assessment: post-procedure vital signs reviewed and stable Respiratory status: spontaneous breathing, nonlabored ventilation, respiratory function stable and patient connected to nasal cannula oxygen Cardiovascular status: blood pressure returned to baseline and stable Postop Assessment: no apparent nausea or vomiting Anesthetic complications: no   No complications documented.  Last Vitals:  Vitals:   06/03/20 1120 06/03/20 1130  BP: 120/78 123/71  Pulse: 86 87  Resp: 12 13  Temp:    SpO2: 96% 97%    Last Pain:  Vitals:   06/03/20 1110  TempSrc: Temporal  PainSc:                  Camdon Saetern

## 2020-06-04 ENCOUNTER — Telehealth: Payer: Self-pay | Admitting: Cardiology

## 2020-06-04 NOTE — Telephone Encounter (Signed)
Pt called with general questions about ablation puncture sites.  Also has palpitations maybe more than post op.  He will call back if increase or are bothersome.  Will send to Dr. Curt Bears as well.

## 2020-06-06 ENCOUNTER — Telehealth: Payer: Self-pay | Admitting: Cardiology

## 2020-06-06 ENCOUNTER — Encounter (HOSPITAL_COMMUNITY): Payer: Self-pay | Admitting: Cardiology

## 2020-06-06 NOTE — Telephone Encounter (Signed)
Patient c/o Palpitations:  High priority if patient c/o lightheadedness, shortness of breath, or chest pain  1) How long have you had palpitations/irregular HR/ Afib? Are you having the symptoms now? Palpitations daily since his procedure on 06/03/2020  2) Are you currently experiencing lightheadedness, SOB or CP? Lightheaded today.  3) Do you have a history of afib (atrial fibrillation) or irregular heart rhythm? Yes, AFIB.  4) Have you checked your BP or HR? (document readings if available): 130/80  5) Are you experiencing any other symptoms? Patient is calling stating that since he had his procedure on 06/03/2020 he's been experiencing a headache that will not subside and he states that he's also seeing star like figures. Please advise.

## 2020-06-06 NOTE — Telephone Encounter (Signed)
Returned call to patient who states he has had a headache and slight nausea since the a fib ablation on 1/7.  Denies hx frequent headaches but states he usually has bad pain when he has them; usually takes Goody powders but is aware that he should avoid aspirin at this time. He denies caffeine consumption and reports that he drinks 4-5 water bottles daily. States he is eating well.  Has mild dizziness occasionally with ambulation - denies that this occurs with position changes but feels an occasional wave of dizziness while up moving around. States his face feels flushed and his throat is sore from intubation. He has had mild nausea since waking up from anesthesia. BP today: 137/76, 128/85, HR 70-80 occasional flutters, palpitations. Has taken 1000 mg Tylenol today. Advised him to take an additional 1000 mg Tylenol now and again in 4-6 hours for max of 3000 mg and to increase hydration. Advised I will forward message to Dr. Curt Bears for additional advice. Patient verbalized understanding and agreement with plan and thanked me for the call.

## 2020-06-07 NOTE — Telephone Encounter (Signed)
Spoke to Dr. Curt Bears. Spoke to pt. Pt advised he can take Ibuprofen 600 mg BID for next couple days PRN for headache.  (he does think symptoms are secondary to the HA/pain). Patient verbalized understanding and agreeable to plan.  Advised to call PCP if no improvement in HA by end of week/next week.  Aware it may be SE of anesthesia.   Pt agreeable to plan.

## 2020-06-23 ENCOUNTER — Ambulatory Visit: Payer: Medicare Other | Admitting: Gastroenterology
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
## 2020-07-05 ENCOUNTER — Ambulatory Visit (HOSPITAL_COMMUNITY)
Admission: RE | Admit: 2020-07-05 | Discharge: 2020-07-05 | Disposition: A | Discharge status: Home / Self Care | Payer: Medicare Other | Source: Ambulatory Visit | Attending: Physician Assistant | Admitting: Physician Assistant

## 2020-07-05 ENCOUNTER — Other Ambulatory Visit: Payer: Self-pay

## 2020-07-05 ENCOUNTER — Encounter (HOSPITAL_COMMUNITY): Payer: Self-pay | Admitting: Physician Assistant

## 2020-07-05 VITALS — BP 112/74 | HR 67 | Ht 69.0 in | Wt 225.6 lb

## 2020-07-05 DIAGNOSIS — Z560 Unemployment, unspecified: Secondary | ICD-10-CM | POA: Insufficient documentation

## 2020-07-05 DIAGNOSIS — Z9989 Dependence on other enabling machines and devices: Secondary | ICD-10-CM | POA: Insufficient documentation

## 2020-07-05 DIAGNOSIS — Z7901 Long term (current) use of anticoagulants: Secondary | ICD-10-CM | POA: Diagnosis not present

## 2020-07-05 DIAGNOSIS — Z888 Allergy status to other drugs, medicaments and biological substances status: Secondary | ICD-10-CM | POA: Diagnosis not present

## 2020-07-05 DIAGNOSIS — G4733 Obstructive sleep apnea (adult) (pediatric): Secondary | ICD-10-CM | POA: Insufficient documentation

## 2020-07-05 DIAGNOSIS — I480 Paroxysmal atrial fibrillation: Secondary | ICD-10-CM | POA: Diagnosis present

## 2020-07-05 DIAGNOSIS — Z881 Allergy status to other antibiotic agents status: Secondary | ICD-10-CM | POA: Diagnosis not present

## 2020-07-05 DIAGNOSIS — Z79899 Other long term (current) drug therapy: Secondary | ICD-10-CM | POA: Diagnosis not present

## 2020-08-02 ENCOUNTER — Encounter: Payer: Self-pay | Admitting: Gastroenterology

## 2020-08-02 ENCOUNTER — Other Ambulatory Visit: Payer: Self-pay

## 2020-08-02 ENCOUNTER — Ambulatory Visit: Payer: Medicare Other | Admitting: Gastroenterology

## 2020-08-02 VITALS — BP 110/78 | HR 60 | Ht 69.0 in | Wt 222.2 lb

## 2020-08-02 DIAGNOSIS — R1013 Epigastric pain: Secondary | ICD-10-CM | POA: Diagnosis not present

## 2020-08-02 DIAGNOSIS — K219 Gastro-esophageal reflux disease without esophagitis: Secondary | ICD-10-CM | POA: Diagnosis not present

## 2020-08-02 DIAGNOSIS — Z1211 Encounter for screening for malignant neoplasm of colon: Secondary | ICD-10-CM | POA: Diagnosis not present

## 2020-08-02 DIAGNOSIS — G8929 Other chronic pain: Secondary | ICD-10-CM

## 2020-08-02 DIAGNOSIS — R102 Pelvic and perineal pain: Secondary | ICD-10-CM | POA: Diagnosis not present

## 2020-08-02 MED ORDER — GLYCOPYRROLATE 1 MG PO TABS: 1.0000 mg | ORAL_TABLET | Freq: Two times a day (BID) | ORAL | 11 refills | Status: DC

## 2020-08-02 MED ORDER — OMEPRAZOLE 40 MG PO CPDR: DELAYED_RELEASE_CAPSULE | ORAL | 3 refills | Status: DC

## 2020-08-02 NOTE — Progress Notes (Signed)
    History of Present Illness: This is a 50 year old male with IBS, GERD complaining of frequent nausea, epigastric discomfort, suprapubic pain, occasional right lower quadrant pain.  He denies active reflux symptoms.  Epigastric pain, nausea is not controlled with omeprazole.  Symptoms are sporadic and not clearly related to meals, medications or time of day.  He has chronic suprapubic pubic discomfort that has not changed with bowel movements urination meals or any other activity.  He has intermittent right lower quadrant crampy pain. CT AP on February 09, 2019 showing bilateral renal calculi lumbar spine DDD, bilateral renal cysts and diverticulosis.  He relates that he typically has 2 bowel movements per day and occasionally will have more.  He recently underwent A. fib ablation and is managed currently on Xarelto.  He notes he will often have belching and abdominal bloating in the morning after using OSA equipment.   Current Medications, Allergies, Past Medical History, Past Surgical History, Family History and Social History were reviewed in [employer] record.   Physical Exam: General: Well developed, well nourished, no acute distress Head: Normocephalic and atraumatic Eyes: Sclerae anicteric, EOMI Ears: Normal auditory acuity Mouth: Not examined, mask on during Covid-19 pandemic Lungs: Clear throughout to auscultation Heart: Regular rate and rhythm; no murmurs, rubs or bruits Abdomen: Soft, mild suprapubic tenderness and non distended. No masses, hepatosplenomegaly or hernias noted. Normal Bowel sounds Rectal: Not done Musculoskeletal: Symmetrical with no gross deformities  Pulses:  Normal pulses noted Extremities: No clubbing, cyanosis, edema or deformities noted Neurological: Alert oriented x 4, grossly nonfocal Psychological:  Alert and cooperative. Anxious   Assessment and Recommendations:  1. GERD. Epigastric pain, nausea, dyspepsia.  Continue  omeprazole 40 mg daily.  Follow antireflux measures.  Begin FDgard 1-2 p.o. 3 times daily as needed for nausea, epigastric pain, dyspepsia.  If symptoms are not adequately controlled consider trial of Zofran.  2. Chronic suprapubic pain, intermittent RLQ pain. Frequent stools. Etiology unclear. Could be related to IBS. CT AP in Sept 2020 was unrevealing.   3. OSA leading to increased intestinal gas, belching.  Contact physician following OSA for further management.  4. CRC screening, average risk. Colonoscopy later this year after A. fib controlled and hopefully off anticoagulation..   5. Hepatic steatosis.  Advised to completely discontinue alcohol use.  Long-term carb modified, fat modified, weight loss diet supervised by his PCP.  6. GAD.  This could be contributing to or causing several of his symptoms.  Further management and follow up with his PCP.   7.  A. fib with recent ablation currently taking Xarelto anticoagulation.

## 2020-08-02 NOTE — Patient Instructions (Signed)
We have sent the following medications to your pharmacy for you to pick up at your convenience: omeprazole and robinul.   Start the samples of FD Guard taking 1-2 capsules three times a day as needed. This can be purchased over the counter.   Thank you for choosing me and [facility].  Pricilla Riffle. Dagoberto Ligas., MD., Marval Regal

## 2020-08-09 ENCOUNTER — Other Ambulatory Visit: Payer: Self-pay

## 2020-08-10 DIAGNOSIS — R002 Palpitations: Secondary | ICD-10-CM

## 2020-08-10 NOTE — Telephone Encounter (Signed)
Rn called patient and he stated that he was doing fine, but wanted to talk to Dr. Curt Bears nurse regarding having heart fluttering/paloitations on and off. Patient stated that he had an ablation in January, and understood that having fluttering s/p ablation was normal per s/p appointment.   Patient stated that after his first and second month he did have flutters or if he went to lay down after work he could feel that he was going into atrial fib; and would sit up and cough and would go back to normal rhythm.  Patient stated that now in the third month he is feeling the flutters are coming more frequently. Patient did state that when he stood up he would still return to normal rhythm quickly. Pt stated he was not having any other symptoms at this time. Patient also stated that he was taking his medications as ordered.  RN stated a message would be sent to Dr. Curt Bears and his nurse to make them aware.  Manuela Schwartz RN

## 2020-08-17 ENCOUNTER — Encounter: Payer: Self-pay | Admitting: Radiology

## 2020-08-17 ENCOUNTER — Ambulatory Visit (INDEPENDENT_AMBULATORY_CARE_PROVIDER_SITE_OTHER): Payer: Medicare Other

## 2020-08-17 DIAGNOSIS — R002 Palpitations: Secondary | ICD-10-CM

## 2020-08-17 NOTE — Telephone Encounter (Signed)
Followed up with patient. Advised of Dr. Curt Bears recommendation, pt agreeable to plan. Aware monitor instructions will be sent via mychart.

## 2020-08-17 NOTE — Progress Notes (Signed)
Enrolled patient for a 7 day Zio XT Monitor to be mailed to patiens home.

## 2020-08-21 DIAGNOSIS — R002 Palpitations: Secondary | ICD-10-CM

## 2020-09-02 ENCOUNTER — Emergency Department (HOSPITAL_COMMUNITY)
Admission: EM | Admit: 2020-09-02 | Discharge: 2020-09-03 | Disposition: A | Discharge status: Home / Self Care | Payer: Medicare Other | Attending: Emergency Medicine | Admitting: Emergency Medicine

## 2020-09-02 ENCOUNTER — Encounter (HOSPITAL_COMMUNITY): Payer: Self-pay

## 2020-09-02 DIAGNOSIS — Z7901 Long term (current) use of anticoagulants: Secondary | ICD-10-CM | POA: Diagnosis not present

## 2020-09-02 DIAGNOSIS — R519 Headache, unspecified: Secondary | ICD-10-CM

## 2020-09-02 MED ORDER — MECLIZINE HCL 25 MG PO TABS
25.0000 mg | ORAL_TABLET | Freq: Once | ORAL | Status: AC
  Administered 2020-09-03: 25 mg via ORAL
  Filled 2020-09-02: qty 1

## 2020-09-02 MED ORDER — ONDANSETRON HCL 4 MG/2ML IJ SOLN
4.0000 mg | Freq: Once | INTRAMUSCULAR | Status: AC
  Administered 2020-09-03: 4 mg via INTRAVENOUS
  Filled 2020-09-02: qty 2

## 2020-09-02 NOTE — ED Triage Notes (Signed)
Emergency Medicine Provider Triage Evaluation Note  [name] , a 50 y.o. male  was evaluated in triage.  Pt complains of sinus pressure, numbness to face and unbalanced feeling.[name]  Has been present over the last 3 days.    2 weeks prior started developing sinus pressure, was treated with amoxicillin x10 days.  Symptoms did not clear up and he started on clindamycin, on day 9 of 10.    Review of Systems  Positive: Dizziness, sinus pressure, nausea, couigh Negative: Fever, chills, shob, facial asymmetry, slurred speech,   Physical Exam  BP 129/84 (BP Location: Right Arm)   Pulse 83   Temp 98.3 F (36.8 C) (Oral)   Resp 16   SpO2 97%  Gen:   Awake, no distress  HEENT:  Atraumatic, no sinus pressure, EOM intact, PERRL Resp:  Normal effort  Cardiac:  Normal rate  Abd:   Nondistended, nontender  MSK:   Moves extremities without difficulty  Neuro:  Speech clear, gait intact, CN 2-12 grossly intact   Medical Decision Making  Medically screening exam initiated at 9:10 PM.  Appropriate orders placed.  [name] was informed that the remainder of the evaluation will be completed by another provider, this initial triage assessment does not replace that evaluation, and the importance of remaining in the ED until their evaluation is complete.  Clinical Impression   The patient appears stable so that the remainder of the work up may be completed by another provider.     Loni Beckwith, [place] 09/02/20 2128

## 2020-09-02 NOTE — ED Triage Notes (Addendum)
Pt reports that several weeks ago, felt like he was getting a sinus infection, saw telemedicine, was put on amoxicillin for 10 days, no improvement, and was put on Clindamycin. Still has one day left on  clindamycin and several days ago it worsened. Went to ophthalmology for eye pain and was placed on steroid drops. Now is getting dizzy and having pain to face from teeth upwards, and feeling tingly in fingers.

## 2020-09-03 ENCOUNTER — Emergency Department (HOSPITAL_COMMUNITY): Payer: Medicare Other

## 2020-09-03 LAB — CBC WITH DIFFERENTIAL/PLATELET
Abs Immature Granulocytes: 0 10*3/uL (ref 0.00–0.07)
Basophils Absolute: 0 10*3/uL (ref 0.0–0.1)
Basophils Relative: 1 %
Eosinophils Absolute: 0.1 10*3/uL (ref 0.0–0.5)
Eosinophils Relative: 1 %
HCT: 42 % (ref 39.0–52.0)
Hemoglobin: 14.2 g/dL (ref 13.0–17.0)
Immature Granulocytes: 0 %
Lymphocytes Relative: 46 %
Lymphs Abs: 2.1 10*3/uL (ref 0.7–4.0)
MCH: 30.5 pg (ref 26.0–34.0)
MCHC: 33.8 g/dL (ref 30.0–36.0)
MCV: 90.1 fL (ref 80.0–100.0)
Monocytes Absolute: 0.5 10*3/uL (ref 0.1–1.0)
Monocytes Relative: 12 %
Neutro Abs: 1.7 10*3/uL (ref 1.7–7.7)
Neutrophils Relative %: 40 %
Platelets: 229 10*3/uL (ref 150–400)
RBC: 4.66 MIL/uL (ref 4.22–5.81)
RDW: 12.6 % (ref 11.5–15.5)
WBC: 4.4 10*3/uL (ref 4.0–10.5)
nRBC: 0 % (ref 0.0–0.2)

## 2020-09-03 LAB — BASIC METABOLIC PANEL
Anion gap: 6 (ref 5–15)
BUN: 14 mg/dL (ref 6–20)
CO2: 26 mmol/L (ref 22–32)
Calcium: 9.2 mg/dL (ref 8.9–10.3)
Chloride: 106 mmol/L (ref 98–111)
Creatinine, Ser: 1.01 mg/dL (ref 0.61–1.24)
GFR, Estimated: >60 mL/min (ref >60)
Glucose, Bld: 110 mg/dL — ABNORMAL HIGH (ref 70–99)
Potassium: 3.7 mmol/L (ref 3.5–5.1)
Sodium: 138 mmol/L (ref 135–145)

## 2020-09-03 MED ORDER — GADOBUTROL 1 MMOL/ML IV SOLN
10.0000 mL | Freq: Once | INTRAVENOUS | Status: AC | PRN
  Administered 2020-09-03: 10 mL via INTRAVENOUS

## 2020-09-03 MED ORDER — IOHEXOL 300 MG/ML  SOLN
75.0000 mL | Freq: Once | INTRAMUSCULAR | Status: AC | PRN
  Administered 2020-09-03: 75 mL via INTRAVENOUS

## 2020-09-03 MED ORDER — LORAZEPAM 2 MG/ML IJ SOLN
1.0000 mg | Freq: Once | INTRAMUSCULAR | Status: AC
  Administered 2020-09-03: 1 mg via INTRAVENOUS
  Filled 2020-09-03: qty 1
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
## 2020-09-03 NOTE — ED Notes (Signed)
Patient transported to MRI 

## 2020-09-03 NOTE — Discharge Instructions (Signed)
Your CT scan did not show any sign of a sinus infection.  Your MRI scan did not show any sign of multiple sclerosis or any other abnormality.  The cause for your symptoms is not clear.  Please follow-up with the ear nose throat physician that you had seen previously, and keep your appointment with the ophthalmologist.  You are welcome to return at any time for reevaluation - especially if you develop any new or concerning symptoms.

## 2020-09-06 ENCOUNTER — Ambulatory Visit: Payer: Medicare Other | Admitting: Cardiology
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
## 2020-09-27 ENCOUNTER — Other Ambulatory Visit: Payer: Self-pay

## 2020-09-27 ENCOUNTER — Encounter: Payer: Self-pay | Admitting: Cardiology

## 2020-09-27 ENCOUNTER — Ambulatory Visit: Payer: Medicare Other | Admitting: Cardiology

## 2020-09-27 VITALS — BP 130/88 | HR 57 | Ht 69.0 in | Wt 220.0 lb

## 2020-09-27 DIAGNOSIS — I480 Paroxysmal atrial fibrillation: Secondary | ICD-10-CM | POA: Diagnosis not present

## 2020-09-27 MED ORDER — METOPROLOL SUCCINATE ER 25 MG PO TB24: 25.0000 mg | ORAL_TABLET | Freq: Every day | ORAL | 3 refills | Status: DC
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
## 2020-09-27 NOTE — Patient Instructions (Signed)
Medication Instructions:  Your physician has recommended you make the following change in your medication:  1. INCREASE Toprol to 25 mg twice daily 2. STOP Xarelto  *If you need a refill on your cardiac medications before your next appointment, please call your pharmacy*   Lab Work: None ordered   Testing/Procedures: None ordered   Follow-Up: At [facility], you and your health needs are our priority.  As part of our continuing mission to provide you with exceptional heart care, we have created designated Provider Care Teams.  These Care Teams include your primary Cardiologist (physician) and Advanced Practice Providers (APPs -  Physician Assistants and Nurse Practitioners) who all work together to provide you with the care you need, when you need it.  Your next appointment:   3 month(s)  The format for your next appointment:   In Person  Provider:   Allegra Lai, MD    Thank you for choosing [facility]!!   Trinidad Curet, RN [phone]   Other Instructions

## 2020-10-19 ENCOUNTER — Encounter: Payer: Self-pay | Admitting: Neurology

## 2020-10-31 ENCOUNTER — Ambulatory Visit: Payer: Medicare Other | Admitting: Neurology

## 2020-10-31 ENCOUNTER — Other Ambulatory Visit: Payer: Self-pay

## 2020-10-31 ENCOUNTER — Encounter: Payer: Self-pay | Admitting: Neurology

## 2020-10-31 VITALS — BP 130/86 | HR 73 | Ht 69.0 in | Wt 224.0 lb

## 2020-10-31 DIAGNOSIS — M542 Cervicalgia: Secondary | ICD-10-CM | POA: Diagnosis not present

## 2020-10-31 NOTE — Patient Instructions (Signed)
Start neck physiotherapy
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
## 2020-12-15 ENCOUNTER — Telehealth: Payer: Self-pay | Admitting: Gastroenterology

## 2020-12-15 NOTE — Telephone Encounter (Signed)
Inbound call from pt requesting a call back stating that he is having some stomach burning and wanted to see what can he do before his appt that can help him. Please advise. Thanks

## 2020-12-15 NOTE — Telephone Encounter (Signed)
Patient reports that he is having epigastric pain for the last few weeks.  He was drinking V8 in the afternoon and has made his symptoms worse.  He is advised to try a bland diet, increase his omeprazole to BID for 2 weeks then resume 40 mg daily. He is advised he can also try pepcid [facility] or pepcid complete for break through until OV in August.  He will call back if these measures fail to improve his symptoms.

## 2020-12-19 ENCOUNTER — Telehealth: Payer: Self-pay | Admitting: Gastroenterology

## 2020-12-19 MED ORDER — FAMOTIDINE 40 MG PO TABS: 40.0000 mg | ORAL_TABLET | Freq: Every day | ORAL | 3 refills | Status: DC

## 2020-12-19 NOTE — Telephone Encounter (Signed)
If he has not tried FDgard then take it 1-2 po tid Start famotidine 40 mg po qpm Continue omeprazole 40 mg po qam If above not helpful after 2 weeks add Carafate 1g suspension tid

## 2020-12-19 NOTE — Telephone Encounter (Signed)
Patient aware New rx sent He is taking FDGard

## 2020-12-19 NOTE — Telephone Encounter (Signed)
See phone notes from 7/21 Patient reports that recommendations have only helped slightly and only for an hour or so then his symptoms of "stomach burning " return.  He is asking if there are additional medications that can be prescribed to help.  NO earlier appts with you or APP.  He is scheduled for 8/30 for follow up with you.

## 2020-12-21 ENCOUNTER — Telehealth: Payer: Self-pay | Admitting: Cardiology

## 2020-12-21 NOTE — Telephone Encounter (Signed)
Patient c/o Palpitations:  High priority if patient c/o lightheadedness, shortness of breath, or chest pain  How long have you had palpitations/irregular HR/ Afib? Are you having the symptoms now? About 3 to 4 days ago  Are you currently experiencing lightheadedness, SOB or CP? CP  Do you have a history of afib (atrial fibrillation) or irregular heart rhythm? No went into AFIB a few years  Have you checked your BP or HR? (document readings if available): no   Are you experiencing any other symptoms? no

## 2020-12-21 NOTE — Telephone Encounter (Signed)
Spoke with the patient who states that since his ablation he had been having some on and off symptoms but recently over the last month he has felt like he was getting better. However several days ago he began to have feeling over a skipped heart beat every 30 minutes or so. He states that he will get pressure in his throat and chest when his heart skips a beat. He states his HR has been in the 50s-60s. He states BP has been slightly elevated. Currently 132/76.  Patient also complains of some dizziness at times but does not feel as if he is going to pass out.  He was evaluated in the ER for dizziness and facial numbness back in April. Overall symptoms have resolved and dizziness has improved however still present at times.  Denies SOB or chest pain.  He is only taking 1/2 tablet (12.5 mg) of Toprol XL as his heart rate usually runs on the lower side so he is nervous to take a whole tablet.

## 2020-12-22 NOTE — Telephone Encounter (Signed)
Left message to call back  (Dr. Curt Bears recommends ordering 2 week zio to determine what the skipped beats are)

## 2021-01-10 ENCOUNTER — Ambulatory Visit: Payer: Medicare Other | Admitting: Cardiology
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
## 2021-01-12 ENCOUNTER — Other Ambulatory Visit: Payer: Self-pay

## 2021-01-12 ENCOUNTER — Encounter: Payer: Self-pay | Admitting: Student

## 2021-01-12 ENCOUNTER — Ambulatory Visit: Payer: Medicare Other | Admitting: Student

## 2021-01-12 VITALS — BP 126/74 | HR 66 | Ht 69.0 in | Wt 223.6 lb

## 2021-01-12 DIAGNOSIS — R002 Palpitations: Secondary | ICD-10-CM | POA: Diagnosis not present

## 2021-01-12 DIAGNOSIS — I480 Paroxysmal atrial fibrillation: Secondary | ICD-10-CM

## 2021-01-12 DIAGNOSIS — G4733 Obstructive sleep apnea (adult) (pediatric): Secondary | ICD-10-CM | POA: Diagnosis not present

## 2021-01-12 NOTE — Patient Instructions (Signed)
Medication Instructions:  Your physician recommends that you continue on your current medications as directed. Please refer to the Current Medication list given to you today. *If you need a refill on your cardiac medications before your next appointment, please call your pharmacy*   Lab Work: None ordered.  If you have labs (blood work) drawn today and your tests are completely normal, you will receive your results only by: . MyChart Message (if you have MyChart) OR . A paper copy in the mail If you have any lab test that is abnormal or we need to change your treatment, we will call you to review the results.   Testing/Procedures: None ordered.    Follow-Up: At [facility], you and your health needs are our priority.  As part of our continuing mission to provide you with exceptional heart care, we have created designated Provider Care Teams.  These Care Teams include your primary Cardiologist (physician) and Advanced Practice Providers (APPs -  Physician Assistants and Nurse Practitioners) who all work together to provide you with the care you need, when you need it.  We recommend signing up for the patient portal called "MyChart".  Sign up information is provided on this After Visit Summary.  MyChart is used to connect with patients for Virtual Visits (Telemedicine).  Patients are able to view lab/test results, encounter notes, upcoming appointments, etc.  Non-urgent messages can be sent to your provider as well.   To learn more about what you can do with MyChart, go to [email or web address].    Your next appointment:   6 month(s)  The format for your next appointment:   In Person  Provider:   Will Camnitz, MD     

## 2021-01-24 ENCOUNTER — Other Ambulatory Visit (INDEPENDENT_AMBULATORY_CARE_PROVIDER_SITE_OTHER): Payer: Medicare Other

## 2021-01-24 ENCOUNTER — Encounter: Payer: Self-pay | Admitting: Gastroenterology

## 2021-01-24 ENCOUNTER — Ambulatory Visit (INDEPENDENT_AMBULATORY_CARE_PROVIDER_SITE_OTHER): Payer: Medicare Other | Admitting: Gastroenterology

## 2021-01-24 VITALS — BP 110/78 | HR 88 | Ht 69.0 in | Wt 222.4 lb

## 2021-01-24 DIAGNOSIS — K219 Gastro-esophageal reflux disease without esophagitis: Secondary | ICD-10-CM

## 2021-01-24 LAB — CBC WITH DIFFERENTIAL/PLATELET
Basophils Absolute: 0 10*3/uL (ref 0.0–0.1)
Basophils Relative: 0.6 % (ref 0.0–3.0)
Eosinophils Absolute: 0 10*3/uL (ref 0.0–0.7)
Eosinophils Relative: 0.7 % (ref 0.0–5.0)
HCT: 43.5 % (ref 39.0–52.0)
Hemoglobin: 14.6 g/dL (ref 13.0–17.0)
Lymphocytes Relative: 43.3 % (ref 12.0–46.0)
Lymphs Abs: 2.1 10*3/uL (ref 0.7–4.0)
MCHC: 33.6 g/dL (ref 30.0–36.0)
MCV: 89 fl (ref 78.0–100.0)
Monocytes Absolute: 0.6 10*3/uL (ref 0.1–1.0)
Monocytes Relative: 11.9 % (ref 3.0–12.0)
Neutro Abs: 2.1 10*3/uL (ref 1.4–7.7)
Neutrophils Relative %: 43.5 % (ref 43.0–77.0)
Platelets: 266 10*3/uL (ref 150.0–400.0)
RBC: 4.89 Mil/uL (ref 4.22–5.81)
RDW: 13.4 % (ref 11.5–15.5)
WBC: 4.9 10*3/uL (ref 4.0–10.5)

## 2021-01-24 LAB — COMPREHENSIVE METABOLIC PANEL
ALT: 28 U/L (ref 0–53)
AST: 18 U/L (ref 0–37)
Albumin: 4.6 g/dL (ref 3.5–5.2)
Alkaline Phosphatase: 41 U/L (ref 39–117)
BUN: 15 mg/dL (ref 6–23)
CO2: 27 mEq/L (ref 19–32)
Calcium: 10.4 mg/dL (ref 8.4–10.5)
Chloride: 103 mEq/L (ref 96–112)
Creatinine, Ser: 1.09 mg/dL (ref 0.40–1.50)
GFR: 80.67 mL/min (ref >60.00)
Glucose, Bld: 74 mg/dL (ref 70–99)
Potassium: 4 mEq/L (ref 3.5–5.1)
Sodium: 137 mEq/L (ref 135–145)
Total Bilirubin: 0.6 mg/dL (ref 0.2–1.2)
Total Protein: 7.4 g/dL (ref 6.0–8.3)

## 2021-01-24 LAB — MAGNESIUM: Magnesium: 1.8 mg/dL (ref 1.5–2.5)

## 2021-01-24 LAB — TSH: TSH: 1.29 u[IU]/mL (ref 0.35–5.50)

## 2021-01-24 MED ORDER — OMEPRAZOLE 20 MG PO CPDR: 20.0000 mg | DELAYED_RELEASE_CAPSULE | Freq: Every day | ORAL | 11 refills | Status: DC

## 2021-01-24 NOTE — Progress Notes (Signed)
    History of Present Illness: This is a 50 year old male with a history of GERD with worsening reflux symptoms.  He relates that taking omeprazole 40 mg daily pantoprazole 40 mg daily or famotidine for only 40 mg daily causes worsening of palpitations and when he discontinues the medications his palpitations resolved.  In the past omeprazole 20 mg daily did not cause palpitations.  He has intermittent flares of reflux symptoms that are difficult to control without a prescription medication.  Current Medications, Allergies, Past Medical History, Past Surgical History, Family History and Social History were reviewed in [employer] record.   Physical Exam: General: Well developed, well nourished, no acute distress Head: Normocephalic and atraumatic Eyes: Sclerae anicteric, EOMI Ears: Normal auditory acuity Mouth: Not examined, mask on during Covid-19 pandemic Lungs: Clear throughout to auscultation Heart: Regular rate and rhythm; no murmurs, rubs or bruits Abdomen: Soft, non tender and non distended. No masses, hepatosplenomegaly or hernias noted. Normal Bowel sounds Rectal: Not done Musculoskeletal: Symmetrical with no gross deformities  Pulses:  Normal pulses noted Extremities: No clubbing, cyanosis, edema or deformities noted Neurological: Alert oriented x 4, grossly nonfocal Psychological:  Alert and cooperative. Normal mood and affect   Assessment and Recommendations:  GERD, history of an esophageal stricture, gastritis.  His reflux symptoms are currently active.  He relates worsening palpitations with omeprazole 40 mg, pantoprazole 40 mg and famotidine 40 mg.  Since he has tolerated omeprazole 20 mg daily in the past we will resume.  Advised also use Tums as needed if okayed by his urologist and PCP.  CBC, CMP, TSH, Mg today.  If he remains on a PPI we will obtain a BMP and Mg in a couple weeks.

## 2021-01-24 NOTE — Patient Instructions (Signed)
Your provider has requested that you go to the basement level for lab work before leaving today. Press "B" on the elevator. The lab is located at the first door on the left as you exit the elevator.  We have sent the following medications to your pharmacy for you to pick up at your convenience: omeprazole 20 mg daily.   If this medication does not work or you start to have palpitations then contact our office.   Due to recent changes in healthcare laws, you may see the results of your imaging and laboratory studies on MyChart before your provider has had a chance to review them.  We understand that in some cases there may be results that are confusing or concerning to you. Not all laboratory results come back in the same time frame and the provider may be waiting for multiple results in order to interpret others.  Please give [place] 48 hours in order for your provider to thoroughly review all the results before contacting the office for clarification of your results.   The [facility] providers would like to encourage you to use [facility] to communicate with providers for non-urgent requests or questions.  Due to long hold times on the telephone, sending your provider a message by [facility] may be a faster and more efficient way to get a response.  Please allow 48 business hours for a response.  Please remember that this is for non-urgent requests.   Thank you for choosing me and [facility].  Pricilla Riffle. Dagoberto Ligas., MD., Marval Regal

## 2021-04-26 ENCOUNTER — Encounter (HOSPITAL_BASED_OUTPATIENT_CLINIC_OR_DEPARTMENT_OTHER): Payer: Self-pay

## 2021-04-26 ENCOUNTER — Emergency Department (HOSPITAL_BASED_OUTPATIENT_CLINIC_OR_DEPARTMENT_OTHER)
Admission: EM | Admit: 2021-04-26 | Discharge: 2021-04-27 | Disposition: A | Discharge status: Home / Self Care | Payer: Medicare Other | Attending: Emergency Medicine | Admitting: Emergency Medicine

## 2021-04-26 ENCOUNTER — Other Ambulatory Visit: Payer: Self-pay

## 2021-04-26 ENCOUNTER — Emergency Department (HOSPITAL_BASED_OUTPATIENT_CLINIC_OR_DEPARTMENT_OTHER): Payer: Medicare Other

## 2021-04-26 DIAGNOSIS — I4891 Unspecified atrial fibrillation: Secondary | ICD-10-CM | POA: Insufficient documentation

## 2021-04-26 DIAGNOSIS — R112 Nausea with vomiting, unspecified: Secondary | ICD-10-CM

## 2021-04-26 DIAGNOSIS — Z28310 Unvaccinated for covid-19: Secondary | ICD-10-CM | POA: Diagnosis not present

## 2021-04-26 DIAGNOSIS — U071 COVID-19: Secondary | ICD-10-CM | POA: Diagnosis not present

## 2021-04-26 HISTORY — DX: Unspecified atrial fibrillation: I48.91

## 2021-04-26 LAB — CBC
HCT: 41.7 % (ref 39.0–52.0)
Hemoglobin: 13.8 g/dL (ref 13.0–17.0)
MCH: 29.5 pg (ref 26.0–34.0)
MCHC: 33.1 g/dL (ref 30.0–36.0)
MCV: 89.1 fL (ref 80.0–100.0)
Platelets: 181 10*3/uL (ref 150–400)
RBC: 4.68 MIL/uL (ref 4.22–5.81)
RDW: 12.9 % (ref 11.5–15.5)
WBC: 3.7 10*3/uL — ABNORMAL LOW (ref 4.0–10.5)
nRBC: 0 % (ref 0.0–0.2)

## 2021-04-26 LAB — BASIC METABOLIC PANEL
Anion gap: 10 (ref 5–15)
BUN: 14 mg/dL (ref 6–20)
CO2: 21 mmol/L — ABNORMAL LOW (ref 22–32)
Calcium: 9 mg/dL (ref 8.9–10.3)
Chloride: 106 mmol/L (ref 98–111)
Creatinine, Ser: 0.96 mg/dL (ref 0.61–1.24)
GFR, Estimated: >60 mL/min (ref >60)
Glucose, Bld: 114 mg/dL — ABNORMAL HIGH (ref 70–99)
Potassium: 3.8 mmol/L (ref 3.5–5.1)
Sodium: 137 mmol/L (ref 135–145)

## 2021-04-26 LAB — TROPONIN I (HIGH SENSITIVITY)
Troponin I (High Sensitivity): 4 ng/L (ref <18)
Troponin I (High Sensitivity): 3 ng/L (ref <18)

## 2021-04-26 MED ORDER — SODIUM CHLORIDE 0.9 % IV BOLUS
500.0000 mL | Freq: Once | INTRAVENOUS | Status: AC
  Administered 2021-04-27: 500 mL via INTRAVENOUS

## 2021-04-26 MED ORDER — ACETAMINOPHEN 500 MG PO TABS
1000.0000 mg | ORAL_TABLET | Freq: Once | ORAL | Status: AC
  Administered 2021-04-27: 1000 mg via ORAL
  Filled 2021-04-26: qty 2

## 2021-04-26 MED ORDER — ONDANSETRON 4 MG PO TBDP
8.0000 mg | ORAL_TABLET | Freq: Once | ORAL | Status: AC
  Filled 2021-04-26: qty 2

## 2021-04-26 MED ORDER — IBUPROFEN 800 MG PO TABS
800.0000 mg | ORAL_TABLET | Freq: Once | ORAL | Status: AC
  Administered 2021-04-27: 800 mg via ORAL
  Filled 2021-04-26: qty 1

## 2021-04-26 MED ORDER — ONDANSETRON 4 MG PO TBDP
ORAL_TABLET | ORAL | Status: AC
  Administered 2021-04-26: 8 mg via ORAL
  Filled 2021-04-26: qty 1

## 2021-04-26 NOTE — ED Triage Notes (Signed)
Headache, body aches, sore throat, with chest pressure and tightness with nausea and vomiting.

## 2021-04-26 NOTE — ED Triage Notes (Signed)
Diagnosed with COVID today at urgent care

## 2021-04-27 ENCOUNTER — Encounter (HOSPITAL_BASED_OUTPATIENT_CLINIC_OR_DEPARTMENT_OTHER): Payer: Self-pay | Admitting: Emergency Medicine

## 2021-04-27 MED ORDER — MOLNUPIRAVIR EUA 200MG CAPSULE
4.0000 | ORAL_CAPSULE | Freq: Two times a day (BID) | ORAL | 0 refills | Status: AC
Start: 2021-04-27 — End: 2021-05-02

## 2021-04-27 MED ORDER — ONDANSETRON 4 MG PO TBDP: ORAL_TABLET | ORAL | 0 refills | Status: DC
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
## 2021-04-27 NOTE — ED Notes (Signed)
Water provided, pt tolerated well

## 2021-10-23 IMAGING — RF DG ESOPHAGUS
8 series · 20 of 24 positions shown · non-contrast
Comparison: None.

CLINICAL DATA: Throat fullness.  Chest discomfort when swallowing.

EXAM:
ESOPHOGRAM / BARIUM SWALLOW / BARIUM TABLET STUDY
TECHNIQUE: Combined double contrast and single contrast examination performed
using effervescent crystals, thick barium liquid, and thin barium
liquid. The patient was observed with fluoroscopy swallowing a 13 mm
barium sulphate tablet.
FLUOROSCOPY TIME:  Fluoroscopy Time:  1 minutes and 36 seconds
Radiation Exposure Index (if provided by the fluoroscopic device):
15.2 mGy
Number of Acquired Spot Images: 0

[Series 1: cp_standard · 0.52mm/px · 2 of 48 frames shown (1 of 8)]
[frame 8/48]
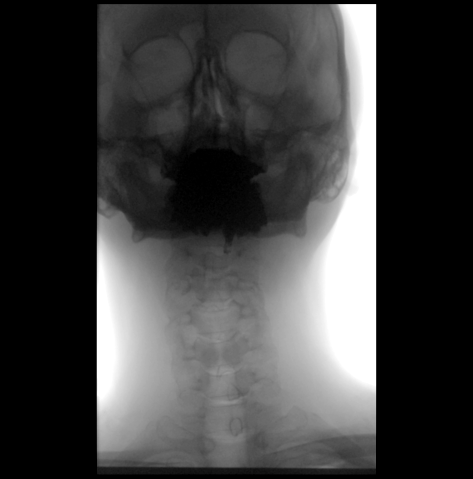
[frame 25/48]
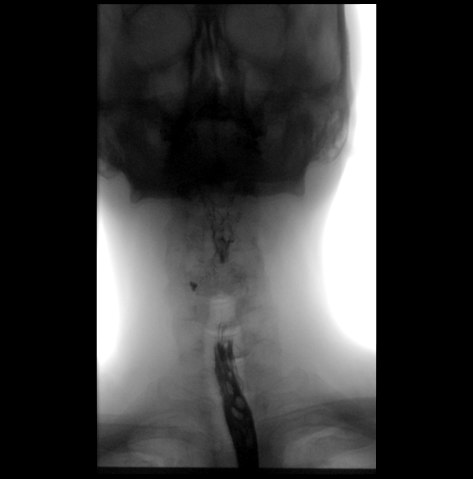

[Series 2: cp_standard · 0.52mm/px · 4 of 38 frames shown (2 of 8)]
[frame 6/38]
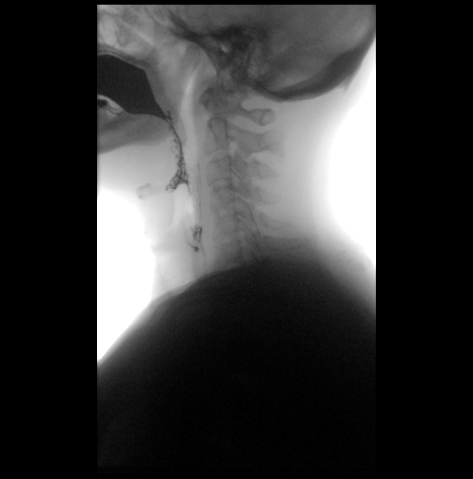
[frame 16/38]
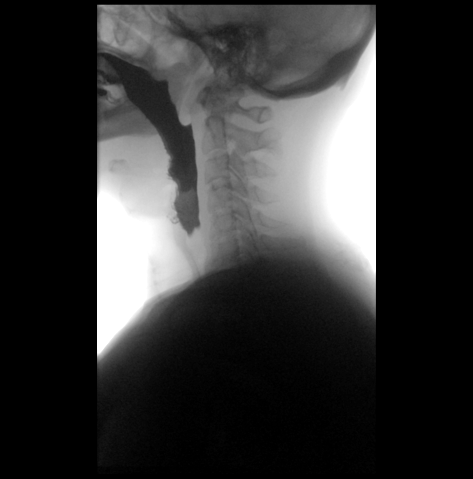
[frame 20/38]
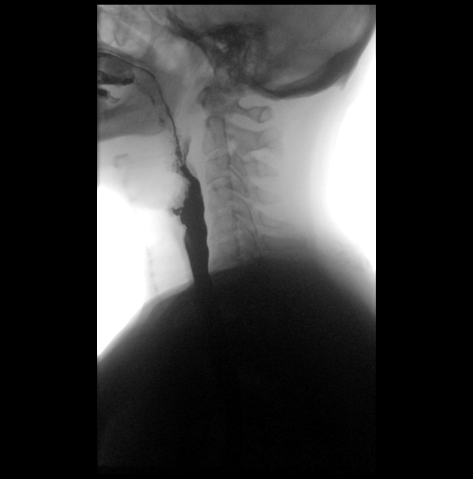
[frame 33/38]
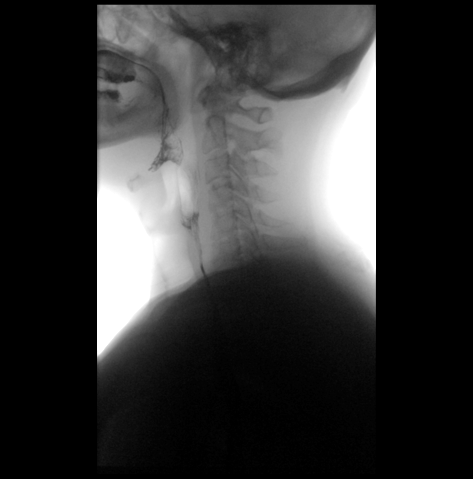

[Series 3: cp_standard · 0.52mm/px · 2 of 114 frames shown (3 of 8)]
[frame 31/114]
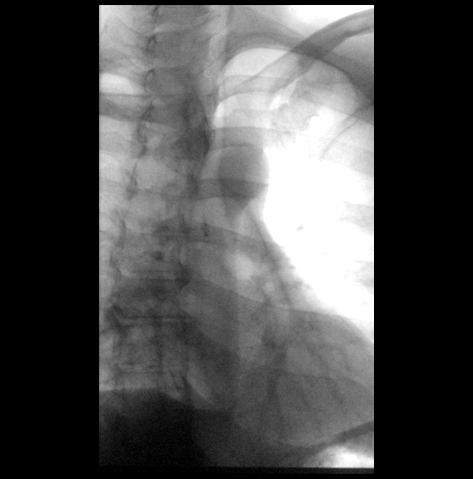
[frame 97/114]
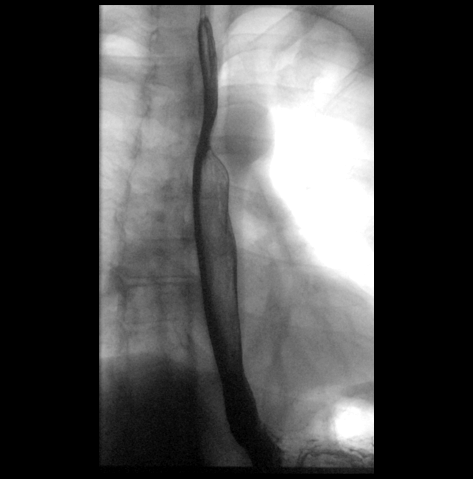

[Series 4: cp_standard · 0.52mm/px · 3 of 137 frames shown (4 of 8)]
[frame 21/137]
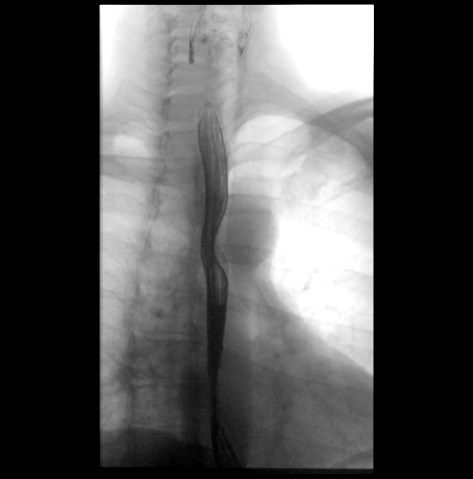
[frame 38/137]
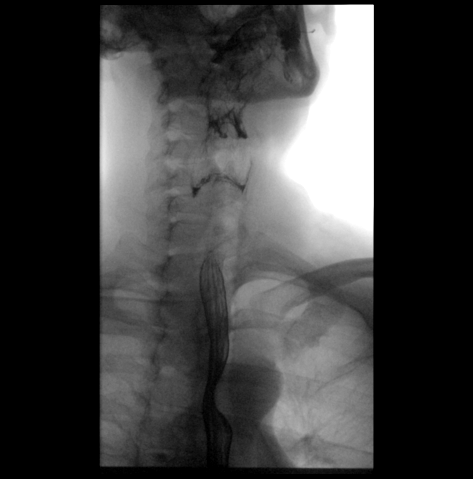
[frame 117/137]
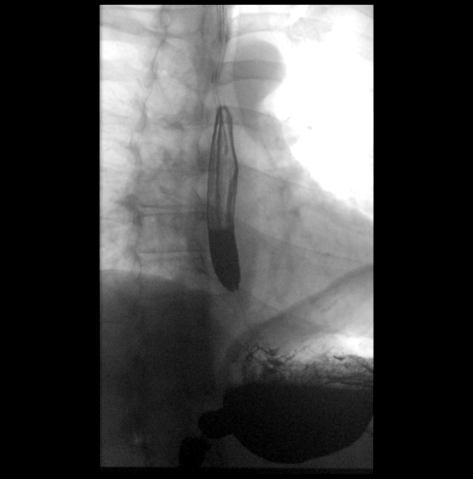

[Series 5: cp_standard · 0.55mm/px · 3 of 186 frames shown (5 of 8)]
[frame 28/186]
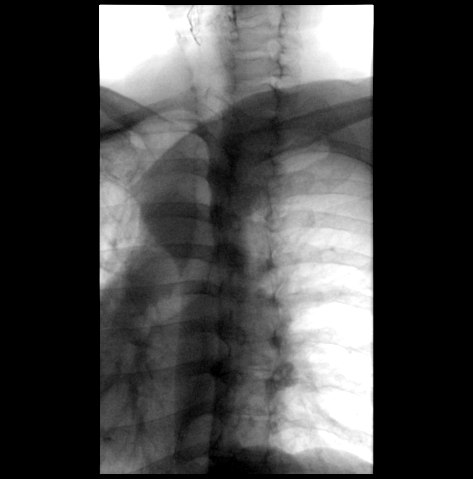
[frame 159/186]
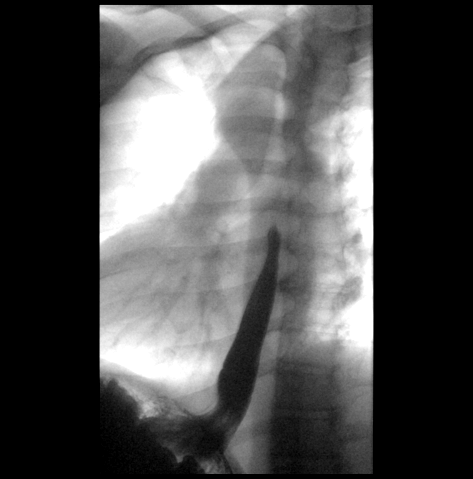
[frame 186/186]
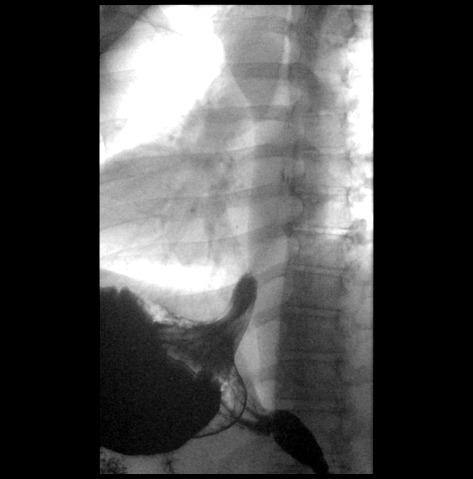

[Series 6: cp_standard · 0.55mm/px · 3 of 213 frames shown (6 of 8)]
[frame 32/213]
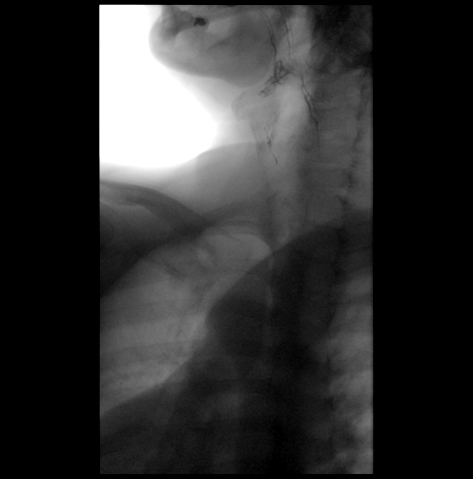
[frame 107/213]
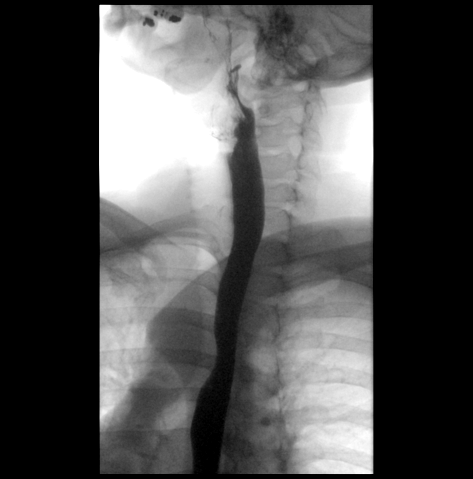
[frame 182/213]
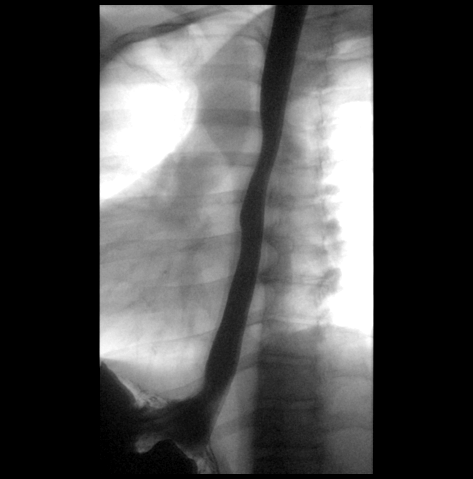

[Series 7: cp_standard · 0.56mm/px · 2 of 110 frames shown (7 of 8)]
[frame 94/110]
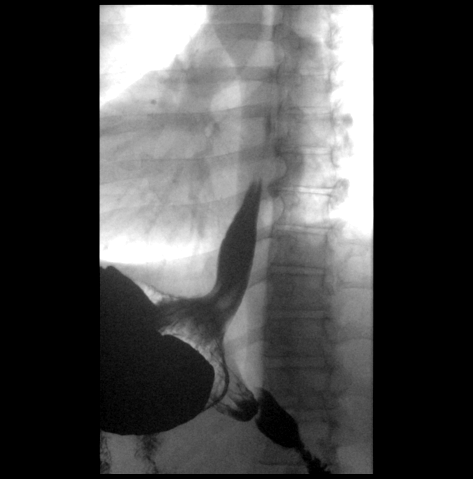
[frame 110/110]
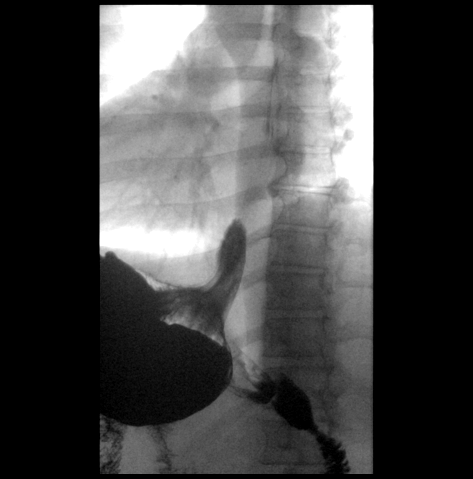

[Series 8: cp_standard · 0.28mm/px · 1 of 1 slices shown (8 of 8)]
[im 1/1]
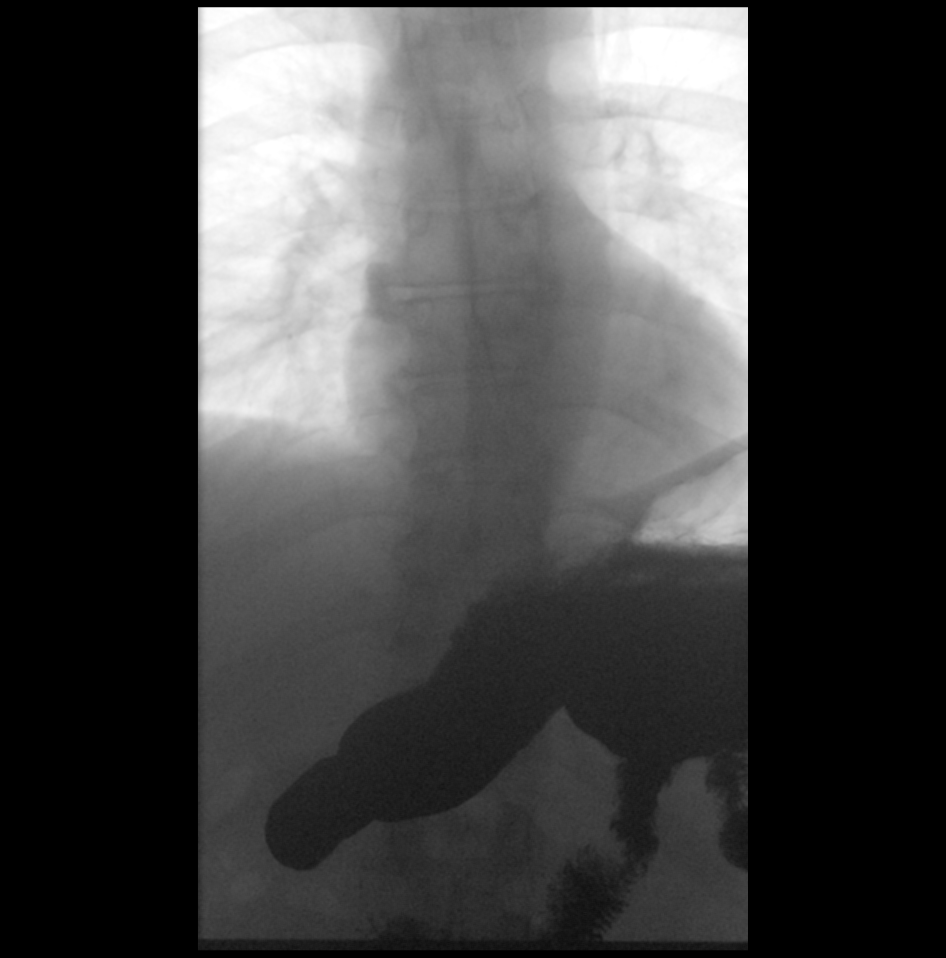

[20 of 24 positions shown; findings below may reference images not displayed]

FINDINGS: Hypopharyngeal portion of the exam is unremarkable.

Double contrast evaluation of the esophagus demonstrates no mucosal
abnormality.

Evaluation of primary peristalsis demonstrates a normal primary
peristaltic wave.

Full column evaluation of the esophagus demonstrates a tiny hiatal
hernia. No persistent narrowing to suggest stricture.

A 13 mm barium tablet passes promptly.
IMPRESSION: 1. Tiny hiatal hernia.
2. No other explanation for patient's symptoms.

## 2021-10-24 ENCOUNTER — Ambulatory Visit: Payer: Medicare Other | Admitting: Cardiology

## 2021-10-24 ENCOUNTER — Ambulatory Visit (INDEPENDENT_AMBULATORY_CARE_PROVIDER_SITE_OTHER): Payer: Medicare Other

## 2021-10-24 ENCOUNTER — Encounter: Payer: Self-pay | Admitting: Cardiology

## 2021-10-24 VITALS — BP 108/78 | HR 54 | Ht 69.0 in | Wt 226.0 lb

## 2021-10-24 DIAGNOSIS — Z79899 Other long term (current) drug therapy: Secondary | ICD-10-CM

## 2021-10-24 DIAGNOSIS — I480 Paroxysmal atrial fibrillation: Secondary | ICD-10-CM
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
## 2021-10-24 NOTE — Patient Instructions (Addendum)
Medication Instructions:  Stop Metoprolol  *If you need a refill on your cardiac medications before your next appointment, please call your pharmacy*   Lab Work:  If you have labs (blood work) drawn today and your tests are completely normal, you will receive your results only by: [place] (if you have MyChart) OR A paper copy in the mail If you have any lab test that is abnormal or we need to change your treatment, we will call you to review the results.   Testing/Procedures: [place]- Long Term Monitor Instructions  Your physician has requested you wear a ZIO patch monitor for 14 days.  This is a single patch monitor. [employer] supplies one patch monitor per enrollment. Additional stickers are not available. Please do not apply patch if you will be having a Nuclear Stress Test,  Echocardiogram, Cardiac CT, MRI, or Chest Xray during the period you would be wearing the  monitor. The patch cannot be worn during these tests. You cannot remove and re-apply the  ZIO XT patch monitor.  Your ZIO patch monitor will be mailed 3 day USPS to your address on file. It may take 3-5 days  to receive your monitor after you have been enrolled.  Once you have received your monitor, please review the enclosed instructions. Your monitor  has already been registered assigning a specific monitor serial # to you.  Billing and Patient Assistance Program Information  We have supplied [employer] with any of your insurance information on file for billing purposes. [employer] offers a sliding scale Patient Assistance Program for patients that do not have  insurance, or whose insurance does not completely cover the cost of the ZIO monitor.  You must apply for the Patient Assistance Program to qualify for this discounted rate.  To apply, please call [employer] at [phone], select option 4, select option 2, ask to apply for  Patient Assistance Program. [name] will ask your household income, and how many people   are in your household. They will quote your out-of-pocket cost based on that information.  [employer] will also be able to set up a 79-month interest-free payment plan if needed.  Applying the monitor   Shave hair from upper left chest.  Hold abrader disc by orange tab. Rub abrader in 40 strokes over the upper left chest as  indicated in your monitor instructions.  Clean area with 4 enclosed alcohol pads. Let dry.  Apply patch as indicated in monitor instructions. Patch will be placed under collarbone on left  side of chest with arrow pointing upward.  Rub patch adhesive wings for 2 minutes. Remove white label marked "1". Remove the white  label marked "2". Rub patch adhesive wings for 2 additional minutes.  While looking in a mirror, press and release button in center of patch. A small green light will  flash 3-4 times. This will be your only indicator that the monitor has been turned on.  Do not shower for the first 24 hours. You may shower after the first 24 hours.  Press the button if you feel a symptom. You will hear a small click. Record Date, Time and  Symptom in the Patient Logbook.  When you are ready to remove the patch, follow instructions on the last 2 pages of Patient  Logbook. Stick patch monitor onto the last page of Patient Logbook.  Place Patient Logbook in the blue and white box. Use locking tab on box and tape box closed  securely. The blue and white box  has prepaid postage on it. Please place it in the mailbox as  soon as possible. Your physician should have your test results approximately 7 days after the  monitor has been mailed back to [facility].  Call [name] at [phone] if you have questions regarding  your ZIO XT patch monitor. Call them immediately if you see an orange light blinking on your  monitor.  If your monitor falls off in less than 4 days, contact our Monitor department at [phone].  If your monitor becomes loose or  falls off after 4 days call [employer] at [phone] for  suggestions on securing your monitor    Follow-Up: At [facility], you and your health needs are our priority.  As part of our continuing mission to provide you with exceptional heart care, we have created designated Provider Care Teams.  These Care Teams include your primary Cardiologist (physician) and Advanced Practice Providers (APPs -  Physician Assistants and Nurse Practitioners) who all work together to provide you with the care you need, when you need it.  We recommend signing up for the patient portal called "MyChart".  Sign up information is provided on this After Visit Summary.  MyChart is used to connect with patients for Virtual Visits (Telemedicine).  Patients are able to view lab/test results, encounter notes, upcoming appointments, etc.  Non-urgent messages can be sent to your provider as well.   To learn more about what you can do with MyChart, go to [email or web address].    Your next appointment:   1 year(s)  The format for your next appointment:   In Person  Provider:   Oda Kilts PA or Tommye Standard PA         Other Instructions   Important Information About Sugar

## 2021-10-24 NOTE — Progress Notes (Unsigned)
Enrolled for [employer] to mail a ZIO XT long term holter monitor to the patients address on file.  

## 2021-10-27 ENCOUNTER — Other Ambulatory Visit: Payer: Self-pay | Admitting: Cardiology

## 2021-10-27 DIAGNOSIS — Z79899 Other long term (current) drug therapy: Secondary | ICD-10-CM

## 2021-10-27 DIAGNOSIS — I480 Paroxysmal atrial fibrillation: Secondary | ICD-10-CM

## 2022-04-11 ENCOUNTER — Telehealth: Payer: Self-pay | Admitting: Gastroenterology

## 2022-04-11 ENCOUNTER — Other Ambulatory Visit: Payer: Self-pay | Admitting: Gastroenterology

## 2022-04-11 MED ORDER — OMEPRAZOLE 20 MG PO CPDR: 20.0000 mg | DELAYED_RELEASE_CAPSULE | Freq: Every day | ORAL | 0 refills | Status: DC

## 2022-04-11 NOTE — Telephone Encounter (Signed)
Patient is requesting refill on omeprazole until his appt with Dr.Stark on 12/13. Please advise.

## 2022-04-11 NOTE — Telephone Encounter (Signed)
Prescription sent to patient's pharmacy until scheduled appt. 

## 2022-05-07 ENCOUNTER — Telehealth: Payer: Self-pay | Admitting: Gastroenterology

## 2022-05-07 NOTE — Telephone Encounter (Signed)
Inbound call from patient requesting to reschedule his appt that was 12/13 due to a work conflict. I advised him of Dr.Starks next available as well as APP appt which were in January. Patient states he can not wait that long for an appt and is requesting a call back.

## 2022-05-08 NOTE — Telephone Encounter (Signed)
Patient has been experiencing occasional upper abdominal pain & RUQ discomfort for the last month. States he has some pressure at the bottom of his esophagus & tender to the touch at times. Has history of hiatal hernia. Currently on omeprazole 20 mg QD. Rescheduled OV to 05/16/22 at 1:30 pm with Jaclyn Shaggy, NP to discuss further.

## 2022-05-09 ENCOUNTER — Ambulatory Visit: Payer: Medicare Other | Admitting: Gastroenterology

## 2022-05-16 ENCOUNTER — Other Ambulatory Visit: Payer: Self-pay | Admitting: Nurse Practitioner

## 2022-05-16 ENCOUNTER — Ambulatory Visit: Payer: Medicare Other | Admitting: Nurse Practitioner

## 2022-05-16 ENCOUNTER — Encounter: Payer: Self-pay | Admitting: Nurse Practitioner

## 2022-05-16 VITALS — BP 126/84 | HR 79 | Ht 69.0 in | Wt 229.0 lb

## 2022-05-16 DIAGNOSIS — R1011 Right upper quadrant pain: Secondary | ICD-10-CM | POA: Diagnosis not present

## 2022-05-16 DIAGNOSIS — R1013 Epigastric pain: Secondary | ICD-10-CM

## 2022-05-16 DIAGNOSIS — Z86010 Personal history of colonic polyps: Secondary | ICD-10-CM

## 2022-05-16 MED ORDER — NA SULFATE-K SULFATE-MG SULF 17.5-3.13-1.6 GM/177ML PO SOLN: 1.0000 | Freq: Once | ORAL | 0 refills | Status: AC

## 2022-05-16 MED ORDER — OMEPRAZOLE 40 MG PO CPDR: 40.0000 mg | DELAYED_RELEASE_CAPSULE | Freq: Every day | ORAL | 1 refills | Status: DC
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
## 2022-05-16 NOTE — Patient Instructions (Signed)
You have been scheduled for an endoscopy and colonoscopy. Please follow the written instructions given to you at your visit today. Please pick up your prep supplies at the pharmacy within the next 1-3 days. If you use inhalers (even only as needed), please bring them with you on the day of your procedure.   Gaviscon (over the counter)- 1 tablespoon 3 times daily as needed  We have sent the following medications to your pharmacy for you to pick up at your convenience: Omeprazole 40 mg  Schedule the abdominal sono as ordered by your primary care doctor.  Due to recent changes in healthcare laws, you may see the results of your imaging and laboratory studies on MyChart before your provider has had a chance to review them.  We understand that in some cases there may be results that are confusing or concerning to you. Not all laboratory results come back in the same time frame and the provider may be waiting for multiple results in order to interpret others.  Please give [place] 48 hours in order for your provider to thoroughly review all the results before contacting the office for clarification of your results.   Thank you for trusting me with your gastrointestinal care!   Carl Best, CRNP

## 2022-05-17 ENCOUNTER — Ambulatory Visit: Payer: Medicare Other | Admitting: Gastroenterology

## 2022-05-17 ENCOUNTER — Encounter: Payer: Self-pay | Admitting: Nurse Practitioner

## 2022-06-21 ENCOUNTER — Encounter: Payer: Self-pay | Admitting: Gastroenterology

## 2022-06-28 ENCOUNTER — Encounter: Payer: Self-pay | Admitting: Gastroenterology

## 2022-06-28 ENCOUNTER — Ambulatory Visit (AMBULATORY_SURGERY_CENTER): Payer: Medicare Other | Admitting: Gastroenterology

## 2022-06-28 VITALS — BP 118/81 | HR 57 | Temp 96.9°F | Resp 9 | Ht 69.0 in | Wt 229.0 lb

## 2022-06-28 DIAGNOSIS — K222 Esophageal obstruction: Secondary | ICD-10-CM | POA: Diagnosis not present

## 2022-06-28 DIAGNOSIS — Z1211 Encounter for screening for malignant neoplasm of colon: Secondary | ICD-10-CM | POA: Diagnosis not present

## 2022-06-28 DIAGNOSIS — K449 Diaphragmatic hernia without obstruction or gangrene: Secondary | ICD-10-CM

## 2022-06-28 DIAGNOSIS — R1013 Epigastric pain: Secondary | ICD-10-CM | POA: Diagnosis not present

## 2022-06-28 DIAGNOSIS — Z1212 Encounter for screening for malignant neoplasm of rectum: Secondary | ICD-10-CM

## 2022-06-28 MED ORDER — SODIUM CHLORIDE 0.9 % IV SOLN: 500.0000 mL | Freq: Once | INTRAVENOUS | Status: DC
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
## 2022-06-28 NOTE — Op Note (Signed)
[place] Patient Name: [name] Procedure Date: 06/28/2022 9:53 AM MRN: [id number] Endoscopist: Ladene Artist , MD, [id number] Age: 52 Referring MD:  Date of Birth: 10-04-1970 Gender: Male Account #: [id number] Procedure:                Upper GI endoscopy Indications:              Epigastric abdominal pain Medicines:                Monitored Anesthesia Care Procedure:                Pre-Anesthesia Assessment:                           - Prior to the procedure, a History and Physical                            was performed, and patient medications and                            allergies were reviewed. The patient's tolerance of                            previous anesthesia was also reviewed. The risks                            and benefits of the procedure and the sedation                            options and risks were discussed with the patient.                            All questions were answered, and informed consent                            was obtained. Prior Anticoagulants: The patient has                            taken no anticoagulant or antiplatelet agents. ASA                            Grade Assessment: III - A patient with severe                            systemic disease. After reviewing the risks and                            benefits, the patient was deemed in satisfactory                            condition to undergo the procedure.                           After obtaining informed consent, the endoscope was  passed under direct vision. Throughout the                            procedure, the patient's blood pressure, pulse, and                            oxygen saturations were monitored continuously. The                            Olympus Scope [id number] was introduced through the                            mouth, and advanced to the second part of duodenum.                            The upper GI endoscopy  was accomplished without                            difficulty. The patient tolerated the procedure                            well. Scope In: Scope Out: Findings:                 One benign-appearing, intrinsic mild stenosis was                            found at the gastroesophageal junction. This                            stenosis measured 1.5 cm (inner diameter) x less                            than one cm (in length). The stenosis was traversed.                           The exam of the esophagus was otherwise normal.                           A medium-sized hiatal hernia was present.                           The exam of the stomach was otherwise normal.                           The duodenal bulb and second portion of the                            duodenum were normal. Complications:            No immediate complications. Estimated Blood Loss:     Estimated blood loss: none. Impression:               - Benign-appearing non-obstructing esophageal  stenosis.                           - Medium-sized hiatal hernia.                           - Normal duodenal bulb and second portion of the                            duodenum.                           - No specimens collected. Recommendation:           - Patient has a contact number available for                            emergencies. The signs and symptoms of potential                            delayed complications were discussed with the                            patient. Return to normal activities tomorrow.                            Written discharge instructions were provided to the                            patient.                           - Resume previous diet.                           - Follow antireflux measures long term.                           - Continue present medications. Ladene Artist, MD 06/28/2022 10:23:42 AM This report has been signed electronically.

## 2022-06-28 NOTE — Op Note (Signed)
[facility] Patient Name: [name] Procedure Date: 06/28/2022 9:53 AM MRN: [id number] Endoscopist: Ladene Artist , MD, [id number] Age: 52 Referring MD:  Date of Birth: [birth year] Gender: Male Account #: [id number] Procedure:                Colonoscopy Indications:              Screening for colorectal malignant neoplasm Medicines:                Monitored Anesthesia Care Procedure:                Pre-Anesthesia Assessment:                           - Prior to the procedure, a History and Physical                            was performed, and patient medications and                            allergies were reviewed. The patient's tolerance of                            previous anesthesia was also reviewed. The risks                            and benefits of the procedure and the sedation                            options and risks were discussed with the patient.                            All questions were answered, and informed consent                            was obtained. Prior Anticoagulants: The patient has                            taken no anticoagulant or antiplatelet agents. ASA                            Grade Assessment: III - A patient with severe                            systemic disease. After reviewing the risks and                            benefits, the patient was deemed in satisfactory                            condition to undergo the procedure.                           After obtaining informed consent, the colonoscope  was passed under direct vision. Throughout the                            procedure, the patient's blood pressure, pulse, and                            oxygen saturations were monitored continuously. The                            Olympus SN [id number] was introduced through the anus                            and advanced to the the cecum, identified by                            appendiceal  orifice and ileocecal valve. The                            ileocecal valve, appendiceal orifice, and rectum                            were photographed. The quality of the bowel                            preparation was good. The colonoscopy was performed                            without difficulty. The patient tolerated the                            procedure well. Scope In: 9:59:15 AM Scope Out: 10:11:18 AM Scope Withdrawal Time: 0 hours 10 minutes 9 seconds  Total Procedure Duration: 0 hours 12 minutes 3 seconds  Findings:                 The perianal and digital rectal examinations were                            normal.                           Multiple medium-mouthed diverticula were found in                            the left colon. There was evidence of diverticular                            spasm. There was no evidence of diverticular                            bleeding.                           Internal hemorrhoids were found during  retroflexion. The hemorrhoids were small and Grade                            I (internal hemorrhoids that do not prolapse).                           The exam was otherwise without abnormality on                            direct and retroflexion views. Complications:            No immediate complications. Estimated blood loss:                            None. Estimated Blood Loss:     Estimated blood loss: none. Impression:               - Moderate diverticulosis in the left colon.                           - Internal hemorrhoids.                           - The examination was otherwise normal on direct                            and retroflexion views.                           - No specimens collected. Recommendation:           - Repeat colonoscopy in 10 years for screening                            purposes.                           - Patient has a contact number available for                             emergencies. The signs and symptoms of potential                            delayed complications were discussed with the                            patient. Return to normal activities tomorrow.                            Written discharge instructions were provided to the                            patient.                           - High fiber diet.                           -  Continue present medications. Ladene Artist, MD 06/28/2022 10:14:48 AM This report has been signed electronically.

## 2022-06-28 NOTE — Progress Notes (Signed)
Sedate, gd SR, tolerated procedure well, VSS, report to RN 

## 2022-06-28 NOTE — Patient Instructions (Addendum)
- Repeat colonoscopy in 10 years for screening                            purposes.                           - Patient has a contact number available for                            emergencies. The signs and symptoms of potential                            delayed complications were discussed with the                            patient. Return to normal activities tomorrow.                            Written discharge instructions were provided to the                            patient.                           - High fiber diet.                           - Continue present medications.                           - Follow antireflux measures long term.  YOU HAD AN ENDOSCOPIC PROCEDURE TODAY AT [facility]:   Refer to the procedure report that was given to you for any specific questions about what was found during the examination.  If the procedure report does not answer your questions, please call your gastroenterologist to clarify.  If you requested that your care partner not be given the details of your procedure findings, then the procedure report has been included in a sealed envelope for you to review at your convenience later.  YOU SHOULD EXPECT: Some feelings of bloating in the abdomen. Passage of more gas than usual.  Walking can help get rid of the air that was put into your GI tract during the procedure and reduce the bloating. If you had a lower endoscopy (such as a colonoscopy or flexible sigmoidoscopy) you may notice spotting of blood in your stool or on the toilet paper. If you underwent a bowel prep for your procedure, you may not have a normal bowel movement for a few days.  Please Note:  You might notice some irritation and congestion in your nose or some drainage.  This is from the oxygen used during your procedure.  There is no need for concern and it should clear up in a day or so.  SYMPTOMS TO REPORT  IMMEDIATELY:  Following lower endoscopy (colonoscopy or flexible sigmoidoscopy):  Excessive amounts of blood in the stool  Significant tenderness or worsening of abdominal pains  Swelling of the abdomen that is new, acute  Fever of 100F or higher  Following upper endoscopy (EGD)  Vomiting of blood or coffee  ground material  New chest pain or pain under the shoulder blades  Painful or persistently difficult swallowing  New shortness of breath  Fever of 100F or higher  Black, tarry-looking stools  For urgent or emergent issues, a gastroenterologist can be reached at any hour by calling [phone]. Do not use MyChart messaging for urgent concerns.    DIET:  We do recommend a small meal at first, but then you may proceed to your regular diet.  Drink plenty of fluids but you should avoid alcoholic beverages for 24 hours.  ACTIVITY:  You should plan to take it easy for the rest of today and you should NOT DRIVE or use heavy machinery until tomorrow (because of the sedation medicines used during the test).    FOLLOW UP: Our staff will call the number listed on your records the next business day following your procedure.  We will call around 7:15- 8:00 am to check on you and address any questions or concerns that you may have regarding the information given to you following your procedure. If we do not reach you, we will leave a message.     If any biopsies were taken you will be contacted by phone or by letter within the next 1-3 weeks.  Please call us at [phone] if you have not heard about the biopsies in 3 weeks.    SIGNATURES/CONFIDENTIALITY: You and/or your care partner have signed paperwork which will be entered into your electronic medical record.  These signatures attest to the fact that that the information above on your After Visit Summary has been reviewed and is understood.  Full responsibility of the confidentiality of this discharge information lies with you and/or  your care-partner.

## 2022-06-29 ENCOUNTER — Telehealth: Payer: Self-pay

## 2022-06-29 NOTE — Telephone Encounter (Signed)
  Follow up Call-     06/28/2022    9:20 AM  Call back number  Post procedure Call Back phone  # [phone]  Permission to leave phone message Yes     Patient questions:  Do you have a fever, pain , or abdominal swelling? No. Pain Score  0 *  Have you tolerated food without any problems? Yes.    Have you been able to return to your normal activities? Yes.    Do you have any questions about your discharge instructions: Diet   No. Medications  No. Follow up visit  No.  Do you have questions or concerns about your Care? No.  Actions: * If pain score is 4 or above: No action needed, pain <4.

## 2022-07-04 ENCOUNTER — Ambulatory Visit: Payer: Medicare Other | Admitting: Orthopaedic Surgery

## 2022-07-18 ENCOUNTER — Other Ambulatory Visit: Payer: Self-pay | Admitting: Gastroenterology

## 2022-07-24 ENCOUNTER — Other Ambulatory Visit: Payer: Self-pay | Admitting: Nurse Practitioner

## 2022-07-24 ENCOUNTER — Ambulatory Visit: Payer: Medicare Other | Admitting: Orthopaedic Surgery

## 2022-07-24 ENCOUNTER — Ambulatory Visit (INDEPENDENT_AMBULATORY_CARE_PROVIDER_SITE_OTHER): Payer: Medicare Other

## 2022-07-24 ENCOUNTER — Encounter: Payer: Self-pay | Admitting: Orthopaedic Surgery

## 2022-07-24 DIAGNOSIS — M79645 Pain in left finger(s): Secondary | ICD-10-CM

## 2022-07-24 NOTE — Telephone Encounter (Signed)
Please advise 

## 2022-07-24 NOTE — Progress Notes (Signed)
HPI: [name] comes in today with left ring finger pain.  He hyperextended the finger when bowling 2 months ago.  He states he was bowling twisted the finger hyperextended the finger.  He felt a pop in the finger and stinging sensation.  He states that it he is really sore in the morning and has difficulty flexing the finger with the finger does not get stuck down.  He does have a trigger finger of the right middle finger that has had this for 5 years.  He states it really does not bother him much.  He notes popping occasionally in the left ring finger.   Review of systems: See HPI otherwise negative.  General: Well-developed well-nourished male no acute distress mood affect appropriate. Psych: Alert and oriented x 3. Bilateral hands full range of motion bilateral hands.  Full sensation.  Right trigger finger.  Left ring finger popping soreness over the A1 pulley up into the most proximal digit only volar aspect.  No tenderness throughout the left hand dorsal aspect.  There is no area of ecchymosis erythema.  There is slight edema of the volar aspect of the hand.  As full flexion of all the fingers both hands.  Isolation of each of the left hand digits from the distal to the proximal phalanx shows 5 out of 5 strength against resistance but does cause some discomfort.  Maximal tenderness is over the A1 pulley of the left index finger.   Radiographs: 3 views left ring finger: No acute fractures.  No significant arthritic changes.  No subluxations dislocations.  No acute findings.  Impression: Left ring finger pain  Plan: Will have him apply Voltaren gel over the volar aspect of the left ring finger A1 pulley region.  Will see him back in 1 month to see how he is progressing.  May consider injection at that time if he continues to have pain.  Discussed with him that he can also use warm water or paraffin to work on range of motion of the hand.  Questions were encouraged and answered at length.  In regards to  the right middle trigger finger he does not wish to have anything done at this point in time due to the fact that it really does not bother him.

## 2022-07-27 ENCOUNTER — Other Ambulatory Visit: Payer: Self-pay | Admitting: Nurse Practitioner

## 2022-08-22 ENCOUNTER — Ambulatory Visit: Payer: Medicare Other | Admitting: Orthopaedic Surgery

## 2022-09-16 ENCOUNTER — Other Ambulatory Visit: Payer: Self-pay | Admitting: Gastroenterology

## 2022-09-18 ENCOUNTER — Telehealth: Payer: Self-pay | Admitting: Gastroenterology

## 2022-09-18 MED ORDER — OMEPRAZOLE 20 MG PO CPDR: DELAYED_RELEASE_CAPSULE | ORAL | 3 refills | Status: AC

## 2022-09-18 NOTE — Telephone Encounter (Signed)
Informed patient that omeprazole 40 mg was sent in error and we will send omeprazole 20 mg daily for 90 day supply. Patient verbalized understanding.

## 2022-09-18 NOTE — Telephone Encounter (Signed)
Inbound call from patient, states the pharmacy is stating they cannot refill his Omeprazole 20 MG is requesting a refill. States if it could be sent to [facility] on [address].

## 2022-09-19 ENCOUNTER — Ambulatory Visit: Payer: Medicare Other | Admitting: Orthopaedic Surgery
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
## 2022-10-23 ENCOUNTER — Ambulatory Visit: Payer: Medicare Other | Admitting: Student

## 2022-11-21 NOTE — Progress Notes (Unsigned)
  Electrophysiology Office Note:   Date:  11/22/2022  ID:  [name], DOB [birth year], MRN [id number]  Primary Cardiologist: None Electrophysiologist: Will Jorja Loa, MD      History of Present Illness:   [name] is a 52 y.o. male with h/o PAF, Obesity, OSA, and palpitations seen today for routine electrophysiology followup.   Since last being seen in our clinic the patient reports doing very well from a cardiac perspective. He does have rare, singular palpitations as well as even more rarely, short runs of tachy-palpitations. Usually related to eating or lying in a certain position. he denies chest pain, dyspnea, PND, orthopnea, nausea, vomiting, dizziness, syncope, edema, weight gain, or early satiety.   Review of systems complete and found to be negative unless listed in HPI.   Studies Reviewed:    EKG is ordered today. Personal review as below.  EKG Interpretation  Date/Time:  Thursday November 22 2022 08:50:17 EDT Ventricular Rate:  53 PR Interval:  192 QRS Duration: 88 QT Interval:  400 QTC Calculation: 375 R Axis:   47 Text Interpretation: Sinus bradycardia Stable from prior Confirmed by Maxine Glenn [phone]) on 11/22/2022 9:05:26 AM        Physical Exam:   VS:  BP 118/76   Pulse (!) 58   Ht 5\' 9"  (1.753 m)   Wt 221 lb 6.4 oz (100.4 kg)   SpO2 98%   BMI 32.70 kg/m    Wt Readings from Last 3 Encounters:  11/22/22 221 lb 6.4 oz (100.4 kg)  06/28/22 229 lb (103.9 kg)  05/16/22 229 lb (103.9 kg)     GEN: Well nourished, well developed in no acute distress NECK: No JVD; No carotid bruits CARDIAC: Regular rate and rhythm, no murmurs, rubs, gallops RESPIRATORY:  Clear to auscultation without rales, wheezing or rhonchi  ABDOMEN: Soft, non-tender, non-distended EXTREMITIES:  No edema; No deformity   ASSESSMENT AND PLAN:    PAF EKG today shows NSR with stable intervals Not currently on Xarelto with CHA2DS2VASc or 0  Palpitations Sound like  PACs/PVCs mostly, with occasional 2-3 seconds of "sustained" tachy-palpitations.  If burden worsens would repeat monitor Prior monitor with rare (<1%) PACs and PVCs  Obesity Body mass index is 32.7 kg/m.  Encouraged lifestyle modification   OSA  Encouraged nightly CPAP   Follow up with Dr. Elberta Fortis in 6 months  Signed, Graciella Freer, PA-C

## 2022-11-22 ENCOUNTER — Encounter: Payer: Self-pay | Admitting: Student

## 2022-11-22 ENCOUNTER — Ambulatory Visit: Payer: Medicare Other | Attending: Student | Admitting: Student

## 2022-11-22 VITALS — BP 118/76 | HR 58 | Ht 69.0 in | Wt 221.4 lb

## 2022-11-22 DIAGNOSIS — I480 Paroxysmal atrial fibrillation: Secondary | ICD-10-CM | POA: Diagnosis not present

## 2022-11-22 DIAGNOSIS — G4733 Obstructive sleep apnea (adult) (pediatric): Secondary | ICD-10-CM | POA: Diagnosis not present

## 2022-11-22 DIAGNOSIS — R002 Palpitations: Secondary | ICD-10-CM | POA: Diagnosis not present

## 2022-11-22 DIAGNOSIS — Z79899 Other long term (current) drug therapy: Secondary | ICD-10-CM

## 2022-11-22 NOTE — Patient Instructions (Signed)
Medication Instructions:  Your physician recommends that you continue on your current medications as directed. Please refer to the Current Medication list given to you today.  *If you need a refill on your cardiac medications before your next appointment, please call your pharmacy*  Lab Work: None ordered If you have labs (blood work) drawn today and your tests are completely normal, you will receive your results only by: MyChart Message (if you have MyChart) OR A paper copy in the mail If you have any lab test that is abnormal or we need to change your treatment, we will call you to review the results.  Follow-Up: At [facility], you and your health needs are our priority.  As part of our continuing mission to provide you with exceptional heart care, we have created designated Provider Care Teams.  These Care Teams include your primary Cardiologist (physician) and Advanced Practice Providers (APPs -  Physician Assistants and Nurse Practitioners) who all work together to provide you with the care you need, when you need it.  Your next appointment:   6 month(s)  Provider:   Will Camnitz, MD  

## 2023-05-15 ENCOUNTER — Ambulatory Visit: Payer: Medicare Other | Admitting: Cardiology

## 2023-05-22 NOTE — Progress Notes (Signed)
 This encounter was created in error - please disregard.

## 2023-05-23 ENCOUNTER — Ambulatory Visit: Payer: Medicare Other | Admitting: Pulmonary Disease

## 2023-05-23 DIAGNOSIS — I480 Paroxysmal atrial fibrillation: Secondary | ICD-10-CM

## 2023-05-23 DIAGNOSIS — G4733 Obstructive sleep apnea (adult) (pediatric): Secondary | ICD-10-CM

## 2023-05-23 DIAGNOSIS — R002 Palpitations: Secondary | ICD-10-CM

## 2023-05-24 ENCOUNTER — Ambulatory Visit: Payer: Medicare Other | Admitting: Cardiology

## 2023-06-09 NOTE — Progress Notes (Signed)
  Electrophysiology Office Note:   Date:  06/11/2023  ID:  [name], DOB 1971/03/20, MRN [id number]  Primary Cardiologist: None Primary Heart Failure: None Electrophysiologist: [name] Gladis Norton, MD      History of Present Illness:   [name] is a 53 y.o. male with h/o paroxysmal AF s/p ablation 05/2020, OSA, obesity seen today for routine electrophysiology followup.   Since last being seen in our clinic the patient reports he has been doing well overall. He notices occasionally that he [name] have a hard heart beat that feels irregular.  He [name] check his pulse and it is regular.  Does not monitor with any technology.  No known AF.  He previously had very clear symptoms when he had AF > throat fullness, pounding HR, fluttering.   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   He denies chest pain, palpitations, dyspnea, PND, orthopnea, nausea, vomiting, dizziness, syncope, edema, weight gain, or early satiety.   Review of systems complete and found to be negative unless listed in HPI.   EP Information / Studies Reviewed:    EKG is ordered today. Personal review as below.  EKG Interpretation Date/Time:  Tuesday June 11 2023 08:34:29 EST Ventricular Rate:  55 PR Interval:  188 QRS Duration:  88 QT Interval:  386 QTC Calculation: 369 R Axis:   61  Text Interpretation: Sinus bradycardia Confirmed by Aniceto Jarvis ([id number]) on 06/11/2023 8:38:07 AM   Studies:  EPS 05/2020 > PVI ablation & ablation of second discrete focus  Cardiac Monitor 09/2021 > predominantly NSR, <1% ventricular & supraventricular ectopy, no AF, pt triggered episodes   Arrhythmia / AAD Paroxysmal AF > s/p AF ablation 06/03/2020    Risk Assessment/Calculations:    CHA2DS2-VASc Score = 0   This indicates a 0.2% annual risk of stroke. The patient's score is based upon: CHF History: 0 HTN History: 0 Diabetes History: 0 Stroke History: 0 Vascular  Disease History: 0 Age Score: 0 Gender Score: 0              Physical Exam:   VS:  BP 110/68   Pulse (!) 55   Ht 5' 9 (1.753 m)   Wt 225 lb 6.4 oz (102.2 kg)   SpO2 98%   BMI 33.29 kg/m    Wt Readings from Last 3 Encounters:  06/11/23 225 lb 6.4 oz (102.2 kg)  11/22/22 221 lb 6.4 oz (100.4 kg)  06/28/22 229 lb (103.9 kg)     GEN: Well nourished, well developed in no acute distress NECK: No JVD; No carotid bruits CARDIAC: Regular rate and rhythm, no murmurs, rubs, gallops RESPIRATORY:  Clear to auscultation without rales, wheezing or rhonchi  ABDOMEN: Soft, non-tender, non-distended EXTREMITIES:  No edema; No deformity   ASSESSMENT AND PLAN:    Paroxysmal Atrial Fibrillation  CHA2DS2-VASc 0. S/p PVI ablation 05/2020 -not on medications at baseline -monitors by self checking his pulse -encouraged patient to call if he has new symptoms   Palpitations  Prior monitor with rare PAC/PVC's.  -minimal symptom burden    OSA  -CPAP compliance encouraged    Follow up with Dr. Norton or EP APP  9 months   Signed, Jarvis Aniceto, MSN, APRN, NP-C, AGACNP-BC [facility] - Electrophysiology  06/11/2023, 8:56 AM

## 2023-06-11 ENCOUNTER — Encounter: Payer: Self-pay | Admitting: Pulmonary Disease

## 2023-06-11 ENCOUNTER — Ambulatory Visit: Payer: Medicare Other | Attending: Pulmonary Disease | Admitting: Pulmonary Disease

## 2023-06-11 VITALS — BP 110/68 | HR 55 | Ht 69.0 in | Wt 225.4 lb

## 2023-06-11 DIAGNOSIS — I480 Paroxysmal atrial fibrillation: Secondary | ICD-10-CM | POA: Diagnosis not present

## 2023-06-11 NOTE — Patient Instructions (Signed)
 Medication Instructions:  Your physician recommends that you continue on your current medications as directed. Please refer to the Current Medication list given to you today.  *If you need a refill on your cardiac medications before your next appointment, please call your pharmacy*  Lab Work: None ordered.  If you have labs (blood work) drawn today and your tests are completely normal, you will receive your results only by: MyChart Message (if you have MyChart) OR A paper copy in the mail If you have any lab test that is abnormal or we need to change your treatment, we will call you to review the results.  Testing/Procedures: None ordered.  Follow-Up: At [facility], you and your health needs are our priority.  As part of our continuing mission to provide you with exceptional heart care, we have created designated Provider Care Teams.  These Care Teams include your primary Cardiologist (physician) and Advanced Practice Providers (APPs -  Physician Assistants and Nurse Practitioners) who all work together to provide you with the care you need, when you need it.   Your next appointment:   1 year(s)  The format for your next appointment:   In Person  Provider:   Dr Camnitz{or one of the following Advanced Practice Providers on your designated Care Team:    Leotis Barrack, NP    Important Information About Sugar

## 2024-01-28 ENCOUNTER — Telehealth: Payer: Self-pay | Admitting: Pulmonary Disease

## 2024-01-28 NOTE — Telephone Encounter (Signed)
 Patient c/o Palpitations:  STAT if patient reporting lightheadedness, shortness of breath, or chest pain  How long have you had palpitations/irregular HR/ Afib? Are you having the symptoms now? Palpitations for about 2-3 months   Are you currently experiencing lightheadedness, SOB or CP?  No   Do you have a history of afib (atrial fibrillation) or irregular heart rhythm? No   Have you checked your BP or HR? (document readings if available): hr usually in the 90's and states his bp is usually good. He was in the car at the time of call so unable to give any readings.   Are you experiencing any other symptoms?  No, he states he just doesn't feel well. He states he has had covid and he is not getting over it. He states when his heart flutters he has to cough. He asked if he can wear a heart monitor

## 2024-01-28 NOTE — Telephone Encounter (Signed)
 Patient identification verified by 2 forms.   Called and spoke to patient  Patient states:  -Heart beats fast at times -Wants to wear a monitor  -Symptoms daily (flutter, stop, skipped beat), lightheaded and SOB at times.  Patient denies:  -Having blood pressure readings with him to give at the moment.   Interventions/Plan: -Will get message to [facility] for advise. (Was offered monitor at visit, willing to wear it now).  -Appt made for next Thursday   Patient agrees with plan, no questions at this time

## 2024-01-30 ENCOUNTER — Other Ambulatory Visit: Payer: Self-pay

## 2024-01-30 ENCOUNTER — Ambulatory Visit: Attending: Pulmonary Disease

## 2024-01-30 DIAGNOSIS — I4891 Unspecified atrial fibrillation: Secondary | ICD-10-CM

## 2024-01-30 NOTE — Telephone Encounter (Signed)
 Monitor ordered. MyChart message sent to pt with instructions.

## 2024-01-30 NOTE — Progress Notes (Unsigned)
 Camnitz to read

## 2024-01-30 NOTE — Progress Notes (Signed)
 14 day Zio ordered per Daphne Barrack

## 2024-02-04 NOTE — Progress Notes (Unsigned)
  Electrophysiology Office Note:   Date:  02/04/2024  ID:  SATYA BUTTRAM, DOB 18-Oct-1970, MRN [id number]  Primary Cardiologist: None Primary Heart Failure: None Electrophysiologist: Will Gladis Norton, MD  {Click to update primary MD,subspecialty MD or APP then REFRESH:1}    History of Present Illness:   [name] is a 53 y.o. male with h/o paroxysmal AF s/p ablation 05/2020, OSA, obesity  seen today for acute visit due to palpitations, lightheadedness and shortness of breath.    Pt called the clinic on 9/2 with reports of fast heart rates, daily symptoms of fluttering, skipped beats, lightheadedness and short of breath a times. Cardiac Monitor was ordered & pending.   Patient reports ***.    He denies chest pain, palpitations, dyspnea, PND, orthopnea, nausea, vomiting, dizziness, syncope, edema, weight gain, or early satiety.   Review of systems complete and found to be negative unless listed in HPI.   EP Information / Studies Reviewed:    EKG is ordered today. Personal review as below.      Studies:  EPS 05/2020 > PVI ablation & ablation of second discrete focus  Cardiac Monitor 09/2021 > predominantly NSR, <1% ventricular & supraventricular ectopy, no AF, pt triggered episodes   Arrhythmia / AAD Paroxysmal AF > s/p AF ablation 06/03/2020   Risk Assessment/Calculations:    CHA2DS2-VASc Score = 0  {Confirm score is correct.  If not, click here to update score.  REFRESH note.  :1} This indicates a 0.2% annual risk of stroke. The patient's score is based upon: CHF History: 0 HTN History: 0 Diabetes History: 0 Stroke History: 0 Vascular Disease History: 0 Age Score: 0 Gender Score: 0     No BP recorded.  {Refresh Note OR Click here to enter BP  :1}***        Physical Exam:   VS:  There were no vitals taken for this visit.   Wt Readings from Last 3 Encounters:  06/11/23 225 lb 6.4 oz (102.2 kg)  11/22/22 221 lb 6.4 oz (100.4 kg)  06/28/22 229 lb (103.9 kg)      GEN: Well nourished, well developed in no acute distress NECK: No JVD; No carotid bruits CARDIAC: {EPRHYTHM:28826}, no murmurs, rubs, gallops RESPIRATORY:  Clear to auscultation without rales, wheezing or rhonchi  ABDOMEN: Soft, non-tender, non-distended EXTREMITIES:  No edema; No deformity   ASSESSMENT AND PLAN:    Palpitations  Prior monitor with rare PAC's / PVC's -cardiac monitor pending  -EKG with ***  -if recurrent AF, could consider flecainide + BB in short term and get back to Dr. Norton for consideration of redo ablation ***  Paroxysmal Atrial Fibrillation  CHA2DS2-VASc 0, s/p PVI ablation 05/2020. CCS 0 on CT.  -not on medications at baseline  -previously monitored HR by checking his pulse -not on OAC with low risk score   OSA  -CPAP compliance encouraged    Follow up with Dr. Norton {EPFOLLOW LE:71826}  Signed, Daphne Barrack, NP-C, AGACNP-BC [facility] - Electrophysiology  02/04/2024, 8:04 AM

## 2024-02-06 ENCOUNTER — Ambulatory Visit: Attending: Pulmonary Disease | Admitting: Pulmonary Disease

## 2024-02-06 ENCOUNTER — Encounter: Payer: Self-pay | Admitting: Pulmonary Disease

## 2024-02-06 VITALS — BP 119/79 | HR 70 | Ht 69.0 in | Wt 207.0 lb

## 2024-02-06 DIAGNOSIS — G4733 Obstructive sleep apnea (adult) (pediatric): Secondary | ICD-10-CM

## 2024-02-06 DIAGNOSIS — R002 Palpitations: Secondary | ICD-10-CM | POA: Diagnosis not present

## 2024-02-06 DIAGNOSIS — I480 Paroxysmal atrial fibrillation: Secondary | ICD-10-CM | POA: Diagnosis not present

## 2024-02-06 NOTE — Patient Instructions (Signed)
 Thank you for choosing [facility]!     Medication Instructions:  No medication changes were made during today's visit.  *If you need a refill on your cardiac medications before your next appointment, please call your pharmacy*   Lab Work: No labs were ordered during today's visit.  If you have labs (blood work) drawn today and your tests are completely normal, you will receive your results only by: MyChart Message (if you have MyChart) OR A paper copy in the mail If you have any lab test that is abnormal or we need to change your treatment, we will call you to review the results.   Testing/Procedures: No procedures were ordered during today's visit.    Follow-Up: At [facility], you and your health needs are our priority.  As part of our continuing mission to provide you with exceptional heart care, we have created designated Provider Care Teams.  These Care Teams include your primary Cardiologist (physician) and Advanced Practice Providers (APPs -  Physician Assistants and Nurse Practitioners) who all work together to provide you with the care you need, when you need it. We recommend signing up for the patient portal called MyChart.  Sign up information is provided on this After Visit Summary.  MyChart is used to connect with patients for Virtual Visits (Telemedicine).  Patients are able to view lab/test results, encounter notes, upcoming appointments, etc.  Non-urgent messages can be sent to your provider as well.   To learn more about what you can do with MyChart, go to [email or web address].

## 2024-03-02 DIAGNOSIS — I4891 Unspecified atrial fibrillation: Secondary | ICD-10-CM

## 2024-03-04 ENCOUNTER — Other Ambulatory Visit: Payer: Self-pay | Admitting: Pulmonary Disease

## 2024-03-04 ENCOUNTER — Ambulatory Visit: Payer: Self-pay | Admitting: Pulmonary Disease

## 2024-03-04 MED ORDER — METOPROLOL SUCCINATE ER 25 MG PO TB24: 25.0000 mg | ORAL_TABLET | Freq: Every day | ORAL | 1 refills | Status: DC

## 2024-03-13 ENCOUNTER — Telehealth: Payer: Self-pay | Admitting: Pulmonary Disease

## 2024-03-13 MED ORDER — METOPROLOL SUCCINATE ER 25 MG PO TB24: 25.0000 mg | ORAL_TABLET | Freq: Every day | ORAL | 3 refills | Status: AC

## 2024-03-13 NOTE — Telephone Encounter (Signed)
90 day sent to pharmacy 

## 2024-03-13 NOTE — Telephone Encounter (Signed)
*  STAT* If patient is at the pharmacy, call can be transferred to refill team.   1. Which medications need to be refilled? (please list name of each medication and dose if known) metoprolol  succinate (TOPROL  XL) 25 MG 24 hr tablet   2. Which pharmacy/location (including street and city if local pharmacy) is medication to be sent to? [id number] - [place], [place] - [address]   3. Do they need a 30 day or 90 day supply?  Patient would like to know if his prescription can be increased from 30 to 90 day supply.

## 2024-04-03 NOTE — Progress Notes (Signed)
 Electrophysiology Office Note:   Date:  04/14/2024  ID:  [name], DOB 05-Mar-1971, MRN [id number]  Primary Cardiologist: None Primary Heart Failure: None Electrophysiologist: Will Gladis Norton, MD      History of Present Illness:   [name] is a 53 y.o. male with h/o paroxysmal AF s/p ablation 05/2020, OSA, obesity  seen today for routine electrophysiology followup.   Since last being seen in our clinic the patient reports he has been having ongoing issues with his heart skipping a beat.  He states he only started the Toprol  2 weeks ago and is only taking half the prescribed dose.  He reports he notes increased palpitations when he eats he has more palpitations and when he bends over.  He states these symptoms pre-date Toprol .  He reports having multiple issues skin tingling and his feet being cold.  He states he is being referred to Rheumatology for evaluation. He states his dermatologist has prescribed him Ivermectin for his rash and that autoimmune diseases are thought to be parasites. He states there are lots of studies about the benefits of ivermectin and how he is considering going to a holistic MD to help with his symptoms. He wears his CPAP consistently.   He denies chest pain, dyspnea, PND, orthopnea, nausea, vomiting, dizziness, syncope, edema, weight gain, or early satiety.   Review of systems complete and found to be negative unless listed in HPI.   EP Information / Studies Reviewed:    EKG is not ordered today. EKG from 02/06/24 reviewed which showed NSR 70 bpm      Arrhythmia / AAD / Pertinent EP Studies Paroxysmal AF > s/p AF ablation 06/03/2020  EPS 05/2020 > PVI ablation & ablation of second discrete focus  Cardiac Monitor 09/2021 > predominantly NSR, <1% ventricular & supraventricular ectopy, no AF, pt triggered episodes Cardiac Monitor 01/2024 > NSR predominant, no AF, rare ectopy, 49 non-sustained SVT episodes (17 beats longest), 2 non-sustained VT  (longest 7 beats)  Risk Assessment/Calculations:    CHA2DS2-VASc Score = 0   This indicates a 0.2% annual risk of stroke. The patient's score is based upon: CHF History: 0 HTN History: 0 Diabetes History: 0 Stroke History: 0 Vascular Disease History: 0 Age Score: 0 Gender Score: 0              Physical Exam:   VS:  BP 108/80 (BP Location: Right Arm, Patient Position: Sitting, Cuff Size: Normal)   Pulse 74   Ht 5' 9 (1.753 m)   Wt 207 lb 3.2 oz (94 kg)   SpO2 97%   BMI 30.60 kg/m    Wt Readings from Last 3 Encounters:  04/14/24 207 lb 3.2 oz (94 kg)  02/06/24 207 lb (93.9 kg)  06/11/23 225 lb 6.4 oz (102.2 kg)     GEN: Well nourished, well developed in no acute distress HENT: No JVD; No carotid bruits, facial rash in distrubution pattern of CPAP mask / rosacea like in appearance CARDIAC: Regular rate and rhythm, no murmurs, rubs, gallops RESPIRATORY:  Clear to auscultation without rales, wheezing or rhonchi  ABDOMEN: Soft, non-tender, non-distended EXTREMITIES:  No edema; No deformity   ASSESSMENT AND PLAN:    Palpitations  -recent monitor with non-sustained SVT, VT. No AF  -discussed taking prescribed dosing of Toprol  25 mg daily for palpitations -reviewed possible transition to cardizem  if he is not comfortable taking Toprol  for SVT/palpitations  -low burden on recent device, reviewed if increased symptoms, could consider EPS -cautioned  him on herbal supplements and hx of AF (no notes from derm regarding script for ivermectin).   -follow up in 2 months to review symptoms on increased dose Toprol  / tolerance   Paroxysmal Atrial Fibrillation  CHA2DS2-VASc 0, s/p PVI ablation 05/2020. CCS 0 on CT  -no AF on recent monitor  -not on OAC with no burden / los risk score   OSA  -compliant with CPAP   -discussed washing mask / mask hygiene with facial rash   Follow up with Dr. Inocencio 2 months    Signed, Daphne Barrack, NP-C, AGACNP-BC [facility] -  Electrophysiology  04/14/2024, 12:46 PM

## 2024-04-14 ENCOUNTER — Ambulatory Visit: Attending: Pulmonary Disease | Admitting: Pulmonary Disease

## 2024-04-14 ENCOUNTER — Encounter: Payer: Self-pay | Admitting: Pulmonary Disease

## 2024-04-14 VITALS — BP 108/80 | HR 74 | Ht 69.0 in | Wt 207.2 lb

## 2024-04-14 DIAGNOSIS — R002 Palpitations: Secondary | ICD-10-CM | POA: Diagnosis not present

## 2024-04-14 DIAGNOSIS — G4733 Obstructive sleep apnea (adult) (pediatric): Secondary | ICD-10-CM

## 2024-04-14 DIAGNOSIS — I480 Paroxysmal atrial fibrillation: Secondary | ICD-10-CM | POA: Diagnosis not present

## 2024-04-14 NOTE — Patient Instructions (Signed)
 Medication Instructions:  Increase Toprol  to full tablet daily > 25 mg daily    *If you need a refill on your cardiac medications before your next appointment, please call your pharmacy*  Lab Work: No lab work today If you have labs (blood work) drawn today and your tests are completely normal, you will receive your results only by: MyChart Message (if you have MyChart) OR A paper copy in the mail If you have any lab test that is abnormal or we need to change your treatment, we will call you to review the results.  Testing/Procedures: No testing/procedures were scheduled today  Follow-Up: At [facility], you and your health needs are our priority.  As part of our continuing mission to provide you with exceptional heart care, our providers are all part of one team.  This team includes your primary Cardiologist (physician) and Advanced Practice Providers or APPs (Physician Assistants and Nurse Practitioners) who all work together to provide you with the care you need, when you need it.  Your next appointment:   2 month(s)  Provider:   You may see Will Gladis Norton, MD or one of the following Advanced Practice Providers on your designated Care Team:    Daphne Barrack, NP    We recommend signing up for the patient portal called MyChart.  Sign up information is provided on this After Visit Summary.  MyChart is used to connect with patients for Virtual Visits (Telemedicine).  Patients are able to view lab/test results, encounter notes, upcoming appointments, etc.  Non-urgent messages can be sent to your provider as well.   To learn more about what you can do with MyChart, go to [email or web address].

## 2024-06-09 NOTE — Progress Notes (Unsigned)
 This encounter was created in error - please disregard.

## 2024-06-11 ENCOUNTER — Ambulatory Visit: Admitting: Pulmonary Disease

## 2024-08-18 ENCOUNTER — Ambulatory Visit: Admitting: Pulmonary Disease
